# Patient Record
Sex: Male | Born: 1935
Health system: Southern US, Community
[De-identification: ages and names within clinical notes are randomized; demographics above are authoritative.]

## PROBLEM LIST (undated history)

## (undated) DIAGNOSIS — T7840XA Allergy, unspecified, initial encounter: Secondary | ICD-10-CM

## (undated) DIAGNOSIS — N183 Chronic kidney disease, stage 3 unspecified: Secondary | ICD-10-CM

## (undated) DIAGNOSIS — M1990 Unspecified osteoarthritis, unspecified site: Secondary | ICD-10-CM

## (undated) DIAGNOSIS — I499 Cardiac arrhythmia, unspecified: Secondary | ICD-10-CM

## (undated) DIAGNOSIS — K222 Esophageal obstruction: Secondary | ICD-10-CM

## (undated) DIAGNOSIS — I10 Essential (primary) hypertension: Secondary | ICD-10-CM

## (undated) DIAGNOSIS — D649 Anemia, unspecified: Secondary | ICD-10-CM

## (undated) DIAGNOSIS — H9190 Unspecified hearing loss, unspecified ear: Secondary | ICD-10-CM

## (undated) DIAGNOSIS — E785 Hyperlipidemia, unspecified: Secondary | ICD-10-CM

## (undated) DIAGNOSIS — IMO0002 Reserved for concepts with insufficient information to code with codable children: Secondary | ICD-10-CM

## (undated) DIAGNOSIS — Z8719 Personal history of other diseases of the digestive system: Secondary | ICD-10-CM

## (undated) DIAGNOSIS — C4491 Basal cell carcinoma of skin, unspecified: Secondary | ICD-10-CM

## (undated) DIAGNOSIS — K649 Unspecified hemorrhoids: Secondary | ICD-10-CM

## (undated) DIAGNOSIS — Z5189 Encounter for other specified aftercare: Secondary | ICD-10-CM

## (undated) DIAGNOSIS — I2510 Atherosclerotic heart disease of native coronary artery without angina pectoris: Secondary | ICD-10-CM

## (undated) DIAGNOSIS — J309 Allergic rhinitis, unspecified: Secondary | ICD-10-CM

## (undated) DIAGNOSIS — H5789 Other specified disorders of eye and adnexa: Secondary | ICD-10-CM

## (undated) DIAGNOSIS — R55 Syncope and collapse: Secondary | ICD-10-CM

## (undated) DIAGNOSIS — R011 Cardiac murmur, unspecified: Secondary | ICD-10-CM

## (undated) DIAGNOSIS — K8020 Calculus of gallbladder without cholecystitis without obstruction: Secondary | ICD-10-CM

## (undated) DIAGNOSIS — R112 Nausea with vomiting, unspecified: Secondary | ICD-10-CM

## (undated) DIAGNOSIS — Z936 Other artificial openings of urinary tract status: Secondary | ICD-10-CM

## (undated) DIAGNOSIS — I739 Peripheral vascular disease, unspecified: Secondary | ICD-10-CM

## (undated) DIAGNOSIS — I6529 Occlusion and stenosis of unspecified carotid artery: Secondary | ICD-10-CM

## (undated) DIAGNOSIS — T7500XA Unspecified effects of lightning, initial encounter: Secondary | ICD-10-CM

## (undated) DIAGNOSIS — K635 Polyp of colon: Secondary | ICD-10-CM

## (undated) DIAGNOSIS — C4490 Unspecified malignant neoplasm of skin, unspecified: Secondary | ICD-10-CM

## (undated) DIAGNOSIS — R946 Abnormal results of thyroid function studies: Secondary | ICD-10-CM

## (undated) DIAGNOSIS — C679 Malignant neoplasm of bladder, unspecified: Secondary | ICD-10-CM

## (undated) DIAGNOSIS — J302 Other seasonal allergic rhinitis: Secondary | ICD-10-CM

## (undated) DIAGNOSIS — H269 Unspecified cataract: Secondary | ICD-10-CM

## (undated) DIAGNOSIS — Z9889 Other specified postprocedural states: Secondary | ICD-10-CM

## (undated) DIAGNOSIS — I350 Nonrheumatic aortic (valve) stenosis: Secondary | ICD-10-CM

## (undated) DIAGNOSIS — K219 Gastro-esophageal reflux disease without esophagitis: Secondary | ICD-10-CM

## (undated) DIAGNOSIS — F419 Anxiety disorder, unspecified: Secondary | ICD-10-CM

## (undated) DIAGNOSIS — R42 Dizziness and giddiness: Secondary | ICD-10-CM

## (undated) DIAGNOSIS — M86171 Other acute osteomyelitis, right ankle and foot: Secondary | ICD-10-CM

## (undated) DIAGNOSIS — N400 Enlarged prostate without lower urinary tract symptoms: Secondary | ICD-10-CM

## (undated) HISTORY — DX: Esophageal obstruction: K22.2

## (undated) HISTORY — DX: Unspecified hearing loss, unspecified ear: H91.90

## (undated) HISTORY — DX: Allergic rhinitis, unspecified: J30.9

## (undated) HISTORY — PX: PROSTATE SURGERY: SHX751

## (undated) HISTORY — DX: Reserved for concepts with insufficient information to code with codable children: IMO0002

## (undated) HISTORY — DX: Unspecified osteoarthritis, unspecified site: M19.90

## (undated) HISTORY — DX: Encounter for other specified aftercare: Z51.89

## (undated) HISTORY — DX: Occlusion and stenosis of unspecified carotid artery: I65.29

## (undated) HISTORY — DX: Peripheral vascular disease, unspecified: I73.9

## (undated) HISTORY — DX: Essential (primary) hypertension: I10

## (undated) HISTORY — DX: Benign prostatic hyperplasia without lower urinary tract symptoms: N40.0

## (undated) HISTORY — DX: Allergy, unspecified, initial encounter: T78.40XA

## (undated) HISTORY — DX: Gastro-esophageal reflux disease without esophagitis: K21.9

## (undated) HISTORY — DX: Abnormal results of thyroid function studies: R94.6

## (undated) HISTORY — DX: Calculus of gallbladder without cholecystitis without obstruction: K80.20

## (undated) HISTORY — DX: Hyperlipidemia, unspecified: E78.5

## (undated) HISTORY — DX: Unspecified effects of lightning, initial encounter: T75.00XA

## (undated) HISTORY — DX: Anxiety disorder, unspecified: F41.9

## (undated) HISTORY — PX: SKIN CANCER EXCISION: SHX779

## (undated) HISTORY — PX: COLONOSCOPY W/ BIOPSIES: SHX1374

## (undated) HISTORY — DX: Basal cell carcinoma of skin, unspecified: C44.91

## (undated) HISTORY — PX: ESOPHAGOGASTRODUODENOSCOPY: SHX1529

## (undated) HISTORY — DX: Polyp of colon: K63.5

---

## 1979-04-06 HISTORY — PX: BACK SURGERY: SHX140

## 1981-04-05 HISTORY — PX: CORONARY ARTERY BYPASS GRAFT: SHX141

## 1997-09-25 ENCOUNTER — Other Ambulatory Visit: Admission: RE | Admit: 1997-09-25 | Discharge: 1997-09-25 | Payer: Self-pay | Admitting: Urology

## 1998-11-04 DIAGNOSIS — T7500XA Unspecified effects of lightning, initial encounter: Secondary | ICD-10-CM

## 1998-11-04 HISTORY — DX: Unspecified effects of lightning, initial encounter: T75.00XA

## 1999-11-04 HISTORY — PX: TRANSURETHRAL NEEDLE ABLATION OF THE PROSTATE: SHX2574

## 1999-11-12 ENCOUNTER — Ambulatory Visit (HOSPITAL_BASED_OUTPATIENT_CLINIC_OR_DEPARTMENT_OTHER): Admission: RE | Admit: 1999-11-12 | Discharge: 1999-11-13 | Payer: Self-pay | Admitting: Urology

## 2002-09-04 ENCOUNTER — Encounter: Payer: Self-pay | Admitting: Family Medicine

## 2002-09-04 LAB — CONVERTED CEMR LAB: PSA: 1 ng/mL

## 2003-10-04 ENCOUNTER — Encounter: Payer: Self-pay | Admitting: Family Medicine

## 2003-10-04 LAB — CONVERTED CEMR LAB: PSA: 0.9 ng/mL

## 2004-03-04 ENCOUNTER — Ambulatory Visit: Payer: Self-pay | Admitting: Family Medicine

## 2004-04-16 ENCOUNTER — Ambulatory Visit: Payer: Self-pay | Admitting: Family Medicine

## 2004-10-03 ENCOUNTER — Encounter: Payer: Self-pay | Admitting: Family Medicine

## 2004-10-03 LAB — CONVERTED CEMR LAB: PSA: 0.93 ng/mL

## 2004-10-13 ENCOUNTER — Ambulatory Visit: Payer: Self-pay | Admitting: Family Medicine

## 2004-10-16 ENCOUNTER — Ambulatory Visit: Payer: Self-pay | Admitting: Family Medicine

## 2004-10-27 ENCOUNTER — Ambulatory Visit: Payer: Self-pay | Admitting: Family Medicine

## 2005-02-04 ENCOUNTER — Ambulatory Visit: Payer: Self-pay | Admitting: Family Medicine

## 2005-04-20 ENCOUNTER — Ambulatory Visit: Payer: Self-pay | Admitting: Family Medicine

## 2005-06-01 ENCOUNTER — Ambulatory Visit: Payer: Self-pay | Admitting: Family Medicine

## 2005-06-17 ENCOUNTER — Ambulatory Visit: Payer: Self-pay | Admitting: Family Medicine

## 2005-07-27 ENCOUNTER — Ambulatory Visit: Payer: Self-pay | Admitting: Family Medicine

## 2005-11-22 ENCOUNTER — Ambulatory Visit: Payer: Self-pay | Admitting: Family Medicine

## 2005-11-24 ENCOUNTER — Ambulatory Visit: Payer: Self-pay | Admitting: Family Medicine

## 2005-12-09 ENCOUNTER — Ambulatory Visit: Payer: Self-pay | Admitting: Family Medicine

## 2006-01-04 ENCOUNTER — Ambulatory Visit: Payer: Self-pay | Admitting: Family Medicine

## 2006-05-16 ENCOUNTER — Ambulatory Visit: Payer: Self-pay | Admitting: Family Medicine

## 2006-05-16 LAB — CONVERTED CEMR LAB
ALT: 15 units/L (ref 0–40)
AST: 26 units/L (ref 0–37)
Alkaline Phosphatase: 48 units/L (ref 39–117)
Basophils Relative: 0.5 % (ref 0.0–1.0)
Bilirubin, Direct: 0.1 mg/dL (ref 0.0–0.3)
CO2: 29 meq/L (ref 19–32)
Calcium: 9.2 mg/dL (ref 8.4–10.5)
Chloride: 100 meq/L (ref 96–112)
Eosinophils Absolute: 0.3 10*3/uL (ref 0.0–0.6)
Eosinophils Relative: 4.6 % (ref 0.0–5.0)
GFR calc non Af Amer: 49 mL/min
Glucose, Bld: 103 mg/dL — ABNORMAL HIGH (ref 70–99)
Platelets: 257 10*3/uL (ref 150–400)
RBC: 5.11 M/uL (ref 4.22–5.81)
WBC: 6.9 10*3/uL (ref 4.5–10.5)

## 2006-05-23 ENCOUNTER — Ambulatory Visit: Payer: Self-pay | Admitting: Family Medicine

## 2006-05-30 ENCOUNTER — Ambulatory Visit: Payer: Self-pay | Admitting: Cardiology

## 2006-06-07 ENCOUNTER — Ambulatory Visit: Payer: Self-pay

## 2006-06-07 LAB — CONVERTED CEMR LAB
ALT: 18 units/L (ref 0–40)
Albumin: 4.1 g/dL (ref 3.5–5.2)
Alkaline Phosphatase: 50 units/L (ref 39–117)
Total CHOL/HDL Ratio: 3.9
Total Protein: 7 g/dL (ref 6.0–8.3)

## 2006-06-28 ENCOUNTER — Ambulatory Visit: Payer: Self-pay | Admitting: Cardiology

## 2006-06-29 ENCOUNTER — Ambulatory Visit: Payer: Self-pay | Admitting: Family Medicine

## 2006-07-05 ENCOUNTER — Ambulatory Visit: Payer: Self-pay | Admitting: Cardiology

## 2006-08-23 ENCOUNTER — Ambulatory Visit: Payer: Self-pay | Admitting: Family Medicine

## 2006-08-23 DIAGNOSIS — I25810 Atherosclerosis of coronary artery bypass graft(s) without angina pectoris: Secondary | ICD-10-CM

## 2006-08-23 DIAGNOSIS — I6529 Occlusion and stenosis of unspecified carotid artery: Secondary | ICD-10-CM

## 2006-11-22 ENCOUNTER — Encounter: Payer: Self-pay | Admitting: Family Medicine

## 2006-11-22 DIAGNOSIS — R7309 Other abnormal glucose: Secondary | ICD-10-CM

## 2006-11-22 DIAGNOSIS — J309 Allergic rhinitis, unspecified: Secondary | ICD-10-CM | POA: Insufficient documentation

## 2006-11-22 DIAGNOSIS — I10 Essential (primary) hypertension: Secondary | ICD-10-CM

## 2006-11-22 DIAGNOSIS — F411 Generalized anxiety disorder: Secondary | ICD-10-CM | POA: Insufficient documentation

## 2006-11-22 DIAGNOSIS — K8020 Calculus of gallbladder without cholecystitis without obstruction: Secondary | ICD-10-CM | POA: Insufficient documentation

## 2006-11-22 DIAGNOSIS — IMO0002 Reserved for concepts with insufficient information to code with codable children: Secondary | ICD-10-CM

## 2006-11-22 DIAGNOSIS — N400 Enlarged prostate without lower urinary tract symptoms: Secondary | ICD-10-CM

## 2006-11-22 DIAGNOSIS — E785 Hyperlipidemia, unspecified: Secondary | ICD-10-CM

## 2006-11-30 ENCOUNTER — Ambulatory Visit: Payer: Self-pay | Admitting: Family Medicine

## 2006-12-01 LAB — CONVERTED CEMR LAB
ALT: 17 units/L (ref 0–53)
Alkaline Phosphatase: 44 units/L (ref 39–117)
BUN: 15 mg/dL (ref 6–23)
Bilirubin, Direct: 0.1 mg/dL (ref 0.0–0.3)
Calcium: 9.8 mg/dL (ref 8.4–10.5)
Cholesterol: 175 mg/dL (ref 0–200)
Eosinophils Absolute: 0.3 10*3/uL (ref 0.0–0.6)
GFR calc Af Amer: 55 mL/min
GFR calc non Af Amer: 45 mL/min
HDL: 40.3 mg/dL (ref >39.0)
Hemoglobin: 15 g/dL (ref 13.0–17.0)
Lymphocytes Relative: 28 % (ref 12.0–46.0)
MCHC: 33.8 g/dL (ref 30.0–36.0)
MCV: 88.7 fL (ref 78.0–100.0)
Microalb Creat Ratio: 2.8 mg/g (ref 0.0–30.0)
Microalb, Ur: 0.3 mg/dL (ref 0.0–1.9)
Monocytes Absolute: 0.6 10*3/uL (ref 0.2–0.7)
Monocytes Relative: 8.9 % (ref 3.0–11.0)
Neutro Abs: 3.6 10*3/uL (ref 1.4–7.7)
PSA: 1.21 ng/mL (ref 0.10–4.00)
Platelets: 263 10*3/uL (ref 150–400)
Potassium: 4.7 meq/L (ref 3.5–5.1)
TSH: 3.96 microintl units/mL (ref 0.35–5.50)
Total Protein: 7.1 g/dL (ref 6.0–8.3)
VLDL: 71 mg/dL — ABNORMAL HIGH (ref 0–40)

## 2006-12-02 ENCOUNTER — Ambulatory Visit: Payer: Self-pay | Admitting: Family Medicine

## 2006-12-15 ENCOUNTER — Ambulatory Visit: Payer: Self-pay | Admitting: Family Medicine

## 2006-12-28 ENCOUNTER — Ambulatory Visit: Payer: Self-pay | Admitting: Family Medicine

## 2007-01-02 ENCOUNTER — Encounter (INDEPENDENT_AMBULATORY_CARE_PROVIDER_SITE_OTHER): Payer: Self-pay | Admitting: *Deleted

## 2007-01-03 ENCOUNTER — Ambulatory Visit: Payer: Self-pay

## 2007-01-03 ENCOUNTER — Encounter: Payer: Self-pay | Admitting: Family Medicine

## 2007-01-20 ENCOUNTER — Ambulatory Visit: Payer: Self-pay | Admitting: Family Medicine

## 2007-01-23 ENCOUNTER — Ambulatory Visit: Payer: Self-pay | Admitting: Cardiology

## 2007-01-30 ENCOUNTER — Telehealth: Payer: Self-pay | Admitting: Family Medicine

## 2007-02-24 ENCOUNTER — Telehealth (INDEPENDENT_AMBULATORY_CARE_PROVIDER_SITE_OTHER): Payer: Self-pay | Admitting: *Deleted

## 2007-03-01 ENCOUNTER — Encounter: Payer: Self-pay | Admitting: Family Medicine

## 2007-05-11 ENCOUNTER — Ambulatory Visit: Payer: Self-pay | Admitting: Family Medicine

## 2007-05-11 LAB — CONVERTED CEMR LAB
BUN: 23 mg/dL (ref 6–23)
CO2: 29 meq/L (ref 19–32)
Creatinine, Ser: 1.5 mg/dL (ref 0.4–1.5)
GFR calc Af Amer: 59 mL/min
Potassium: 4.3 meq/L (ref 3.5–5.1)
Sodium: 139 meq/L (ref 135–145)

## 2007-05-15 ENCOUNTER — Ambulatory Visit: Payer: Self-pay | Admitting: Family Medicine

## 2007-07-11 ENCOUNTER — Ambulatory Visit: Payer: Self-pay

## 2007-07-28 ENCOUNTER — Ambulatory Visit: Payer: Self-pay | Admitting: Cardiology

## 2007-08-09 ENCOUNTER — Ambulatory Visit: Payer: Self-pay

## 2007-08-09 ENCOUNTER — Encounter: Payer: Self-pay | Admitting: Family Medicine

## 2007-11-16 ENCOUNTER — Ambulatory Visit: Payer: Self-pay | Admitting: Family Medicine

## 2007-11-18 LAB — CONVERTED CEMR LAB
ALT: 19 units/L (ref 0–53)
AST: 30 units/L (ref 0–37)
Basophils Absolute: 0 10*3/uL (ref 0.0–0.1)
Bilirubin, Direct: 0.1 mg/dL (ref 0.0–0.3)
CO2: 30 meq/L (ref 19–32)
Chloride: 105 meq/L (ref 96–112)
Eosinophils Relative: 4.5 % (ref 0.0–5.0)
HCT: 43.3 % (ref 39.0–52.0)
Hemoglobin: 14.8 g/dL (ref 13.0–17.0)
MCV: 91 fL (ref 78.0–100.0)
Monocytes Relative: 7.5 % (ref 3.0–12.0)
Potassium: 4.5 meq/L (ref 3.5–5.1)
Sodium: 141 meq/L (ref 135–145)
Total Bilirubin: 0.9 mg/dL (ref 0.3–1.2)
Total CHOL/HDL Ratio: 3.8
Triglycerides: 256 mg/dL (ref 0–149)
WBC: 6.9 10*3/uL (ref 4.5–10.5)

## 2007-11-20 ENCOUNTER — Telehealth: Payer: Self-pay | Admitting: Family Medicine

## 2007-11-21 ENCOUNTER — Ambulatory Visit: Payer: Self-pay | Admitting: Family Medicine

## 2007-11-30 ENCOUNTER — Ambulatory Visit: Payer: Self-pay | Admitting: Family Medicine

## 2007-11-30 LAB — CONVERTED CEMR LAB: OCCULT 3: NEGATIVE

## 2007-12-04 ENCOUNTER — Encounter (INDEPENDENT_AMBULATORY_CARE_PROVIDER_SITE_OTHER): Payer: Self-pay | Admitting: *Deleted

## 2008-01-08 ENCOUNTER — Ambulatory Visit: Payer: Self-pay | Admitting: Family Medicine

## 2008-02-07 ENCOUNTER — Encounter: Payer: Self-pay | Admitting: Family Medicine

## 2008-02-07 ENCOUNTER — Ambulatory Visit: Payer: Self-pay

## 2008-05-28 ENCOUNTER — Ambulatory Visit: Payer: Self-pay | Admitting: Family Medicine

## 2008-08-03 DIAGNOSIS — M129 Arthropathy, unspecified: Secondary | ICD-10-CM | POA: Insufficient documentation

## 2008-08-03 DIAGNOSIS — R011 Cardiac murmur, unspecified: Secondary | ICD-10-CM

## 2008-08-07 ENCOUNTER — Telehealth: Payer: Self-pay | Admitting: Cardiology

## 2008-08-13 ENCOUNTER — Ambulatory Visit: Payer: Self-pay | Admitting: Cardiology

## 2008-08-29 ENCOUNTER — Telehealth (INDEPENDENT_AMBULATORY_CARE_PROVIDER_SITE_OTHER): Payer: Self-pay

## 2008-09-03 ENCOUNTER — Ambulatory Visit: Payer: Self-pay

## 2008-09-03 ENCOUNTER — Encounter: Payer: Self-pay | Admitting: Cardiology

## 2008-09-05 ENCOUNTER — Ambulatory Visit: Payer: Self-pay | Admitting: Cardiology

## 2008-10-09 ENCOUNTER — Ambulatory Visit: Payer: Self-pay | Admitting: Cardiology

## 2008-10-11 LAB — CONVERTED CEMR LAB
Albumin: 3.9 g/dL (ref 3.5–5.2)
Cholesterol: 166 mg/dL (ref 0–200)
HDL: 54.6 mg/dL (ref >39.00)
Total CHOL/HDL Ratio: 3
Total Protein: 6.8 g/dL (ref 6.0–8.3)
VLDL: 50 mg/dL — ABNORMAL HIGH (ref 0.0–40.0)

## 2008-11-20 ENCOUNTER — Ambulatory Visit: Payer: Self-pay | Admitting: Family Medicine

## 2008-11-20 LAB — CONVERTED CEMR LAB
Albumin: 4.1 g/dL (ref 3.5–5.2)
Basophils Absolute: 0 10*3/uL (ref 0.0–0.1)
Basophils Relative: 0.3 % (ref 0.0–3.0)
Cholesterol: 169 mg/dL (ref 0–200)
GFR calc non Af Amer: 41.91 mL/min (ref >60)
HDL: 52.1 mg/dL (ref >39.00)
Hemoglobin: 14.4 g/dL (ref 13.0–17.0)
Lymphocytes Relative: 32.1 % (ref 12.0–46.0)
Microalb Creat Ratio: 3.9 mg/g (ref 0.0–30.0)
Monocytes Relative: 8.5 % (ref 3.0–12.0)
Neutro Abs: 3.3 10*3/uL (ref 1.4–7.7)
PSA: 1.02 ng/mL (ref 0.10–4.00)
Potassium: 4.2 meq/L (ref 3.5–5.1)
RBC: 4.69 M/uL (ref 4.22–5.81)
RDW: 12.9 % (ref 11.5–14.6)
Sodium: 141 meq/L (ref 135–145)
Total Protein: 6.9 g/dL (ref 6.0–8.3)
Triglycerides: 237 mg/dL — ABNORMAL HIGH (ref 0.0–149.0)
VLDL: 47.4 mg/dL — ABNORMAL HIGH (ref 0.0–40.0)
WBC: 6.1 10*3/uL (ref 4.5–10.5)

## 2008-11-21 LAB — CONVERTED CEMR LAB: Vit D, 25-Hydroxy: 17 ng/mL — ABNORMAL LOW (ref 30–89)

## 2008-11-26 ENCOUNTER — Ambulatory Visit: Payer: Self-pay | Admitting: Family Medicine

## 2008-11-26 DIAGNOSIS — H9190 Unspecified hearing loss, unspecified ear: Secondary | ICD-10-CM | POA: Insufficient documentation

## 2008-11-26 DIAGNOSIS — E559 Vitamin D deficiency, unspecified: Secondary | ICD-10-CM | POA: Insufficient documentation

## 2008-12-23 ENCOUNTER — Ambulatory Visit: Payer: Self-pay | Admitting: Family Medicine

## 2008-12-24 ENCOUNTER — Encounter (INDEPENDENT_AMBULATORY_CARE_PROVIDER_SITE_OTHER): Payer: Self-pay | Admitting: *Deleted

## 2008-12-30 ENCOUNTER — Encounter (INDEPENDENT_AMBULATORY_CARE_PROVIDER_SITE_OTHER): Payer: Self-pay | Admitting: *Deleted

## 2009-01-07 ENCOUNTER — Ambulatory Visit: Payer: Self-pay | Admitting: Family Medicine

## 2009-02-20 ENCOUNTER — Encounter (INDEPENDENT_AMBULATORY_CARE_PROVIDER_SITE_OTHER): Payer: Self-pay | Admitting: *Deleted

## 2009-02-25 ENCOUNTER — Ambulatory Visit: Payer: Self-pay | Admitting: Family Medicine

## 2009-02-26 LAB — CONVERTED CEMR LAB: Vit D, 25-Hydroxy: 50 ng/mL (ref 30–89)

## 2009-03-04 ENCOUNTER — Ambulatory Visit: Payer: Self-pay | Admitting: Family Medicine

## 2009-03-21 ENCOUNTER — Ambulatory Visit: Payer: Self-pay | Admitting: Cardiology

## 2009-03-25 ENCOUNTER — Encounter: Payer: Self-pay | Admitting: Cardiology

## 2009-03-26 ENCOUNTER — Encounter: Payer: Self-pay | Admitting: Cardiovascular Disease

## 2009-03-26 ENCOUNTER — Ambulatory Visit: Payer: Self-pay

## 2009-08-06 ENCOUNTER — Ambulatory Visit: Payer: Self-pay | Admitting: Family Medicine

## 2009-08-07 LAB — CONVERTED CEMR LAB: Vit D, 25-Hydroxy: 44 ng/mL (ref 30–89)

## 2009-08-11 ENCOUNTER — Ambulatory Visit: Payer: Self-pay | Admitting: Family Medicine

## 2009-08-11 DIAGNOSIS — R42 Dizziness and giddiness: Secondary | ICD-10-CM | POA: Insufficient documentation

## 2009-08-20 ENCOUNTER — Telehealth: Payer: Self-pay | Admitting: Family Medicine

## 2009-09-29 ENCOUNTER — Encounter: Payer: Self-pay | Admitting: Cardiology

## 2009-09-30 ENCOUNTER — Encounter: Payer: Self-pay | Admitting: Cardiology

## 2009-09-30 ENCOUNTER — Ambulatory Visit: Payer: Self-pay

## 2009-10-08 ENCOUNTER — Telehealth: Payer: Self-pay | Admitting: Family Medicine

## 2009-11-10 ENCOUNTER — Encounter (INDEPENDENT_AMBULATORY_CARE_PROVIDER_SITE_OTHER): Payer: Self-pay | Admitting: *Deleted

## 2010-01-13 ENCOUNTER — Ambulatory Visit: Payer: Self-pay | Admitting: Family Medicine

## 2010-01-20 ENCOUNTER — Ambulatory Visit: Payer: Self-pay | Admitting: Family Medicine

## 2010-01-27 ENCOUNTER — Ambulatory Visit: Payer: Self-pay | Admitting: Family Medicine

## 2010-01-28 DIAGNOSIS — R195 Other fecal abnormalities: Secondary | ICD-10-CM

## 2010-01-29 ENCOUNTER — Encounter: Payer: Self-pay | Admitting: Internal Medicine

## 2010-02-18 ENCOUNTER — Ambulatory Visit: Payer: Self-pay | Admitting: Internal Medicine

## 2010-02-18 ENCOUNTER — Encounter (INDEPENDENT_AMBULATORY_CARE_PROVIDER_SITE_OTHER): Payer: Self-pay | Admitting: *Deleted

## 2010-02-18 DIAGNOSIS — R3129 Other microscopic hematuria: Secondary | ICD-10-CM

## 2010-02-18 DIAGNOSIS — R1319 Other dysphagia: Secondary | ICD-10-CM

## 2010-02-23 ENCOUNTER — Ambulatory Visit: Payer: Self-pay | Admitting: Internal Medicine

## 2010-02-26 ENCOUNTER — Encounter: Payer: Self-pay | Admitting: Internal Medicine

## 2010-03-03 ENCOUNTER — Ambulatory Visit: Payer: Self-pay | Admitting: Family Medicine

## 2010-03-05 LAB — CONVERTED CEMR LAB
AST: 25 units/L (ref 0–37)
Alkaline Phosphatase: 58 units/L (ref 39–117)
Bilirubin, Direct: 0.1 mg/dL (ref 0.0–0.3)
Direct LDL: 101.6 mg/dL
GFR calc non Af Amer: 41.21 mL/min (ref >60)
Glucose, Bld: 111 mg/dL — ABNORMAL HIGH (ref 70–99)
PSA: 1.12 ng/mL (ref 0.10–4.00)
Potassium: 4.6 meq/L (ref 3.5–5.1)
Sodium: 136 meq/L (ref 135–145)
TSH: 5.72 microintl units/mL — ABNORMAL HIGH (ref 0.35–5.50)
Total Bilirubin: 0.6 mg/dL (ref 0.3–1.2)
Total CHOL/HDL Ratio: 4
VLDL: 99 mg/dL — ABNORMAL HIGH (ref 0.0–40.0)

## 2010-04-23 ENCOUNTER — Telehealth: Payer: Self-pay | Admitting: Internal Medicine

## 2010-05-05 NOTE — Letter (Signed)
Summary: New Patient letter  [facility]  [address] [place], [place] [address]   Phone: [phone]  Fax: [phone]       01/29/2010 MRN: [id number]  [facility] [address] [place], [place]  [address]  Dear Mr. Tuft,  Welcome to the Gastroenterology Division at [facility].    You are scheduled to see Dr.  Carlean Purl on 02-18-10 at 2:00pm on the 3rd floor at [facility], [place] [employer].  We ask that you try to arrive at our office 15 minutes prior to your appointment time to allow for check-in.  We would like you to complete the enclosed self-administered evaluation form prior to your visit and bring it with you on the day of your appointment.  We will review it with you.  Also, please bring a complete list of all your medications or, if you prefer, bring the medication bottles and we will list them.  Please bring your insurance card so that we may make a copy of it.  If your insurance requires a referral to see a specialist, please bring your referral form from your primary care physician.  Co-payments are due at the time of your visit and may be paid by cash, check or credit card.     Your office visit will consist of a consult with your physician (includes a physical exam), any laboratory testing he/she may order, scheduling of any necessary diagnostic testing (e.g. x-ray, ultrasound, CT-scan), and scheduling of a procedure (e.g. Endoscopy, Colonoscopy) if required.  Please allow enough time on your schedule to allow for any/all of these possibilities.    If you cannot keep your appointment, please call [phone] to cancel or reschedule prior to your appointment date.  This allows [place] the opportunity to schedule an appointment for another patient in need of care.  If you do not cancel or reschedule by 5 p.m. the business day prior to your appointment date, you will be charged a $50.00 late cancellation/no-show fee.    Thank you for choosing [facility] for your medical needs.  We appreciate the opportunity to care for you.  Please visit [place] at our website  to learn more about our practice.                     Sincerely,                                                             The Gastroenterology Division

## 2010-05-05 NOTE — Letter (Signed)
Summary: [facility]: Immunoassay Fecal Occult Blood (iFOB) Order Form  [facility]  [address] [place], [place] [address]   Phone: [phone]  Fax: [phone]      [facility]: Immunoassay Fecal Occult Blood (iFOB) Order Form   January 20, 2010 MRN: [id number]   [name] 1931/12/15   Physicican Name:________duncan________________  Diagnosis Code:__________v76.49________________      [name] MD

## 2010-05-05 NOTE — Letter (Signed)
Summary: [facility] Instructions  [facility]  [place], [place] [address]   Phone: [phone]  Fax: [place]    30-Aug-1931    MRN: [id number]        Procedure Day /Date: Monday 02/23/10     Arrival Time: 1:30 pm     Procedure Time: 2:30 pm     Location of Procedure:                    _x _  [place] (4th Floor)   [place]   Starting 5 days prior to your procedure TODAY do not eat nuts, seeds, popcorn, corn, beans, peas,  salads, or any raw vegetables.  Do not take any fiber supplements (e.g. Metamucil, Citrucel, and Benefiber).  THE DAY BEFORE YOUR PROCEDURE         DATE: 02/22/10  DAY: Sunday  1.  Drink clear liquids the entire day-NO SOLID FOOD  2.  Do not drink anything colored red or purple.  Avoid juices with pulp.  No orange juice.  3.  Drink at least 64 oz. (8 glasses) of fluid/clear liquids during the day to prevent dehydration and help the prep work efficiently.  CLEAR LIQUIDS INCLUDE: Water Jello Ice Popsicles Tea (sugar ok, no milk/cream) Powdered fruit flavored drinks Coffee (sugar ok, no milk/cream) Gatorade Juice: apple, white grape, white cranberry  Lemonade Clear bullion, consomm, broth Carbonated beverages (any kind) Strained chicken noodle soup Hard Candy                             4.  In the morning, mix first dose of MoviPrep solution:    Empty 1 Pouch A and 1 Pouch B into the disposable container    Add lukewarm drinking water to the top line of the container. Mix to dissolve    Refrigerate (mixed solution should be used within 24 hrs)  5.  Begin drinking the prep at 5:00 p.m. The MoviPrep container is divided by 4 marks.   Every 15 minutes drink the solution down to the next mark (approximately 8 oz) until the full liter is complete.   6.  Follow completed prep with 16 oz of clear liquid of your choice (Nothing red or purple).  Continue to  drink clear liquids until bedtime.  7.  Before going to bed, mix second dose of MoviPrep solution:    Empty 1 Pouch A and 1 Pouch B into the disposable container    Add lukewarm drinking water to the top line of the container. Mix to dissolve    Refrigerate  THE DAY OF YOUR PROCEDURE      DATE: 02/23/10 DAY: Monday  Beginning at 9:30 a.m. (5 hours before procedure):         1. Every 15 minutes, drink the solution down to the next mark (approx 8 oz) until the full liter is complete.  2. Follow completed prep with 16 oz. of clear liquid of your choice.    3. You may drink clear liquids until 12:30 pm (2 HOURS BEFORE PROCEDURE).   MEDICATION INSTRUCTIONS  Unless otherwise instructed, you should take regular prescription medications with a small sip of water   as early as possible the morning of your procedure.        OTHER INSTRUCTIONS  You will need a responsible adult at least 75  years of age to accompany you and drive you home.   This person must remain in the waiting room during your procedure.  Wear loose fitting clothing that is easily removed.  Leave jewelry and other valuables at home.  However, you may wish to bring a book to read or  an iPod/MP3 player to listen to music as you wait for your procedure to start.  Remove all body piercing jewelry and leave at home.  Total time from sign-in until discharge is approximately 2-3 hours.  You should go home directly after your procedure and rest.  You can resume normal activities the  day after your procedure.  The day of your procedure you should not:   Drive   Make legal decisions   Operate machinery   Drink alcohol   Return to work  You will receive specific instructions about eating, activities and medications before you leave.    The above instructions have been reviewed and explained to me by   _______________________    I fully understand and can verbalize these instructions  _____________________________ Date _________

## 2010-05-05 NOTE — Assessment & Plan Note (Signed)
Summary: FLU SHOT/CLE   Nurse Visit   Allergies: 1)  ! Lotensin  Immunizations Administered:  Influenza Vaccine # 1:    Vaccine Type: Fluvax MCR    Site: left deltoid    Mfr: GlaxoSmithKline    Dose: 0.5 ml    Route: IM    Given by: Sherrian Divers CMA ([place])    Exp. Date: 10/03/2010    Lot #: [id number]    VIS given: 10/28/09 version given January 13, 2010.  Flu Vaccine Consent Questions:    Do you have a history of severe allergic reactions to this vaccine? no    Any prior history of allergic reactions to egg and/or gelatin? no    Do you have a sensitivity to the preservative Thimersol? no    Do you have a past history of Guillan-Barre Syndrome? no    Do you currently have an acute febrile illness? no    Have you ever had a severe reaction to latex? no    Vaccine information given and explained to patient? yes  Orders Added: 1)  Flu Vaccine 15yrs + MEDICARE PATIENTS [Q2039] 2)  Administration Flu vaccine - MCR [id number]
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
## 2010-05-05 NOTE — Assessment & Plan Note (Signed)
Summary: 60 M F/U ,URI DLO   Vital Signs:  Patient profile:   77 year old male Weight:      178.50 pounds O2 Sat:      96 % on Room air Temp:     101.1 degrees F oral Pulse rate:   84 / minute Pulse rhythm:   regular Resp:     20 per minute BP sitting:   158 / 80  (left arm) Cuff size:   regular  Vitals Entered By: Emelia Salisbury LPN (May  9, 624THL 624THL AM)  O2 Flow:  Room air CC: 6 Month follow-up, sore throat, headache, head and chest congesiton, productive cough brownish/green and fever   History of Present Illness: Pt here with wife for 6 month followup. He started OTC Vit D then and is back for recheck. He is also here for acute illness with chest congestion , ST and cough. Started Thu and wotrsened over the weekend. He haqs had fever to 101, headache occipitqlly, no ear pain, some dizziness Fri AM, rhinits that is brown and green, ST extremely began Fri and still there, cough productive brown and green, mild SOB at worst,  no N/V. No apptetite. He is sleeping because wakes up a lot. No abd pain or upset, BMs nml. He is achey. He is taking advil. He has had no known exposure.  Problems Prior to Update: 1)  Special Screening Malig Neoplasms Other Sites  (ICD-V76.49) 2)  Unspecified Vitamin D Deficiency  (ICD-268.9) 3)  Cardiovascular Function Study, Abnormal  (ICD-794.30) 4)  Angina Pectoris  (ICD-413.9) 5)  Hypertension, Benign Essential  (ICD-401.1) 6)  Carotid Artery Stenosis  (ICD-433.10) 7)  Peripheral Vascular Disease  (ICD-443.9) 8)  Murmur  (ICD-785.2) 9)  Cad  (ICD-414.00) 10)  Herniated Disc, L4  (ICD-722.2) 11)  Hyperglycemia  (ICD-790.29) 12)  Anxiety  (ICD-300.00) 13)  Allergic Rhinitis, Freq Sinusitis  (ICD-477.9) 14)  Hearing Loss Via Audiology  (ICD-389.9) 15)  Benign Prostatic Hypertrophy  (ICD-600.00) 16)  Cholelithiasis  (ICD-574.20) 17)  Microscopic Hematuria, (TANNENBAUM)  (ICD-599.7) 18)  Hypercholesterolemia/trig (541/667)  (ICD-272.0) 19)   Atherosclerosis, Coronary, Artery Bypass Grft  (ICD-414.04) 20)  Arthritis  (ICD-716.90) 21)  Special Screening Malignant Neoplasm of Prostate  (ICD-V76.44) 22)  Renal Insufficiency  (ICD-588.9)  Medications Prior to Update: 1)  Fluticasone Propionate 50 Mcg/act Susp (Fluticasone Propionate) .... Spray 1 Spray Into Both Nostrils Once A Day 2)  Meclizine Hcl 25 Mg Tabs (Meclizine Hcl) .... Take 1 Tablet By Mouth Every Six Hours 3)  Allegra 180 Mg Tabs (Fexofenadine Hcl) .[name].. 1 To 1/2 Tab Once Daily 4)  Aspirin Ec 81 Mg Tbec (Aspirin) .... Take 1 Tablet By Mouth Once A Day 5)  Crestor 40 Mg Tabs (Rosuvastatin Calcium) .... Take One Tablet By Mouth At Bedtime 6)  Sertraline Hcl 50 Mg Tabs (Sertraline Hcl) .... Take 1 Tablet By Mouth Once A Day 7)  Fish Oil 1000 Mg  Caps (Omega-3 Fatty Acids) .... Take 2 By Mouth Once A Day 8)  Lisinopril 20 Mg  Tabs (Lisinopril) .[name].. 1 Daily By Mouth 9)  Amlodipine Besylate 5 Mg Tabs (Amlodipine Besylate) .... Take One Tablet By Mouth Daily 10)  Vitamin D (Ergocalciferol) 50000 Unit Caps (Ergocalciferol) .... One Tab By Mouth Once A Week.,  Allergies: 1)  ! Lotensin  Physical Exam  General:  Well-developed,well-nourished,in no acute distress; alert,appropriate and cooperative throughout examination, congested. Head:  Normocephalic and atraumatic without obvious abnormalities. No apparent alopecia or balding. Mild  thinning of the hair. Sinuses NT. Eyes:  Conjunctiva clear bilaterally.  Ears:  External ear exam shows no significant lesions or deformities.  Otoscopic examination reveals clear canals, tympanic membranes are intact bilaterally without bulging, retraction, inflammation or discharge. Hearing is grossly normal bilaterally. Nose:  Mildly inflamed. R>L. Thick, clear discharge. Mouth:  Oral mucosa and oropharynx without lesions or exudates.  Teeth in good repair. Mild thick clear PND. Neck:  neck is supple with no bruits noted. No thyromegaly. Lungs:   Normal respiratory effort, chest expands symmetrically. Lungs are clear to auscultation, no crackles or wheezes. Heart:  regular rate and rhythm   Impression & Recommendations:  Problem # 1:  UNSPECIFIED VITAMIN D DEFICIENCY (ICD-268.9) Assessment Improved Therapeutic on OTC repl....cont.  Problem # 2:  URI (ICD-465.9) Assessment: New  See instructuions. His updated medication list for this problem includes:    Allegra 180 Mg Tabs (Fexofenadine hcl) .[name]... 1 to 1/2 tab once daily    Aspirin Ec 81 Mg Tbec (Aspirin) .[name]... Take 1 tablet by mouth once a day    Advil 200 Mg Tabs (Ibuprofen) .[name]... As needed    Tessalon 200 Mg Caps (Benzonatate) ..... One tab by mouth three times a day as needed for cough    Tussionex Pennkinetic Er 8-10 Mg/16ml Lqcr (Chlorpheniramine-hydrocodone) ..... One tsp by mouth at night as needed for cough.  Instructed on symptomatic treatment. Call if symptoms persist or worsen.   Problem # 3:  INTERMITTENT VERTIGO (ICD-780.4) Assessment: Unchanged Stable but recurs occas. Will call in more Meclizine. His updated medication list for this problem includes:    Meclizine Hcl 25 Mg Tabs (Meclizine hcl) .[name]... Take 1 tablet by mouth every six hours    Allegra 180 Mg Tabs (Fexofenadine hcl) .[name]... 1 to 1/2 tab once daily  Complete Medication List: 1)  Fluticasone Propionate 50 Mcg/act Susp (Fluticasone propionate) .... Spray 1 spray into both nostrils once a day 2)  Meclizine Hcl 25 Mg Tabs (Meclizine hcl) .... Take 1 tablet by mouth every six hours 3)  Allegra 180 Mg Tabs (Fexofenadine hcl) .[name].. 1 to 1/2 tab once daily 4)  Aspirin Ec 81 Mg Tbec (Aspirin) .... Take 1 tablet by mouth once a day 5)  Crestor 40 Mg Tabs (Rosuvastatin calcium) .... Take one tablet by mouth at bedtime 6)  Sertraline Hcl 50 Mg Tabs (Sertraline hcl) .... Take 1 tablet by mouth once a day 7)  Fish Oil 1000 Mg Caps (Omega-3 fatty acids) .... Take 2 by mouth once a day 8)  Lisinopril 20 Mg Tabs  (Lisinopril) .[name].. 1 daily by mouth 9)  Amlodipine Besylate 5 Mg Tabs (Amlodipine besylate) .... Take one tablet by mouth daily 10)  Vitamin D (ergocalciferol) 50000 Unit Caps (Ergocalciferol) .... One tab by mouth once a week., 11)  Advil 200 Mg Tabs (Ibuprofen) .... As needed 12)  Tessalon 200 Mg Caps (Benzonatate) .... One tab by mouth three times a day as needed for cough 13)  Tussionex Pennkinetic Er 8-10 Mg/47ml Lqcr (Chlorpheniramine-hydrocodone) .... One tsp by mouth at night as needed for cough.  Patient Instructions: 1)  Take Guaifenesin by [facility] or [facility] and getting MUCOUS RELIEF EXPECTORANT (400mg ), take 11/2 tabs by mouth AM and NOON. 2)  Drink lots of fluids anytime taking Guaifenesin.  3)  Take Tyl ES 2 tabs by mouth three times a day regularly.  4)  Tessalon one tab by mouth three times a day. 5)  Keep lozenge  in mouth while awake. 6)  Take Tussionex at night 7)  If no impr by Thu, call back. 8)  Call in Jun for Comp Exam appt in Oct/Nov Prescriptions: MECLIZINE HCL 25 MG TABS (MECLIZINE HCL) Take 1 tablet by mouth every six hours  #30 x 1   Entered and Authorized by:   Raenette Rover MD   Signed by:   Raenette Rover MD on 08/11/2009   Method used:   [facility]        [facility]  [address] [place] (retail[facility]       [place], [place]  [address]       Ph: [phone] or [phone]       Fax: [phone]   RxID:   [id number] MECLIZINE HCL 25 MG TABS (MECLIZINE HCL) Take 1 tablet by mouth every six hours  #30 x 1   Entered and Authorized by:   Raenette Rover MD   Signed by:   Raenette Rover MD on 08/11/2009   Method used:   Print then Give to Patient   RxID:   [id number] [place] ER 8-10 MG/5ML LQCR (CHLORPHENIRAMINE-HYDROCODONE) one tsp by mouth at night as needed for cough.  #8 oz x 0   Entered and Authorized by:   Raenette Rover MD   Signed by:   Raenette Rover MD on 08/11/2009   Method used:   Print then Give to Patient   RxID:   [id number] TESSALON 200 MG CAPS (BENZONATATE) one tab by mouth three times a day as needed for cough  #30 x 0   Entered and Authorized by:   Raenette Rover MD   Signed by:   Raenette Rover MD on 08/11/2009   Method used:   [facility] to        [facility]  [address] [place] (retail)       [place], [place]  [address]       Ph: [phone] or [phone]       Fax: [phone]   RxID:   [id number]   Current Allergies (reviewed today): ! LOTENSIN

## 2010-05-05 NOTE — Progress Notes (Signed)
Summary: Rx Sertraline  Phone Note Refill Request Call back at [phone] Message [facility]:  [facility] on October 08, 2009 11:14 AM  Refills Requested: Medication #1:  SERTRALINE HCL 50 MG TABS Take 1 tablet by mouth once a day  Method Requested: Electronic Initial call taken by: Emelia Salisbury LPN,  July  6, 624THL [id number] AM    Prescriptions: SERTRALINE HCL 50 MG TABS (SERTRALINE HCL) Take 1 tablet by mouth once a day  #31 x 5   Entered and Authorized by:   [name] MD   Signed by:   [name] MD on 10/08/2009   Method used:   Electronically [facility]        [facility] ([facility]ail)       [address]       [place], [place]  [address]       Ph: [phone] or [phone]       Fax: [phone]   RxID:   [id number]

## 2010-05-05 NOTE — Progress Notes (Signed)
Summary: upper lip is swollen  Phone Note Call from Patient Call back at Home Phone [phone]   Caller: Spouse Call For: Kenneth Rover MD Summary of Call: Pt was seen last week for cold sxs and was given tesselon, tussionex, meclizine. He has not had these meds since sunday.   He woke up with his upper lip swollen this morning, started on left side and has moved across his lip.  No other sxs.  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  Wife says his lip is really puffy.  Please advise. Initial call taken by: Marty Heck CMA,  Aug 20, 2009 10:47 AM  Follow-up for Phone Call        Do not think reaction to medication, probably a bite of some kind, being the upper lip. Apply ice 20 mins every hour and augment allegra with benadryl. Follow-up by: Kenneth Rover MD,  Aug 20, 2009 1:33 PM  Additional Follow-up for Phone Call Additional follow up Details #1::        Advised pt. Additional Follow-up by: Marty Heck CMA,  Aug 20, 2009 1:59 PM

## 2010-05-05 NOTE — Miscellaneous (Signed)
Summary: Orders Update  Clinical Lists Changes  Orders: Added new Test order of Carotid Duplex (Carotid Duplex) - Signed 

## 2010-05-05 NOTE — Letter (Signed)
Summary: Patient Notice- Polyp Results  [facility]  [address] [place], [place] [address]   Phone: [phone]  Fax: [phone]        February 26, 2010 MRN: [id number]    [place], [place]  [address]    Dear Mr. Grape,  I am pleased to inform you that the colon polyps removed during your recent colonoscopy were found to be benign (no cancer detected) upon pathologic examination.  I do not think you need another routine colonoscopy exam.  I hope you are swallowing better and I will see you in follow-up as recommended. Please continue treatment plan as outlined the day of your exam.  Please call us if you are having persistent problems or have questions about your condition that have not been fully answered at this time.  Sincerely,  Gatha Mayer MD, [facility]  This letter has been electronically signed by your physician.  Appended Document: Patient Notice- Polyp Results Letter mailed

## 2010-05-05 NOTE — Letter (Signed)
Summary: Anabel Halon letter  [place]  [address] [place], [place] [address]   Phone: [phone]  Fax: [phone]       11/10/2009 MRN: [id number]  [facility] [address] [place], [place]  [address]  Dear Mr. Hinderliter,  [place], and [place] announce the retirement of Modesto Charon, M.D., from full-time practice at the [facility] office effective October 02, 2009 and his plans of returning part-time.  It is important to Dr. Council Mechanic and to our practice that you understand that [facility] has seven physicians in our office for your health care needs.  We will continue to offer the same exceptional care that you have today.    Dr. Council Mechanic has spoken to many of you about his plans for retirement and returning part-time in the fall.   We will continue to work with you through the transition to schedule appointments for you in the office and meet the high standards that [facility] is committed to.   Again, it is with great pleasure that we share the news that Dr. Council Mechanic will return to [facility] in October of 2011 with a reduced schedule.    If you have any questions, or would like to request an appointment with one of our physicians, please call us at [phone] and press the option for Scheduling an appointment.  We take pleasure in providing you with excellent patient care and look forward to seeing you at your next office visit.  [facility] are:  Viviana Simpler, M.D. Teresa Pelton, M.D. Loura Pardon, M.D. Eliezer Lofts, M.D. Owens Loffler, M.D. Arnette Norris, M.D. We proudly welcomed Renford Dills, M.D. and Ria Bush, M.D. to the practice in July/August 2011.  Sincerely,   [facility]
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
## 2010-05-05 NOTE — Procedures (Signed)
Summary: Upper Endoscopy w/DIL  Patient: [name] Note: All result statuses are Final unless otherwise noted.  Tests: (1) Upper Endoscopy w/DIL (UED)  UED Upper Endoscopy w/DIL                             [facility]     [place], [place]  [address]           ENDOSCOPY PROCEDURE REPORT           PATIENT:  [name], [name]  MR#:  [id number]     BIRTHDATE:  1931-09-06, 79 yrs. old  GENDER:  male           ENDOSCOPIST:  Gatha Mayer, MD, [facility]           PROCEDURE DATE:  02/23/2010     PROCEDURE:  EGD with balloon dilatation     ASA CLASS:  Class II     INDICATIONS:  1) dysphagia           MEDICATIONS:   Fentanyl 50 mcg IV, Versed 4 mg     TOPICAL ANESTHETIC:  Exactacain Spray           DESCRIPTION OF PROCEDURE:   After the risks benefits and     alternatives of the procedure were thoroughly explained, informed     consent was obtained.  The LB [id number] endoscope was     introduced through the mouth and advanced to the second portion of     the duodenum, without limitations.  The instrument was slowly     withdrawn as the mucosa was carefully examined.     <<PROCEDUREIMAGES>>           A stricture was found in the distal esophagus. Benign-appearing     and mildly inflamed.  A hiatal hernia was found. It was 10 cm in     size. 30-40 cm  An erosion was found in the body of the stomach.     At diaphragmatic impingement.  The examination was otherwise     normal.    Dilation was then performed at the distal esophagus           1) Dilator:  Balloon  Size(s):  15, 16.5, 18 mm     Resistance:  moderate  Heme:  yes     Appearance:  satisfactory           COMPLICATIONS:  None           ENDOSCOPIC IMPRESSION:     1) Stricture in the distal esophagus - dilated to 18 mm     2) 10 cm hiatal hernia     3) Erosion in the body of the stomach - from hiatal     hernia/diaphragmatic impingement     4) Otherwise normal examination.  RECOMMENDATIONS:     Clear liquids until 6 PM then soft foods.     Normal foods tomorrow.     Could need a repeat dilation - he needs to stay on lansoprazole     every day.     He should see me again in 6-8 weeks - call now for an     appointment           REPEAT EXAM:  as needed           Gatha Mayer, MD, [facility]  CC:  [name], M.D.     The Patient           n.     eSIGNED:   Gatha Mayer at 02/23/2010 04:34 PM           [name], [id number]  Note: An exclamation mark (!) indicates a result that was not dispersed into the flowsheet. Document Creation Date: 02/23/2010 4:34 PM _______________________________________________________________________  (1) Order result status: Final Collection or observation date-time: 02/23/2010 16:02 Requested date-time:  Receipt date-time:  Reported date-time:  Referring Physician:   Ordering Physician: Silvano Rusk [id number]) Specimen Source:  Source: Tawanna Cooler Order Number: [id number] Lab site:   Appended Document: Upper Endoscopy w/DIL    Clinical Lists Changes       Appended Document: Upper Endoscopy w/DIL   EGD  Procedure date:  02/23/2010  Findings:      :     1) Stricture in the distal esophagus - dilated to 18 mm     2) 10 cm hiatal hernia     3) Erosion in the body of the stomach - from hiatal     hernia/diaphragmatic impingement     4) Otherwise normal examination.     RECOMMENDATIONS:     Clear liquids until 6 PM then soft foods.     Normal foods tomorrow.     Could need a repeat dilation - he needs to stay on lansoprazole     every day.     He should see me again in 6-8 weeks - call now for an     appointment   Appended Document: Colonoscopy   Colonoscopy  Procedure date:  02/23/2010  Findings:          1) Two polyps removed, largest 6 mm ADENOMA AND HYPERPLASTIC     2) Moderate diverticulosis in the sigmoid colon     3) Internal and external hemorrhoids     4) Otherwise  normal examination  Comments:      no recakll needed due to findings and age

## 2010-05-07 NOTE — Procedures (Signed)
Summary: Colonoscopy  Patient: [name] Note: All result statuses are Final unless otherwise noted.  Tests: (1) Colonoscopy (COL)   COL Colonoscopy           [facility].     [place], [place]  [address]           COLONOSCOPY PROCEDURE REPORT           PATIENT:  [name], [name]  MR#:  [id number]     BIRTHDATE:  [birth year], 79 yrs. old  GENDER:  male     ENDOSCOPIST:  Gatha Mayer, MD, [facility]     REF. BY:  [name], M.D.     PROCEDURE DATE:  02/23/2010     PROCEDURE:  Colonoscopy with snare polypectomy     ASA CLASS:  Class II     INDICATIONS:  heme positive stool     MEDICATIONS:   There was residual sedation effect present from     prior procedure., Fentanyl 25 mcg IV, Versed 4 mg           DESCRIPTION OF PROCEDURE:   After the risks benefits and     alternatives of the procedure were thoroughly explained, informed     consent was obtained.  Digital rectal exam was performed and     revealed prolasped hemorrhoids, no rectal masses and normal     prostate.   The LB 180AL [id number] endoscope was introduced through     the anus and advanced to the cecum, which was identified by both     the appendix and ileocecal valve, without limitations.  The     quality of the prep was excellent, using MoviPrep.  The instrument     was then slowly withdrawn as the colon was fully examined.     Insertion: 5:32 minutes Withdrawal: 9:18 minutes     <<PROCEDUREIMAGES>>           FINDINGS:  Two polyps were found. 2-3 mm cecal polyp and a 6 mm     rectal polyp. The cecal polyp was removed using cold biopsy     forceps. The rectal polyp was snared without cautery. Retrieval     was successful. Moderate diverticulosis was found in the sigmoid     colon.  This was otherwise a normal examination of the colon.     Retroflexed views in the rectum revealed internal and external     hemorrhoids.    The scope was then withdrawn from the patient and     the procedure  completed.           COMPLICATIONS:  None     ENDOSCOPIC IMPRESSION:     1) Two polyps removed, largest 6 mm     2) Moderate diverticulosis in the sigmoid colon     3) Internal and external hemorrhoids     4) Otherwise normal examination           REPEAT EXAM:  await path reports, may not need routine repeat     colonoscopy           Gatha Mayer, MD, [facility]           CC:  [name], MD     The Patient           n.     Lorrin Mais:   Gatha Mayer at 02/23/2010 04:41 PM  [place]arl[place], [place], [id number]  Note: An exclamation mark (!) indicates a result that was not dispersed into the flowsheet. Document Creation Date: 02/23/2010 4:41 PM _______________________________________________________________________  (1) Order result status: Final Collection or observation date-time: 02/23/2010 16:23 Requested date-time:  Receipt date-time:  Reported date-time:  Referring Physician:   Ordering Physician: Silvano Rusk [id number]) Specimen Source:  Source: Tawanna Cooler Order Number: [id number] Lab site:   Appended Document: Colonoscopy   Colonoscopy  Procedure date:  02/23/2010  Findings:          1) Two polyps removed, largest 6 mm ADENOMA AND HYPERPLASTIC     2) Moderate diverticulosis in the sigmoid colon     3) Internal and external hemorrhoids     4) Otherwise normal examination  Comments:      no recakll needed due to findings and age

## 2010-05-07 NOTE — Progress Notes (Signed)
Summary: speak to nurse  Medications Added OMEPRAZOLE 40 MG CPDR (OMEPRAZOLE) one tablet by mouth once daily       Phone Note Call from Patient Call back at Home Phone [phone]   Caller: Patient Call For: Dr Carlean Purl Summary of Call: Patient states that [employer] is no longer going to cover Lansoprazole but will cover Nexium and Omeprazole. Initial call taken by: Ronalee Red,  April 23, 2010 10:43 AM  Follow-up for Phone Call        I called patient and patient stated that he called his insurance company and they will not cover a RX for Lansoprazole but will cover RX for Nexium and Omeprazole. Pt requested that I send RX for Omeprazole because it will be cheaper. Sent RX  Follow-up by: Hope Pigeon CMA,  April 23, 2010 11:52 AM    New/Updated Medications: OMEPRAZOLE 40 MG CPDR (OMEPRAZOLE) one tablet by mouth once daily Prescriptions: OMEPRAZOLE 40 MG CPDR (OMEPRAZOLE) one tablet by mouth once daily  #30 x 12   Entered by:   Hope Pigeon CMA   Authorized by:   Gatha Mayer MD, [facility]   Signed by:   Hope Pigeon CMA on 04/23/2010   Method used:   [facility]       [address]       [place], [place]  [address]       Ph: [phone] or [phone]       Fax: [phone]   RxID:   [id number]

## 2010-06-03 ENCOUNTER — Encounter: Payer: Self-pay | Admitting: Family Medicine

## 2010-07-17 ENCOUNTER — Other Ambulatory Visit: Payer: Self-pay | Admitting: *Deleted

## 2010-07-17 DIAGNOSIS — E7800 Pure hypercholesterolemia, unspecified: Secondary | ICD-10-CM

## 2010-07-17 DIAGNOSIS — R946 Abnormal results of thyroid function studies: Secondary | ICD-10-CM

## 2010-07-21 ENCOUNTER — Other Ambulatory Visit (INDEPENDENT_AMBULATORY_CARE_PROVIDER_SITE_OTHER): Payer: Medicare Other | Admitting: Family Medicine

## 2010-07-21 DIAGNOSIS — E7800 Pure hypercholesterolemia, unspecified: Secondary | ICD-10-CM

## 2010-07-21 DIAGNOSIS — E785 Hyperlipidemia, unspecified: Secondary | ICD-10-CM

## 2010-07-21 DIAGNOSIS — R946 Abnormal results of thyroid function studies: Secondary | ICD-10-CM

## 2010-07-21 LAB — LDL CHOLESTEROL, DIRECT: Direct LDL: 107.3 mg/dL

## 2010-07-21 LAB — LIPID PANEL
HDL: 54.9 mg/dL (ref >39.00)
Total CHOL/HDL Ratio: 4
VLDL: 84.6 mg/dL — ABNORMAL HIGH (ref 0.0–40.0)

## 2010-07-21 LAB — TSH: TSH: 3.63 u[IU]/mL (ref 0.35–5.50)

## 2010-07-23 ENCOUNTER — Encounter: Payer: Self-pay | Admitting: Family Medicine

## 2010-07-23 ENCOUNTER — Ambulatory Visit (INDEPENDENT_AMBULATORY_CARE_PROVIDER_SITE_OTHER): Payer: Medicare Other | Admitting: Family Medicine

## 2010-07-23 DIAGNOSIS — R011 Cardiac murmur, unspecified: Secondary | ICD-10-CM

## 2010-07-23 DIAGNOSIS — I10 Essential (primary) hypertension: Secondary | ICD-10-CM

## 2010-07-23 DIAGNOSIS — E7800 Pure hypercholesterolemia, unspecified: Secondary | ICD-10-CM

## 2010-07-23 NOTE — Patient Instructions (Signed)
Take the fish oil daily and come back for a physical in 10/12.  Take care.  Keep exercising (try walking more) and keep working on your diet.  Call about setting up labs before the visit.

## 2010-07-23 NOTE — Progress Notes (Signed)
Elevated Cholesterol: Using medications without problems:yes Muscle aches: no Other complaints: tolerating 20mg  of crestor, max dose tolerated w/o myalgias.  This is likely max therapy for the patient labs reviewed with patient.  He's going to inc the fish oil to help with TG (was not taking it daily).  H/o murmur.  29 years past CABG.  No CP,SOB, BLE edema.  Diet and exercise d/w pt.  Rare red meat and healthy diet o/w.    HTN- Bp checked by patient and usually lower. Usually in 130s/70s.  See above.   PMH and SH reviewed  Meds, vitals, and allergies reviewed.   ROS: See HPI.  Otherwise negative.    GEN: nad, alert and oriented HEENT: mucous membranes moist NECK: supple w/o LA CV: rrr. Soft SEM noted.   PULM: ctab, no inc wob ABD: soft, +bs EXT: no edema SKIN: no acute rash

## 2010-07-23 NOTE — Assessment & Plan Note (Signed)
Pt to follow out of clinic and notify us if elevated.

## 2010-07-23 NOTE — Assessment & Plan Note (Signed)
Noted.  No alarming sx by history.  Can follow clinically.

## 2010-07-23 NOTE — Assessment & Plan Note (Signed)
Inc fish oil and d/w pt about exercise/diet.  No change in meds o/w.

## 2010-08-18 NOTE — Assessment & Plan Note (Signed)
[facility] OFFICE NOTE   NAME:Bergevin, CALDWELL TRIANO                          MRN:          [id number]  DATE:01/23/2007                            DOB:          10-20-1931    Mr. Steever is a very pleasant gentleman who has a history of coronary  disease status post coronary artery bypass graft in 1983, peripheral  vascular disease, cerebrovascular disease, hypertension and  hyperlipidemia.  Since I last saw him he has been doing well.  He denies  any dyspnea, chest pain, palpitations or syncope.  There is no pedal  edema.  He occasionally has mild pain in his legs, right greater than  left, with ambulation. Note - he did have carotid Doppler studies  performed in September.  He had a 40-59% right and a 60-79% left.  There  was an abnormal thyroid appearance and this is being evaluated by Dr.  Council Mechanic.   PRESENT MEDICATIONS:  1. Aspirin 81 mg daily.  2. Vytorin 10/80 daily.  3. Cozaar 50 mg daily.   PHYSICAL EXAMINATION:  VITAL SIGNS:  Blood pressure 122/74, pulse 63.  Weight is 185 pounds.  HEENT:  Normal with normal eyelids.  NECK:  Supple. There is a left carotid bruit.  CHEST:  Clear.  CARDIAC EXAM:  Shows a regular rate and rhythm, there is a 2/6 systolic  ejection murmur at the left sternal border.  ABDOMEN:  Exam shows no tenderness.  EXTREMITIES:  Show no edema.   His electrocardiogram today shows a sinus rhythm at a rate of 63.  There  is a first degree AV block. There are occasional PACs.  The axis is  normal.  There are nonspecific ST changes.   DISCHARGE DIAGNOSES:  1. Coronary artery disease status post coronary artery bypass graft -      Mr. Ent has had no chest pain or shortness of breath and his      Myoview performed in March of 2008 was low risk.  We will continue      with medical therapy including his aspirin, Statin and ARB.  2. Hypertension - his blood pressure is well controlled on his  present      medications.  3. Hyperlipidemia - he will continue on his Statin and we will have      his most recent lipids and liver functions forwarded to [place] for our      records.  4. History of benign prostatic hypertrophy.  5. History of arthritis.  6. Cerebrovascular disease - will need followup carotid Doppler's in      March or April.  7. Abnormal thyroid - this is being evaluated by Dr. Council Mechanic.  8. Peripheral vascular disease - He has mild claudication but this is      not symptom limiting.  We will continue with medical therapy.   I will see him back in 6 months.     Denice Bors Stanford Breed, MD, [facility]  Electronically Signed    BSC/MedQ  DD: 01/23/2007  DT: 01/23/2007  Job #: [id number]  cc:   Modesto Charon, MD

## 2010-08-18 NOTE — Assessment & Plan Note (Signed)
[facility] OFFICE NOTE   NAME:Ziebarth, AUTRY ZAREK                          MRN:          [id number]  DATE:07/28/2007                            DOB:          [birth year]    Mr. Drahos is a pleasant gentleman who has a history of coronary artery  disease status post coronary artery bypass grafting in 1983, peripheral  vascular disease, cerebrovascular disease, hypertension, hyperlipidemia.  His most recent Myoview was performed on June 07, 2006.  There was mild  ischemia in the base to mid inferior wall and his ejection fraction was  71%.  We have been treating this medically.  Since I last saw him, he  denies any dyspnea, chest pain, palpitations or syncope.  There is no  pedal edema.  He occasionally feels a mild burning in his lower  extremities bilaterally with more extreme activities but not with  routine activities.   MEDICATIONS:  1. Vytorin 10/80 p.o. daily.  2. Aspirin 81 mg p.o. daily.  3. Cozaar 50 mg p.o. daily.  4. Sertraline 0.50 mg p.o. daily.   PHYSICAL EXAMINATION:  VITAL SIGNS:  Blood pressure 155/81 and his pulse  is 70.  HEENT:  Normal.  NECK:  Supple.  There are bilateral soft carotid bruits.  CHEST:  Clear.  CARDIOVASCULAR:  Regular rhythm.  There is a 2/6 systolic murmur at the  left sternal border.  S2 is not diminished.  ABDOMEN:  No tenderness and there is no pulsatile masses noted.  EXTREMITIES:  No edema.   His electrocardiogram shows sinus rhythm at a rate of 70.  There is a  first-degree AV block.  There are nonspecific ST changes.   DIAGNOSES:  1. Coronary artery disease, status post coronary artery bypass      grafting - Mr. Aquilar is doing well with no chest pain or shortness      of breath.  We will continue with his aspirin, Statin.  We will      most likely repeat his Myoview when I see him back in 1 year.  He      will continue with risk factor modification including diet and     exercise.  2. Hypertension - his blood pressure is mildly elevated.  Note, he is      asked to discontinue his ARB in favor of an ACE inhibitor due to      the expense.  We will discontinue his Cozaar after he completes his      present prescription and begin lisinopril 20 mg p.o. daily after      that.  I have instructed and to contact me if he develops a cough.  3. Hyperlipidemia - he will continue on his Statin and Dr. Council Mechanic is      following his lipids and liver.  He is also following his renal      function.  Our goal LDL should be less than 70 given his history of      coronary disease.  4. History of benign prostatic hypertrophy.  5.  Murmur - we will schedule him to have an echocardiogram to more      fully evaluate, although it sounds to be aortic sclerosis verses      mild stenosis.  6. History of arthritis.  7. Cerebrovascular disease - he will need follow-up carotid Dopplers      in October of this year.  8. History of abnormal thyroid - this is being managed by Dr.      Council Mechanic.  9. Peripheral vascular disease - his symptoms do not appear to be      significantly limiting.  We can continue with medical therapy at      this point and consider invasive studies in the future if they      worsen.   I will see back in 12 months.     Denice Bors Stanford Breed, MD, [facility]  Electronically Signed    BSC/MedQ  DD: 07/28/2007  DT: 07/28/2007  Job #: [id number]   cc:   Modesto Charon, MD

## 2010-08-21 NOTE — Assessment & Plan Note (Signed)
[place]                            CARDIOLOGY OFFICE NOTE   NAME:Shaker, FURIOUS WRITER                          MRN:          [id number]  DATE:05/30/2006                            DOB:          05/05/1931    Mr. Goring is a very pleasant 77 year old gentleman with a past medical  history of coronary disease status post coronary artery bypassing graft  in 1983, hypertension, hyperlipidemia, benign prostatic hypertrophy who  returns for followup.  Since he was last seen, he states that he has  mild chest pain with more extreme exertion but not with routine  activities or with his daily walks.  There is no dyspnea, orthopnea, PND  or pedal edema.  There is no syncope.  Note he has had his mild  exertional angina for years.  It is unchanged.  His most recent nuclear  study was in July 2004.  At that time he had an ejection fraction of 66%  and there was mild ischemia in the inferior wall.   PAST MEDICAL HISTORY:  Is significant for hypertension and  hyperlipidemia but there is no diabetes mellitus.  He has a history of  coronary disease as outlined in the HPI.  He also has arthritis.  He has  benign prostatic hypertrophy.   PRESENT MEDICATIONS:  1. Cozaar 25 mg p.o. daily.  2. Vytorin 10/80 mg p.o. daily.  3. Aspirin one p.o. three times weekly.  4. Fexofenadine 180 mg tablets one-half p.o. p.r.n.  5. Sertraline.   PHYSICAL EXAMINATION TODAY:  VITAL SIGNS:  Show a blood pressure of  130/72 and his pulse is 63.  He weighs 186 pounds.  NECK:  Supple and there are no bruits noted.  His carotid upstroke is  normal.  CHEST:  Clear.  CARDIOVASCULAR:  Reveals a regular rate and rhythm.  There is a 2/6  systolic ejection murmur at the left sternal border.  S2 is preserved.  There is S3 or S4.  ABDOMINAL:  Shows no hepatosplenomegaly.  There is a mid abdominal bruit  and mild tenderness to deep palpation.  EXTREMITIES:  Show no edema.   His electrocardiogram  shows a normal sinus rhythm at a rate of 63.  There is a first degree AV block.  There are nonspecific ST changes.   DIAGNOSES:  1. Coronary artery disease status post coronary artery bypassing      graft.  2. Hypertension.  3. Hyperlipidemia.  4. Benign prostatic hypertrophy.  5. Arthritis.   PLAN:  Mr. Torstenson is doing well from a symptomatic standpoint.  However,  his grafts are 75 years old and his most recent nuclear study in 2004  showed mild inferior ischemia.  He also has exertional angina (but only  with extreme exertion).  I think we should risk-stratify with a stress  Myoview.  If it is low risk, we will continue with medical therapy.  I  also note he has a bruit and mild tenderness to deep palpation on  abdominal exam.  We will schedule him to have an ultrasound to exclude  aneurysm.  I have asked him to increase his aspirin to 81 mg p.o. daily.  His blood pressure is well controlled.  When he returns for his Myoview  we will schedule him to have lipids and adjust his regimen as indicated.  Our goal LDL will be less than 70, given history of coronary disease.  Note his recent liver functions were normal.  He will continue with diet  and exercise.  Note he does not smoke.  I will see him back in 1 year.     Denice Bors Stanford Breed, MD, [facility]  Electronically Signed    BSC/MedQ  DD: 05/30/2006  DT: 05/30/2006  Job #: [id number]   cc:   Modesto Charon, MD

## 2010-08-21 NOTE — Op Note (Signed)
[facility]  Patient:    [name], [name]                            MRN: [id number] Attending:  Shane Crutch. Gaynelle Arabian, M.D. CC:         Ramond Craver, M.D. - [facility]   Operative Report  PREOPERATIVE DIAGNOSIS:  Benign prostatic hypertrophy.  POSTOPERATIVE DIAGNOSIS:  Benign prostatic hypertrophy.  OPERATION: 1. Cystourethroscopy. 2. Transurethral needle ablation of prostate.  ANESTHESIA:  [place] (MAC).  PREPARATION:  After appropriate preanesthesia, the patient was brought to the operating room, placed on the operating table in a dorsal supine position.  He was then replaced in the dorsal lithotomy position, but the patient was too anxious to proceed with local anesthesia.  Xylocaine gel was placed in the urethra, but the patient then underwent MAC anesthetic, for relaxation.  Review of the patients history showed that this patient complains of frequency, urgency and nocturia, without help with medications.  The patient has a 90-pack-year history of smoking, a 47 cc prostate volume, about the length of the 3 cm urethra length and a transverse diameter of 60 mm.  He has a flow rate of 8 cc per second and a PSA of 1.2.  He has ______ symptom score sheet of 26/7 while on Cardura, with a quality of life assessment index of 4/6.  PROCEDURE:  The transurethral needle ablation scope was placed, and three lesions are created on each side of the prostate.  The patient had no difficulty once the anesthesia was introduced, and he tolerated the procedure well.  There was minimal bleeding.  The patient then was awakened after a #18 Foley catheter was placed to a straight drain, taken to the recovery room in good condition. DD:  11/12/99 TD:  11/12/99 Job: [id number]

## 2010-08-21 NOTE — Assessment & Plan Note (Signed)
[place]                            CARDIOLOGY OFFICE NOTE   NAME:[name], [name]                          MRN:          [id number]  DATE:06/28/2006                            DOB:          06/16/1931    HISTORY OF PRESENT ILLNESS:  [name] returns for follow up today.  He  has a history of coronary disease status post coronary bypassing graft  in 1983, hypertension, and hyperlipidemia.  When I saw him previously,  we scheduled him to have a Myoview for risk stratification.  This was  performed on June 07, 2006.  He was found to have an ejection fraction  of 71%.  There was mild inferior ischemia.  This was predominantly at  the base and mid ventricle.  He also had abdominal ultrasound that  showed no aneurysm.  There was a moderate stenosis in the left iliac.  Since that time, he has done well with no change in his symptoms.  He  does not have significant dyspnea on exertion, orthopnea, PND or  syncope.  He will have occasional chest tightness when he exerts himself  to a more extreme degree.  He has had this for some time and it is  stable.  He does not have chest pain with routine activities or at rest.   MEDICATIONS:  1. Cozaar 25 mg p.o. daily.  2. Vytorin 10/80 daily.  3. Fexofenadine.  4. Aspirin 81 mg p.o. daily.  5. Fish oil.  6. Citrulline.   PHYSICAL EXAMINATION:  VITAL SIGNS:  Blood pressure that is mildly  elevated to 152/78, pulse 68.  CHEST:  Clear.  CARDIOVASCULAR:  Regular rhythm.  EXTREMITIES:  No edema.   LABORATORY DATA:  Recent laboratories showed normal liver functions,  total cholesterol of 181, triglycerides 323, LDL 73.8.   DIAGNOSIS:  1. Coronary artery disease status post coronary artery bypass graft.  2. Hypertension.  3. Hyperlipidemia.  4. History of benign prostatic hypertrophy.  5. History of arthritis.  6. Cerebrovascular disease.   PLAN:  [name] is doing well from a symptomatic standpoint.  His  Myoview showed mild inferior ischemia.  However, his symptoms are  unchanged, and it appears to be low risk scan with normal LV function.  We will therefore continue with medical therapy.  His blood pressure is  finally elevated today, and I will increase his Cozaar to 50 mg p.o.  daily.  I have not added a beta blocker due to his first degree AV  block.  He will continue with diet and exercise.  Note, his  triglycerides are mildly elevated.  He will continue on his Vytorin and  fish oil.  I have also instructed him to follow a low cholesterol diet.  This will need to be followed in the future, and additional medications  can be added as needed.  He will have follow up carotid Dopplers as he  did have 40-59% bilateral carotid stenoses in September 2005.  He will  see me back in four months, and I will adjust his blood pressure  medications as  indicated.  He is to track his blood pressure at home and  keep records for [place].     Denice Bors Stanford Breed, MD, [facility]  Electronically Signed    BSC/MedQ  DD: 06/28/2006  DT: 06/28/2006  Job #: [id number]   cc:   Modesto Charon, MD

## 2010-10-05 ENCOUNTER — Other Ambulatory Visit: Payer: Self-pay | Admitting: Cardiology

## 2010-11-01 ENCOUNTER — Other Ambulatory Visit: Payer: Self-pay | Admitting: Cardiology

## 2010-11-04 ENCOUNTER — Other Ambulatory Visit: Payer: Self-pay | Admitting: *Deleted

## 2010-11-04 MED ORDER — LISINOPRIL 20 MG PO TABS: 20.0000 mg | ORAL_TABLET | Freq: Every day | ORAL | Status: DC

## 2011-01-05 ENCOUNTER — Ambulatory Visit (INDEPENDENT_AMBULATORY_CARE_PROVIDER_SITE_OTHER): Payer: Medicare Other

## 2011-01-05 DIAGNOSIS — Z23 Encounter for immunization: Secondary | ICD-10-CM

## 2011-01-20 ENCOUNTER — Other Ambulatory Visit: Payer: Self-pay | Admitting: Family Medicine

## 2011-01-20 DIAGNOSIS — E7800 Pure hypercholesterolemia, unspecified: Secondary | ICD-10-CM

## 2011-01-20 DIAGNOSIS — N400 Enlarged prostate without lower urinary tract symptoms: Secondary | ICD-10-CM

## 2011-01-25 ENCOUNTER — Other Ambulatory Visit: Payer: Self-pay | Admitting: Cardiology

## 2011-01-25 ENCOUNTER — Other Ambulatory Visit (INDEPENDENT_AMBULATORY_CARE_PROVIDER_SITE_OTHER): Payer: Medicare Other

## 2011-01-25 DIAGNOSIS — E7800 Pure hypercholesterolemia, unspecified: Secondary | ICD-10-CM

## 2011-01-25 DIAGNOSIS — R946 Abnormal results of thyroid function studies: Secondary | ICD-10-CM

## 2011-01-25 DIAGNOSIS — N400 Enlarged prostate without lower urinary tract symptoms: Secondary | ICD-10-CM

## 2011-01-25 LAB — LIPID PANEL
Cholesterol: 232 mg/dL — ABNORMAL HIGH (ref 0–200)
Total CHOL/HDL Ratio: 3
VLDL: 73.2 mg/dL — ABNORMAL HIGH (ref 0.0–40.0)

## 2011-01-25 LAB — LDL CHOLESTEROL, DIRECT: Direct LDL: 104.8 mg/dL

## 2011-01-25 LAB — COMPREHENSIVE METABOLIC PANEL
Alkaline Phosphatase: 57 U/L (ref 39–117)
Glucose, Bld: 104 mg/dL — ABNORMAL HIGH (ref 70–99)
Sodium: 137 mEq/L (ref 135–145)
Total Bilirubin: 0.6 mg/dL (ref 0.3–1.2)
Total Protein: 7.8 g/dL (ref 6.0–8.3)

## 2011-01-25 LAB — TSH: TSH: 3.59 u[IU]/mL (ref 0.35–5.50)

## 2011-01-26 ENCOUNTER — Other Ambulatory Visit: Payer: Self-pay | Admitting: Family Medicine

## 2011-01-26 LAB — PSA, MEDICARE: PSA: 2.72 ng/mL (ref <=4.00)

## 2011-01-26 NOTE — Telephone Encounter (Signed)
Duplicate, sent to MD earlier.

## 2011-01-26 NOTE — Telephone Encounter (Signed)
Received faxed refill request from pharmacy. Is it okay to refill medication? 

## 2011-01-29 ENCOUNTER — Ambulatory Visit (INDEPENDENT_AMBULATORY_CARE_PROVIDER_SITE_OTHER): Payer: Medicare Other | Admitting: Family Medicine

## 2011-01-29 ENCOUNTER — Encounter: Payer: Self-pay | Admitting: Family Medicine

## 2011-01-29 DIAGNOSIS — N400 Enlarged prostate without lower urinary tract symptoms: Secondary | ICD-10-CM

## 2011-01-29 DIAGNOSIS — I10 Essential (primary) hypertension: Secondary | ICD-10-CM

## 2011-01-29 DIAGNOSIS — N259 Disorder resulting from impaired renal tubular function, unspecified: Secondary | ICD-10-CM

## 2011-01-29 DIAGNOSIS — E7800 Pure hypercholesterolemia, unspecified: Secondary | ICD-10-CM

## 2011-01-29 DIAGNOSIS — I2510 Atherosclerotic heart disease of native coronary artery without angina pectoris: Secondary | ICD-10-CM

## 2011-01-29 MED ORDER — LISINOPRIL 20 MG PO TABS: 20.0000 mg | ORAL_TABLET | Freq: Every day | ORAL | Status: DC

## 2011-01-29 NOTE — Progress Notes (Signed)
BPH: PSA still <4 and sx controlled after TUNA.  Stream okay, no dysuria.  See plan.    Hypertension:    Using medication without problems or lightheadedness: yes Chest pain with exertion: he has some minimal chest tightness with exertion that he can walk through.  This is at baseline.  It has been like this for years.   Edema:no Short of breath:no  CAD: S/p cabg, compliant with meds.  Not sob; chest tightness as above.    Elevated Cholesterol: Using medications without problems: yes Muscle aches: not on current dose Diet compliance: yes, "I know what I need to eat." Exercise: yes, walking Other complaints:  He's talked with wife about advance directive.  Full code.   Meds, vitals, and allergies reviewed.   PMH and SH reviewed  ROS: See HPI.  Otherwise negative.    GEN: nad, alert and oriented HEENT: mucous membranes moist NECK: supple w/o LA CV: rrr. Murmur noted PULM: ctab, no inc wob ABD: soft, +bs EXT: no edema SKIN: no acute rash Prostate gland firm and smooth, mild enlargement, but no nodularity, tenderness, mass, asymmetry or induration. Ext hemorrhoids noted

## 2011-01-29 NOTE — Patient Instructions (Addendum)
Please call the cardiology clinic about when they want to see you again.   Take care and let me know if you have other concerns.

## 2011-01-30 ENCOUNTER — Encounter: Payer: Self-pay | Admitting: Family Medicine

## 2011-01-30 NOTE — Assessment & Plan Note (Signed)
Slightly elevation in PSA relative to prev, but sill <4.  Can recheck next year.  No sig abnormality on exam today.  D/w pt and he agrees with plan.

## 2011-01-30 NOTE — Assessment & Plan Note (Signed)
D/w pt, no sig change in Cr.

## 2011-01-30 NOTE — Assessment & Plan Note (Signed)
Continue current meds.  Labs d/w pt along with diet and exercise.

## 2011-01-30 NOTE — Assessment & Plan Note (Signed)
He can tolerate this dose of crestor.  No inc in dose, in spite of elevated lipids.  Continue diet/exercise.

## 2011-01-30 NOTE — Assessment & Plan Note (Signed)
Continue current meds, he'll call cards about follow up.

## 2011-03-16 ENCOUNTER — Encounter: Payer: Self-pay | Admitting: Family Medicine

## 2011-05-11 ENCOUNTER — Other Ambulatory Visit: Payer: Self-pay | Admitting: Internal Medicine

## 2011-05-24 ENCOUNTER — Other Ambulatory Visit: Payer: Self-pay | Admitting: Cardiology

## 2011-06-02 ENCOUNTER — Other Ambulatory Visit: Payer: Self-pay | Admitting: Family Medicine

## 2011-06-27 ENCOUNTER — Other Ambulatory Visit: Payer: Self-pay | Admitting: Family Medicine

## 2011-06-28 NOTE — Telephone Encounter (Signed)
Sent!

## 2011-06-28 NOTE — Telephone Encounter (Signed)
Electronic refill request

## 2011-09-21 ENCOUNTER — Other Ambulatory Visit: Payer: Self-pay | Admitting: *Deleted

## 2011-09-21 MED ORDER — LISINOPRIL 20 MG PO TABS: 20.0000 mg | ORAL_TABLET | Freq: Every day | ORAL | Status: DC

## 2011-09-21 MED ORDER — AMLODIPINE BESYLATE 5 MG PO TABS: 5.0000 mg | ORAL_TABLET | Freq: Every day | ORAL | Status: DC

## 2011-12-07 ENCOUNTER — Encounter: Payer: Self-pay | Admitting: Family Medicine

## 2011-12-07 ENCOUNTER — Ambulatory Visit (INDEPENDENT_AMBULATORY_CARE_PROVIDER_SITE_OTHER): Payer: Medicare Other | Admitting: Family Medicine

## 2011-12-07 VITALS — BP 130/78 | HR 62 | Temp 99.0°F | Ht 72.0 in | Wt 179.2 lb

## 2011-12-07 DIAGNOSIS — B9789 Other viral agents as the cause of diseases classified elsewhere: Secondary | ICD-10-CM

## 2011-12-07 DIAGNOSIS — B349 Viral infection, unspecified: Secondary | ICD-10-CM

## 2011-12-07 NOTE — Patient Instructions (Addendum)
Drink plenty of fluids and take plain mucinex in the morning.  Gargle with warm salt water for your throat.  This should gradually improve.  Take care.  If your cough gets worse or if you have other concerns, call me.

## 2011-12-07 NOTE — Progress Notes (Signed)
Cough started about 1 week ago.  No known antecedent sick contacts.  Started with ST and then had more congestion.  Sleeping well.  Cough is productive in the AM, more than at night.  No wheeze.  No fevers, tmax ~99.  No myalgias but some HA recently.  Sputum is yellow. Voice change noted.  No true dysphagia but ST likely worse from the cough.  Resting and sipping fluids.  Taking some ibuprofen for the throat pain.    Meds, vitals, and allergies reviewed.   ROS: See HPI.  Otherwise, noncontributory.  GEN: nad, alert and oriented HEENT: mucous membranes moist, tm w/o erythema, nasal exam w/o erythema, clear discharge noted,  OP with cobblestoning NECK: supple w/o LA CV: rrr.   PULM: ctab, no inc wob EXT: no edema SKIN: no acute rash

## 2011-12-08 DIAGNOSIS — B349 Viral infection, unspecified: Secondary | ICD-10-CM | POA: Insufficient documentation

## 2011-12-08 NOTE — Assessment & Plan Note (Signed)
Lungs completely CTAB and no inc in WOB.  Likely benign viral infection, should resolve.  Supportive tx in meantime.  Call back parameters discussed.  F/u prn.

## 2012-01-11 ENCOUNTER — Other Ambulatory Visit: Payer: Self-pay | Admitting: Internal Medicine

## 2012-02-16 ENCOUNTER — Other Ambulatory Visit: Payer: Self-pay | Admitting: Family Medicine

## 2012-03-08 ENCOUNTER — Other Ambulatory Visit: Payer: Self-pay | Admitting: Internal Medicine

## 2012-04-13 ENCOUNTER — Other Ambulatory Visit: Payer: Self-pay | Admitting: Cardiology

## 2012-04-13 MED ORDER — AMLODIPINE BESYLATE 5 MG PO TABS: 5.0000 mg | ORAL_TABLET | Freq: Every day | ORAL | Status: DC

## 2012-05-06 ENCOUNTER — Other Ambulatory Visit: Payer: Self-pay | Admitting: Internal Medicine

## 2012-05-10 ENCOUNTER — Other Ambulatory Visit: Payer: Self-pay | Admitting: Internal Medicine

## 2012-05-10 ENCOUNTER — Telehealth: Payer: Self-pay | Admitting: Internal Medicine

## 2012-05-10 NOTE — Telephone Encounter (Signed)
Spoke to patient and made appointment for 05/23/12 at 9:45am.  Also sent in omeprazole rx as requested.

## 2012-05-17 ENCOUNTER — Encounter: Payer: Self-pay | Admitting: *Deleted

## 2012-05-23 ENCOUNTER — Encounter: Payer: Self-pay | Admitting: Internal Medicine

## 2012-05-23 ENCOUNTER — Ambulatory Visit (INDEPENDENT_AMBULATORY_CARE_PROVIDER_SITE_OTHER): Payer: Medicare Other | Admitting: Internal Medicine

## 2012-05-23 VITALS — BP 146/80 | HR 68 | Ht 72.0 in | Wt 184.8 lb

## 2012-05-23 DIAGNOSIS — K219 Gastro-esophageal reflux disease without esophagitis: Secondary | ICD-10-CM | POA: Insufficient documentation

## 2012-05-23 DIAGNOSIS — K222 Esophageal obstruction: Secondary | ICD-10-CM

## 2012-05-23 DIAGNOSIS — K4090 Unilateral inguinal hernia, without obstruction or gangrene, not specified as recurrent: Secondary | ICD-10-CM | POA: Insufficient documentation

## 2012-05-23 MED ORDER — OMEPRAZOLE 40 MG PO CPDR: 40.0000 mg | DELAYED_RELEASE_CAPSULE | Freq: Every day | ORAL | Status: DC
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
## 2012-05-23 NOTE — Patient Instructions (Addendum)
Hernia A hernia occurs when an internal organ pushes out through a weak spot in the abdominal wall. Hernias most commonly occur in the groin and around the navel. Hernias often can be pushed back into place (reduced). Most hernias tend to get worse over time. Some abdominal hernias can get stuck in the opening (irreducible or incarcerated hernia) and cannot be reduced. An irreducible abdominal hernia which is tightly squeezed into the opening is at risk for impaired blood supply (strangulated hernia). A strangulated hernia is a medical emergency. Because of the risk for an irreducible or strangulated hernia, surgery may be recommended to repair a hernia. CAUSES   Heavy lifting.  Prolonged coughing.  Straining to have a bowel movement.  A cut (incision) made during an abdominal surgery. HOME CARE INSTRUCTIONS   Bed rest is not required. You may continue your normal activities.  Avoid lifting more than 10 pounds (4.5 kg) or straining.  Cough gently. If you are a smoker it is best to stop. Even the best hernia repair can break down with the continual strain of coughing. Even if you do not have your hernia repaired, a cough will continue to aggravate the problem.  Do not wear anything tight over your hernia. Do not try to keep it in with an outside bandage or truss. These can damage abdominal contents if they are trapped within the hernia sac.  Eat a normal diet.  Avoid constipation. Straining over long periods of time will increase hernia size and encourage breakdown of repairs. If you cannot do this with diet alone, stool softeners may be used. SEEK IMMEDIATE MEDICAL CARE IF:   You have a fever.  You develop increasing abdominal pain.  You feel nauseous or vomit.  Your hernia is stuck outside the abdomen, looks discolored, feels hard, or is tender.  You have any changes in your bowel habits or in the hernia that are unusual for you.  You have increased pain or swelling around the  hernia.  You cannot push the hernia back in place by applying gentle pressure while lying down. MAKE SURE YOU:   Understand these instructions.  Will watch your condition.  Will get help right away if you are not doing well or get worse. Document Released: 03/22/2005 Document Revised: 06/14/2011 Document Reviewed: 11/09/2007 [facility] Patient Information 2013 [place].  You have been scheduled for an appointment with Dr Ninfa Linden at [facility]. Your appointment is on 05/29/12 at 9:20 am. Please arrive at 8:50 am for registration. Make certain to bring a list of current medications, including any over the counter medications or vitamins. Also bring your co-pay if you have one as well as your insurance cards. [facility] is located at [address]. Should you need to reschedule your appointment, please contact them at [phone].  We have refilled your omeprazole today.  Thank you for choosing me and [facility].  Gatha Mayer, M.D., [facility]

## 2012-05-29 ENCOUNTER — Encounter (INDEPENDENT_AMBULATORY_CARE_PROVIDER_SITE_OTHER): Payer: Self-pay | Admitting: Surgery

## 2012-05-29 ENCOUNTER — Ambulatory Visit (INDEPENDENT_AMBULATORY_CARE_PROVIDER_SITE_OTHER): Payer: Medicare Other | Admitting: Surgery

## 2012-05-29 VITALS — BP 142/78 | HR 75 | Temp 98.0°F | Resp 14 | Ht 72.0 in | Wt 180.4 lb

## 2012-05-29 DIAGNOSIS — K4020 Bilateral inguinal hernia, without obstruction or gangrene, not specified as recurrent: Secondary | ICD-10-CM | POA: Insufficient documentation
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
## 2012-06-12 ENCOUNTER — Other Ambulatory Visit: Payer: Self-pay | Admitting: Cardiology

## 2012-07-10 ENCOUNTER — Encounter (INDEPENDENT_AMBULATORY_CARE_PROVIDER_SITE_OTHER): Payer: Self-pay | Admitting: Surgery

## 2012-07-10 ENCOUNTER — Ambulatory Visit (INDEPENDENT_AMBULATORY_CARE_PROVIDER_SITE_OTHER): Payer: Medicare Other | Admitting: Surgery

## 2012-07-10 VITALS — BP 138/86 | HR 66 | Temp 97.8°F | Resp 12 | Ht 72.0 in | Wt 182.8 lb

## 2012-07-10 DIAGNOSIS — K4020 Bilateral inguinal hernia, without obstruction or gangrene, not specified as recurrent: Secondary | ICD-10-CM

## 2012-07-10 NOTE — Progress Notes (Signed)
Subjective:     Patient ID: Kenneth Solian., male   DOB: 1932-02-07, 79 y.o.   MRN: [id number]  HPI He is here for reevaluation of his bilateral inguinal hernias. He has been very active in no discomfort or pain  Review of Systems     Objective:   Physical Exam    On exam, there are very tiny bilateral inguinal hernias which are nontender and easily reducible Assessment:     Bilateral inguinal hernias     Plan:     As he is virtually asymptomatic, we will hold on elective repair. Should he started noticing a bulge or have increasing discomfort he will come back and we will readdress repair with mesh

## 2012-08-05 ENCOUNTER — Other Ambulatory Visit: Payer: Self-pay | Admitting: Cardiology

## 2012-09-30 ENCOUNTER — Other Ambulatory Visit: Payer: Self-pay | Admitting: Cardiology

## 2012-10-23 ENCOUNTER — Ambulatory Visit (INDEPENDENT_AMBULATORY_CARE_PROVIDER_SITE_OTHER): Payer: Medicare Other | Admitting: Family Medicine

## 2012-10-23 ENCOUNTER — Encounter: Payer: Self-pay | Admitting: Family Medicine

## 2012-10-23 VITALS — BP 138/70 | HR 75 | Temp 98.0°F | Wt 179.2 lb

## 2012-10-23 DIAGNOSIS — R109 Unspecified abdominal pain: Secondary | ICD-10-CM | POA: Insufficient documentation

## 2012-10-23 LAB — POCT URINALYSIS DIPSTICK
Bilirubin, UA: NEGATIVE
Glucose, UA: NEGATIVE
Spec Grav, UA: 1.01
Urobilinogen, UA: NEGATIVE

## 2012-10-23 MED ORDER — CIPROFLOXACIN HCL 500 MG PO TABS: 500.0000 mg | ORAL_TABLET | Freq: Two times a day (BID) | ORAL | Status: DC

## 2012-10-23 NOTE — Progress Notes (Signed)
Abd pain.  Started about 1 week ago.  H/o similar about 3 weeks ago, resolved after a few days.  Lower anterior abd pain, more flatulence.  Pain, uncomfortable with initiation of urination.  Intermittent pain, can wake him at night.  Occ loose stools, no constipation.  No blood in stool.  Known diverticulosis.  No fevers.  No FCV.  Occ nausea.  No back pain.    Meds, vitals, and allergies reviewed.   ROS: See HPI.  Otherwise, noncontributory.  nad ncat Mmm rrr ctab abd soft and not ttp except for suprapubic area ttp No rebound.   Normal BS Ext w/o edema

## 2012-10-23 NOTE — Assessment & Plan Note (Signed)
With hematuria and concern for UTI.  Start abx today, check ucx and he'll notify us with update.  He agrees.  Nontoxic.  Doesn't appear to have GI primary source.

## 2012-10-23 NOTE — Patient Instructions (Addendum)
Drink plenty of water and start the antibiotics today.  We'll contact you with your lab report.  Take care.   I would like an update in a few days.

## 2012-10-24 LAB — URINE CULTURE: Organism ID, Bacteria: NO GROWTH

## 2012-10-27 ENCOUNTER — Telehealth: Payer: Self-pay

## 2012-10-27 DIAGNOSIS — R300 Dysuria: Secondary | ICD-10-CM

## 2012-10-27 NOTE — Telephone Encounter (Signed)
Pt said is still uncomfortable, better than when seen but pt request urology referral to Dr Gaynelle Arabian to investigate further. Pt has not seen any more blood in urine.Please advise.

## 2012-10-27 NOTE — Telephone Encounter (Signed)
Referral is in. Thanks.

## 2012-11-25 ENCOUNTER — Other Ambulatory Visit: Payer: Self-pay | Admitting: Cardiology

## 2012-12-24 ENCOUNTER — Other Ambulatory Visit: Payer: Self-pay | Admitting: Family Medicine

## 2012-12-24 DIAGNOSIS — E7800 Pure hypercholesterolemia, unspecified: Secondary | ICD-10-CM

## 2012-12-26 ENCOUNTER — Other Ambulatory Visit: Payer: Self-pay | Admitting: Family Medicine

## 2012-12-28 ENCOUNTER — Other Ambulatory Visit (INDEPENDENT_AMBULATORY_CARE_PROVIDER_SITE_OTHER): Payer: Medicare Other

## 2012-12-28 DIAGNOSIS — I10 Essential (primary) hypertension: Secondary | ICD-10-CM

## 2012-12-28 DIAGNOSIS — E7800 Pure hypercholesterolemia, unspecified: Secondary | ICD-10-CM

## 2012-12-28 LAB — COMPREHENSIVE METABOLIC PANEL WITH GFR
ALT: 8 U/L (ref 0–53)
AST: 21 U/L (ref 0–37)
Albumin: 4.2 g/dL (ref 3.5–5.2)
Alkaline Phosphatase: 44 U/L (ref 39–117)
BUN: 24 mg/dL — ABNORMAL HIGH (ref 6–23)
CO2: 28 meq/L (ref 19–32)
Calcium: 9.2 mg/dL (ref 8.4–10.5)
Chloride: 103 meq/L (ref 96–112)
Creatinine, Ser: 1.8 mg/dL — ABNORMAL HIGH (ref 0.4–1.5)
GFR: 38.82 mL/min — ABNORMAL LOW (ref >60.00)
Glucose, Bld: 113 mg/dL — ABNORMAL HIGH (ref 70–99)
Potassium: 4.4 meq/L (ref 3.5–5.1)
Sodium: 137 meq/L (ref 135–145)
Total Bilirubin: 0.8 mg/dL (ref 0.3–1.2)
Total Protein: 7.1 g/dL (ref 6.0–8.3)

## 2013-01-05 ENCOUNTER — Ambulatory Visit (INDEPENDENT_AMBULATORY_CARE_PROVIDER_SITE_OTHER): Payer: Medicare Other | Admitting: Family Medicine

## 2013-01-05 ENCOUNTER — Encounter: Payer: Self-pay | Admitting: Family Medicine

## 2013-01-05 VITALS — BP 108/60 | HR 66 | Temp 98.3°F | Ht 72.5 in | Wt 181.2 lb

## 2013-01-05 DIAGNOSIS — E7800 Pure hypercholesterolemia, unspecified: Secondary | ICD-10-CM

## 2013-01-05 DIAGNOSIS — N400 Enlarged prostate without lower urinary tract symptoms: Secondary | ICD-10-CM

## 2013-01-05 DIAGNOSIS — I10 Essential (primary) hypertension: Secondary | ICD-10-CM

## 2013-01-05 DIAGNOSIS — I6529 Occlusion and stenosis of unspecified carotid artery: Secondary | ICD-10-CM

## 2013-01-05 DIAGNOSIS — N259 Disorder resulting from impaired renal tubular function, unspecified: Secondary | ICD-10-CM

## 2013-01-05 DIAGNOSIS — Z0000 Encounter for general adult medical examination without abnormal findings: Secondary | ICD-10-CM

## 2013-01-05 DIAGNOSIS — I2510 Atherosclerotic heart disease of native coronary artery without angina pectoris: Secondary | ICD-10-CM

## 2013-01-05 NOTE — Patient Instructions (Addendum)
Rosaria Ferries will call about your referral. Stop the amlodipine and see if you feel better.  If you BP isn't controlled, let me know.  Take care.  Glad to see you.

## 2013-01-06 NOTE — Assessment & Plan Note (Signed)
Will set up with cards again.  No CP.  The episode above doesn't seem to be cardiac related.

## 2013-01-06 NOTE — Assessment & Plan Note (Signed)
Continue meds as is.  Continue diet/exercise.

## 2013-01-06 NOTE — Assessment & Plan Note (Signed)
Per uro.

## 2013-01-06 NOTE — Assessment & Plan Note (Signed)
See scanned forms.  Routine anticipatory guidance given to patient.  See health maintenance. Flu 2014 Shingles 2008 PNA 2011 Tetanus 2011 Colonoscopy 2012 Prostate cancer screening per uro Advance directive d/w pt.  Wife designated if incapacitated.  Cognitive function addressed- see scanned forms- and if abnormal then additional documentation follows.

## 2013-01-06 NOTE — Progress Notes (Signed)
I have personally reviewed the Medicare Annual Wellness questionnaire and have noted 1. The patient's medical and social history 2. Their use of alcohol, tobacco or illicit drugs 3. Their current medications and supplements 4. The patient's functional ability including ADL's, fall risks, home safety risks and hearing or visual             impairment. 5. Diet and physical activities 6. Evidence for depression or mood disorders  The patients weight, height, BMI have been recorded in the chart and visual acuity is per eye clinic.  I have made referrals, counseling and provided education to the patient based review of the above and I have provided the pt with a written personalized care plan for preventive services.  See scanned forms.  Routine anticipatory guidance given to patient.  See health maintenance. Flu 2014 Shingles 2008 PNA 2011 Tetanus 2011 Colonoscopy 2012 Prostate cancer screening per uro Advance directive d/w pt.  Wife designated if incapacitated.  Cognitive function addressed- see scanned forms- and if abnormal then additional documentation follows.   H/o CAD and due for fu with cards.  No CP, SOB, BLE edema.  Compliant with meds.  Due for f/u carotid study.   LUTS per uro. Started on flomax per uro- 1 episode where he got mildly dehydrated, hot, sweaty and lightheaded. No sx/episodes like this o/w.  He had d/w uro about this.    Hypertension:    Using medication without problems or lightheadedness: does get lightheaded as above Chest pain with exertion:no Edema:no Short of breath:no  Elevated Cholesterol: Using medications without problems:yes Muscle aches: tolerable Diet compliance:yes Exercise:yes  PMH and SH reviewed  Meds, vitals, and allergies reviewed.   ROS: See HPI.  Otherwise negative.    GEN: nad, alert and oriented HEENT: mucous membranes moist NECK: supple w/o LA CV: rrr. PULM: ctab, no inc wob ABD: soft, +bs EXT: no edema SKIN: no acute  rash

## 2013-01-06 NOTE — Assessment & Plan Note (Signed)
Stable Cr.  D/w pt.

## 2013-01-06 NOTE — Assessment & Plan Note (Signed)
Stop amlodipine and report back with sx and BP readings.  He agrees.

## 2013-01-06 NOTE — Assessment & Plan Note (Signed)
Repeat u/s pending.

## 2013-01-12 ENCOUNTER — Ambulatory Visit (INDEPENDENT_AMBULATORY_CARE_PROVIDER_SITE_OTHER): Payer: Medicare Other | Admitting: Cardiovascular Disease

## 2013-01-12 ENCOUNTER — Encounter: Payer: Self-pay | Admitting: Cardiovascular Disease

## 2013-01-12 VITALS — BP 120/60 | HR 63 | Ht 72.0 in | Wt 180.2 lb

## 2013-01-12 DIAGNOSIS — I2510 Atherosclerotic heart disease of native coronary artery without angina pectoris: Secondary | ICD-10-CM

## 2013-01-12 DIAGNOSIS — I739 Peripheral vascular disease, unspecified: Secondary | ICD-10-CM

## 2013-01-12 DIAGNOSIS — E7800 Pure hypercholesterolemia, unspecified: Secondary | ICD-10-CM

## 2013-01-12 DIAGNOSIS — I6529 Occlusion and stenosis of unspecified carotid artery: Secondary | ICD-10-CM

## 2013-01-12 DIAGNOSIS — I10 Essential (primary) hypertension: Secondary | ICD-10-CM

## 2013-01-12 DIAGNOSIS — R011 Cardiac murmur, unspecified: Secondary | ICD-10-CM

## 2013-01-12 NOTE — Assessment & Plan Note (Addendum)
We did discuss that prior leg ultrasounds suggested greater than 50% left femoral arterial disease. He had leg fatigue. Recommended lower extremity Doppler at some point. He prefers to wait and check the carotids first.

## 2013-01-12 NOTE — Assessment & Plan Note (Signed)
Blood pressure is well controlled on today's visit. No changes made to the medications. 

## 2013-01-12 NOTE — Assessment & Plan Note (Signed)
He we'll restart low-dose Crestor with slow titration upwards as tolerated. We have shown him his numbers over the past several years and discussed with him that he is high risk of progression of his PAD and CAD.  Additional options would include adding Zetia. Samples were provided today

## 2013-01-12 NOTE — Patient Instructions (Signed)
You are doing well.  Please restart crestor daily After a few weeks  If tolerated start zetia one a day  Please call the office for any chest pain or shortness of breath  Please call us if you have new issues that need to be addressed before your next appt.  Your physician wants you to follow-up in: 6 months.  You will receive a reminder letter in the mail two months in advance. If you don't receive a letter, please call our office to schedule the follow-up appointment.

## 2013-01-12 NOTE — Addendum Note (Signed)
Addended by: Minna Merritts on: 01/12/2013 12:33 PM   Modules accepted: Level of Service

## 2013-01-12 NOTE — Assessment & Plan Note (Signed)
Murmur appreciated on exam. This can be followed closely and if there is clinical signs of shortness of breath, edema, increasing intensity of the murmur, echocardiogram could be ordered

## 2013-01-12 NOTE — Progress Notes (Signed)
Patient ID: [name]., male    DOB: December 11, 1931, 79 y.o.   MRN: [id number]  HPI Comments: [name] is a pleasant gentleman who has a history of coronary artery disease status post coronary artery bypass grafting in 1983, peripheral vascular disease, cerebrovascular disease, hypertension, hyperlipidemia.  Last Myoview in June of 2010 revealed  mild inferior ischemia.  treated  medically.  Also note the patient had ABIs performed several years ago,  There was a greater than 50% mid left SFA stenosis and  the left ABI was in the moderate range.   Echocardiogram performed on Aug 09, 2007 showed normal LV function and trivial mitral and tricuspid regurgitation.  Abdominal ultrasound in March of 2008 showed no aneurysm. There is moderate flow reduction in the left common iliac.  Last carotid Dopplers in several years ago showed a 40-59% right and 60-79% left stenosis.  In followup today, he reports that he is doing well. He is active, denies any chest pain or shortness of breath with exertion. Legs are tired when he walks but does not report having notable muscle pain or claudication-type symptoms. He stopped taking his cholesterol medication several years ago and since then cholesterol has trended up to 300. He does not do any regular exercise but does stay active.  EKG shows normal sinus rhythm with rate 63 beats per minute, APCs noted with compensatory pause.   Outpatient Encounter Prescriptions as of 01/12/2013  Medication Sig Dispense Refill  . aspirin EC 81 MG EC tablet Take 81 mg by mouth daily.        . Cholecalciferol (VITAMIN D3) 1000 UNITS tablet Take 1,000 Units by mouth daily.        . fexofenadine (ALLEGRA) 180 MG tablet daily as needed. Take one to one half tablet daily      . fluticasone (FLONASE) 50 MCG/ACT nasal spray 2 sprays by Nasal route daily as needed.       [name] ibuprofen (ADVIL) 200 MG tablet as needed.        [name] lisinopril (PRINIVIL,ZESTRIL) 20 MG tablet Take 1 tablet (20  mg total) by mouth daily.  30 tablet  12  . meclizine (ANTIVERT) 25 MG tablet Take 25 mg by mouth every 6 (six) hours as needed.       . Omega-3 Fatty Acids (FISH OIL) 1000 MG CAPS Twice weekly.      [name] omeprazole (PRILOSEC) 40 MG capsule Take 1 capsule (40 mg total) by mouth daily.  90 capsule  3  . rosuvastatin (CRESTOR) 40 MG tablet  Not taking recently       . tamsulosin (FLOMAX) 0.4 MG CAPS capsule Take 0.4 mg by mouth daily after supper.         Review of Systems  Constitutional: Negative.   HENT: Negative.   Eyes: Negative.   Respiratory: Negative.   Cardiovascular: Negative.   Gastrointestinal: Negative.   Endocrine: Negative.   Musculoskeletal: Negative.   Skin: Negative.   Allergic/Immunologic: Negative.   Neurological: Negative.   Hematological: Negative.   Psychiatric/Behavioral: Negative.   All other systems reviewed and are negative.     BP 120/60  Pulse 63  Ht 6' (1.829 m)  Wt 180 lb 4 oz (81.761 kg)  BMI 24.44 kg/m2  Physical Exam  Nursing note and vitals reviewed. Constitutional: He is oriented to person, place, and time. He appears well-developed and well-nourished.  HENT:  Head: Normocephalic.  Nose: Nose normal.  Mouth/Throat: Oropharynx is clear and moist.  Eyes: Conjunctivae are normal. Pupils are equal, round, and reactive to light.  Neck: Normal range of motion. Neck supple. No JVD present. Carotid bruit is present.  Cardiovascular: Normal rate, regular rhythm, S1 normal, S2 normal and intact distal pulses.  Exam reveals no gallop and no friction rub.   Murmur heard.  Crescendo systolic murmur is present with a grade of 2/6  Pulmonary/Chest: Effort normal and breath sounds normal. No respiratory distress. He has no wheezes. He has no rales. He exhibits no tenderness.  Abdominal: Soft. Bowel sounds are normal. He exhibits no distension. There is no tenderness.  Musculoskeletal: Normal range of motion. He exhibits no edema and no tenderness.   Lymphadenopathy:    He has no cervical adenopathy.  Neurological: He is alert and oriented to person, place, and time. Coordination normal.  Skin: Skin is warm and dry. No rash noted. No erythema.  Psychiatric: He has a normal mood and affect. His behavior is normal. Judgment and thought content normal.      Assessment and Plan

## 2013-01-12 NOTE — Assessment & Plan Note (Signed)
Currently with no symptoms of angina. High risk of worsening coronary disease given poor controlled diabetes. He is willing to restart Crestor. Suggest he call us for any shortness of breath or chest discomfort with exertion.

## 2013-01-12 NOTE — Assessment & Plan Note (Signed)
Would agree with scheduling for carotid ultrasound. He is high risk of aggressive disease given his cholesterol 300.

## 2013-01-31 ENCOUNTER — Telehealth: Payer: Self-pay

## 2013-01-31 NOTE — Telephone Encounter (Signed)
Yes, either here or wherever available, ie pharmacy etc.

## 2013-01-31 NOTE — Telephone Encounter (Signed)
Regular flu shot

## 2013-01-31 NOTE — Telephone Encounter (Signed)
Mrs Frana left v/m wanting to know if Dr Damita Dunnings is recommending the flu shot for seniors.Please advise.

## 2013-01-31 NOTE — Telephone Encounter (Signed)
Patient's wife notified as instructed by telephone.

## 2013-01-31 NOTE — Telephone Encounter (Signed)
Please clarify which flu vaccine.

## 2013-02-09 ENCOUNTER — Encounter (INDEPENDENT_AMBULATORY_CARE_PROVIDER_SITE_OTHER): Payer: Medicare Other

## 2013-02-09 DIAGNOSIS — I6529 Occlusion and stenosis of unspecified carotid artery: Secondary | ICD-10-CM

## 2013-03-19 ENCOUNTER — Other Ambulatory Visit: Payer: Self-pay | Admitting: Cardiovascular Disease

## 2013-03-19 ENCOUNTER — Other Ambulatory Visit: Payer: Self-pay | Admitting: *Deleted

## 2013-03-19 MED ORDER — LISINOPRIL 20 MG PO TABS: 20.0000 mg | ORAL_TABLET | Freq: Every day | ORAL | Status: DC

## 2013-03-19 NOTE — Telephone Encounter (Signed)
Requested Prescriptions   Signed Prescriptions Disp Refills  . lisinopril (PRINIVIL,ZESTRIL) 20 MG tablet 30 tablet 3    Sig: Take 1 tablet (20 mg total) by mouth daily.    Authorizing Provider: Minna Merritts    Ordering User: Britt Bottom

## 2013-05-02 ENCOUNTER — Telehealth: Payer: Self-pay

## 2013-05-02 MED ORDER — EZETIMIBE 10 MG PO TABS: 10.0000 mg | ORAL_TABLET | Freq: Every day | ORAL | Status: DC

## 2013-05-02 NOTE — Telephone Encounter (Signed)
Pt states DR Rockey Situ gave him samples of Zetia 10 mg. Pt asks if he still wants him to take this. He has not had any side effects and would like to take this. [facility].

## 2013-05-02 NOTE — Telephone Encounter (Signed)
Spoke w/ pt.  He reports that he is almost out of samples of Zetia and would like a new rx sent in.  Sent 90 day supply to pt's pharmacy.

## 2013-07-11 ENCOUNTER — Ambulatory Visit (INDEPENDENT_AMBULATORY_CARE_PROVIDER_SITE_OTHER): Payer: Commercial Managed Care - HMO | Admitting: Cardiovascular Disease

## 2013-07-11 ENCOUNTER — Encounter: Payer: Self-pay | Admitting: Cardiovascular Disease

## 2013-07-11 VITALS — BP 128/78 | HR 64 | Ht 73.0 in | Wt 180.5 lb

## 2013-07-11 DIAGNOSIS — I358 Other nonrheumatic aortic valve disorders: Secondary | ICD-10-CM

## 2013-07-11 DIAGNOSIS — I6529 Occlusion and stenosis of unspecified carotid artery: Secondary | ICD-10-CM

## 2013-07-11 DIAGNOSIS — E7800 Pure hypercholesterolemia, unspecified: Secondary | ICD-10-CM

## 2013-07-11 DIAGNOSIS — I739 Peripheral vascular disease, unspecified: Secondary | ICD-10-CM

## 2013-07-11 DIAGNOSIS — I359 Nonrheumatic aortic valve disorder, unspecified: Secondary | ICD-10-CM

## 2013-07-11 DIAGNOSIS — I2510 Atherosclerotic heart disease of native coronary artery without angina pectoris: Secondary | ICD-10-CM

## 2013-07-11 NOTE — Assessment & Plan Note (Signed)
He denies having claudication-type symptoms when walking. ABIs were offered

## 2013-07-11 NOTE — Patient Instructions (Signed)
You are doing well. No medication changes were made.  We will  Check lipids today  Please call us if you have new issues that need to be addressed before your next appt.  Your physician wants you to follow-up in: 6 months.  You will receive a reminder letter in the mail two months in advance. If you don't receive a letter, please call our office to schedule the follow-up appointment.

## 2013-07-11 NOTE — Progress Notes (Signed)
Patient ID: Kenneth Solian., male    DOB: Aug 11, 1931, 80 y.o.   MRN: [id number]  HPI Comments: [name] is a pleasant gentleman who has a history of coronary artery disease status post coronary artery bypass grafting in 1983, peripheral vascular disease, cerebrovascular disease, hypertension, hyperlipidemia.  Last Myoview in June of 2010 revealed  mild inferior ischemia.  treated  medically.  Also note the patient had ABIs performed several years ago,  There was a greater than 50% mid left SFA stenosis and  the left ABI was in the moderate range.   Echocardiogram performed on Aug 09, 2007 showed normal LV function and trivial mitral and tricuspid regurgitation.  Abdominal ultrasound in March of 2008 showed no aneurysm. There is moderate flow reduction in the left common iliac.  Last carotid Dopplers in several years ago showed a 40-59% right and 60-79% left stenosis.  In followup today, [name] reports that [name] is doing well. [name] is active, denies any chest pain or shortness of breath with exertion.  Legs are tired when [name] walks but [name] reports no significant change from prior years. No significant claudication type symptoms. [name] stopped his cholesterol medication in the past and cholesterol climbed up to 300 . Reports that [name] is been taking Crestor 10 mg daily with  zetia 10 mg daily . [name] does not do any regular exercise but does stay active.  EKG shows normal sinus rhythm with rate 64 beats per minute, rare APC   Outpatient Encounter Prescriptions as of 07/11/2013  Medication Sig  . aspirin EC 81 MG EC tablet Take 81 mg by mouth daily.    . Cholecalciferol (VITAMIN D3) 1000 UNITS tablet Take 1,000 Units by mouth daily.    [name] ezetimibe (ZETIA) 10 MG tablet Take 1 tablet (10 mg total) by mouth daily.  . fexofenadine (ALLEGRA) 180 MG tablet daily as needed. Take one to one half tablet daily  . fluticasone (FLONASE) 50 MCG/ACT nasal spray 2 sprays by Nasal route daily as needed.   [name] ibuprofen (ADVIL) 200  MG tablet as needed.    [name] lisinopril (PRINIVIL,ZESTRIL) 20 MG tablet TAKE 1 TABLET (20 MG TOTAL) BY MOUTH DAILY.  [name] lisinopril (PRINIVIL,ZESTRIL) 20 MG tablet Take 1 tablet (20 mg total) by mouth daily.  . meclizine (ANTIVERT) 25 MG tablet Take 25 mg by mouth every 6 (six) hours as needed.   . Omega-3 Fatty Acids (FISH OIL) 1000 MG CAPS Twice weekly.  [name] omeprazole (PRILOSEC) 40 MG capsule Take 1 capsule (40 mg total) by mouth daily.  . rosuvastatin (CRESTOR) 10 MG tablet Take 10 mg by mouth daily.    Review of Systems  Constitutional: Negative.   HENT: Negative.   Eyes: Negative.   Respiratory: Negative.   Cardiovascular: Negative.   Gastrointestinal: Negative.   Endocrine: Negative.   Musculoskeletal: Negative.   Skin: Negative.   Allergic/Immunologic: Negative.   Neurological: Negative.   Hematological: Negative.   Psychiatric/Behavioral: Negative.   All other systems reviewed and are negative.    BP 128/78  Pulse 64  Ht 6\' 1"  (1.854 m)  Wt 180 lb 8 oz (81.874 kg)  BMI 23.82 kg/m2  Physical Exam  Nursing note and vitals reviewed. Constitutional: [name] is oriented to person, place, and time. [name] appears well-developed and well-nourished.  HENT:  Head: Normocephalic.  Nose: Nose normal.  Mouth/Throat: Oropharynx is clear and moist.  Eyes: Conjunctivae are normal. Pupils are equal, round, and reactive to light.  Neck: Normal range  of motion. Neck supple. No JVD present. Carotid bruit is present.  Cardiovascular: Normal rate, regular rhythm, S1 normal, S2 normal and intact distal pulses.  Exam reveals no gallop and no friction rub.   Murmur heard.  Crescendo systolic murmur is present with a grade of 2/6  Pulmonary/Chest: Effort normal and breath sounds normal. No respiratory distress. [name] has no wheezes. [name] has no rales. [name] exhibits no tenderness.  Abdominal: Soft. Bowel sounds are normal. [name] exhibits no distension. There is no tenderness.  Musculoskeletal: Normal range of  motion. [name] exhibits no edema and no tenderness.  Lymphadenopathy:    [name] has no cervical adenopathy.  Neurological: [name] is alert and oriented to person, place, and time. Coordination normal.  Skin: Skin is warm and dry. No rash noted. No erythema.  Psychiatric: [name] has a normal mood and affect. His behavior is normal. Judgment and thought content normal.      Assessment and Plan

## 2013-07-11 NOTE — Assessment & Plan Note (Signed)
Lipid panel pending. Recommended he stay on his Crestor and zetia daily

## 2013-07-11 NOTE — Assessment & Plan Note (Signed)
Currently with no symptoms of angina. No further workup at this time. Continue current medication regimen. 

## 2013-07-11 NOTE — Assessment & Plan Note (Signed)
Significant but stable carotid arterial disease. Recommended recheck his cholesterol closely, goal LDL less than 70

## 2013-07-12 LAB — HEPATIC FUNCTION PANEL
ALK PHOS: 56 IU/L (ref 39–117)
ALT: 11 IU/L (ref 0–44)
AST: 18 IU/L (ref 0–40)
Albumin: 4.5 g/dL (ref 3.5–4.7)
BILIRUBIN DIRECT: 0.12 mg/dL (ref 0.00–0.40)
Total Bilirubin: 0.4 mg/dL (ref 0.0–1.2)
Total Protein: 7 g/dL (ref 6.0–8.5)

## 2013-07-12 LAB — LIPID PANEL
CHOLESTEROL TOTAL: 206 mg/dL — AB (ref 100–199)
Chol/HDL Ratio: 3 ratio units (ref 0.0–5.0)
HDL: 68 mg/dL (ref >39)
LDL Calculated: 88 mg/dL (ref 0–99)
TRIGLYCERIDES: 248 mg/dL — AB (ref 0–149)
VLDL Cholesterol Cal: 50 mg/dL — ABNORMAL HIGH (ref 5–40)

## 2013-07-18 ENCOUNTER — Other Ambulatory Visit: Payer: Self-pay

## 2013-07-18 MED ORDER — ROSUVASTATIN CALCIUM 20 MG PO TABS: 20.0000 mg | ORAL_TABLET | Freq: Every day | ORAL | Status: DC

## 2013-07-27 ENCOUNTER — Other Ambulatory Visit: Payer: Self-pay | Admitting: Urology

## 2013-08-06 ENCOUNTER — Ambulatory Visit (INDEPENDENT_AMBULATORY_CARE_PROVIDER_SITE_OTHER): Payer: Commercial Managed Care - HMO | Admitting: Family Medicine

## 2013-08-06 ENCOUNTER — Encounter: Payer: Self-pay | Admitting: Family Medicine

## 2013-08-06 VITALS — BP 158/80 | HR 66 | Temp 97.7°F | Wt 180.5 lb

## 2013-08-06 DIAGNOSIS — I10 Essential (primary) hypertension: Secondary | ICD-10-CM

## 2013-08-06 DIAGNOSIS — L989 Disorder of the skin and subcutaneous tissue, unspecified: Secondary | ICD-10-CM

## 2013-08-06 NOTE — Progress Notes (Signed)
Pre visit review using our clinic review tool, if applicable. No additional management support is needed unless otherwise documented below in the visit note.  Has uro f/u for possible TURP pending.  Was advised to f/u re: BP and BP medicine.  BP was wnl at cards eval in 07/2013.  No CP, SOB, BLE edema.    Irritated skin lesion behind L ear.  Not healing. Small, 14mm.  Occ bleeds.   Meds, vitals, and allergies reviewed.   ROS: See HPI.  Otherwise, noncontributory.  GEN: nad, alert and oriented HEENT: mucous membranes moist NECK: supple w/o LA CV: rrr. PULM: ctab, no inc wob ABD: soft, +bs EXT: no edema SKIN: no acute rash 74mm irritated lesion behind L ear, frozen with liq n2 x3.  Tolerated well.

## 2013-08-06 NOTE — Patient Instructions (Signed)
Check your BP in the AM a few times.  Let me know about the numbers.  I'll check with uro in the meantime.  Take care.  The skin lesion should heal up.  If not, then let me know.

## 2013-08-07 ENCOUNTER — Telehealth: Payer: Self-pay | Admitting: Family Medicine

## 2013-08-07 DIAGNOSIS — L989 Disorder of the skin and subcutaneous tissue, unspecified: Secondary | ICD-10-CM | POA: Insufficient documentation

## 2013-08-07 NOTE — Assessment & Plan Note (Signed)
I'll check with uro re: possible change in BP med.  He'll check BP at home in the meantime and update me.  BP prev controlled at cards visit earlier in 2015.  Pt agrees with plan.

## 2013-08-07 NOTE — Assessment & Plan Note (Signed)
Likely irritated AK, frozen x3 with liq N2, f/u if not healed.  He agrees.  No complication.

## 2013-08-07 NOTE — Telephone Encounter (Signed)
Please try to get a message through to Dr. Gaynelle Arabian at [facility].  I need some advice on why he would need a change from ACE to ARB, assuming his BP can be controlled with ACE.  Thanks.

## 2013-08-07 NOTE — Telephone Encounter (Signed)
Left message with receptionist.  She took patient's name and our phone number to have nurse return call.

## 2013-08-08 ENCOUNTER — Other Ambulatory Visit: Payer: Self-pay | Admitting: Internal Medicine

## 2013-08-08 NOTE — Telephone Encounter (Signed)
Noted, thanks!

## 2013-08-09 ENCOUNTER — Telehealth: Payer: Self-pay

## 2013-08-09 NOTE — Telephone Encounter (Signed)
Call pt.  I talked to Dr. Gaynelle Arabian. No need to change meds.  I think he likely has a white coat component.  Would just continue as is.  Thanks.

## 2013-08-09 NOTE — Telephone Encounter (Signed)
Pt was seen on 08/06/13; on 08/07/13 at 8:10 AM BP was 151/83 P 65;  08/08/13 at 6:15 AM BP was 141/76 P 62 and today at 9 AM BP was 128/63 P 62. Pt request cb if should continue med as presently taking. [facility].

## 2013-08-10 ENCOUNTER — Other Ambulatory Visit: Payer: Self-pay | Admitting: Urology

## 2013-08-10 NOTE — Telephone Encounter (Signed)
Patient advised.

## 2013-08-21 ENCOUNTER — Encounter (HOSPITAL_COMMUNITY): Payer: Self-pay | Admitting: Pharmacy Technician

## 2013-08-24 ENCOUNTER — Other Ambulatory Visit (HOSPITAL_COMMUNITY): Payer: Self-pay | Admitting: Urology

## 2013-08-24 NOTE — Progress Notes (Signed)
[name] dr Rockey Situ 4/15 epic

## 2013-08-24 NOTE — Patient Instructions (Addendum)
Your procedure is scheduled on:  09/03/13  MONDAY   Report to [facility] at  0930     AM.  Call this number if you have problems the morning of surgery: [phone]        Do not eat food  Or drink :After Midnight.SUNDAY NIGHT   Take these medicines the morning of surgery with A SIP OF WATER: omeprazole   .  Contacts, dentures or partial plates, or metal hairpins  can not be worn to surgery. Your family will be responsible for glasses, dentures, hearing aides while you are in surgery  Leave suitcase in the car. After surgery it may be brought to your room.  For patients admitted to the hospital, checkout time is 11:00 AM day of  discharge.             DO NOT WEAR JEWELRY, LOTIONS, POWDERS, OR PERFUMES.  WOMEN-- DO NOT SHAVE LEGS OR UNDERARMS FOR 48 HOURS BEFORE SHOWERS. MEN MAY SHAVE FACE.  Apolonio Schneiders Haroldine Redler RN [phone]                                                                                                          [facility] - Preparing for Surgery Before surgery, you can play an important role.  Because skin is not sterile, your skin needs to be as free of germs as possible.  You can reduce the number of germs on your skin by washing with CHG (chlorahexidine gluconate) soap before surgery.  CHG is an antiseptic cleaner which kills germs and bonds with the skin to continue killing germs even after washing. Please DO NOT use if you have an allergy to CHG or antibacterial soaps.  If your skin becomes reddened/irritated stop using the CHG and inform your nurse when you arrive at Short Stay. Do not shave (including legs and underarms) for at least 48 hours prior to the first CHG shower.  You may shave your face/neck. Please follow these instructions carefully:  1.  Shower with CHG Soap the night before surgery and the  morning of Surgery.  2.  If you choose to wash your hair, wash your hair first as usual with your  normal  shampoo.  3.  After you shampoo, rinse  your hair and body thoroughly to remove the  shampoo.                            4.  Use CHG as you would any other liquid soap.  You can apply chg directly  to the skin and wash                       Gently with a scrungie or clean washcloth.  5.  Apply the CHG Soap to your body ONLY FROM THE NECK DOWN.   Do not use on face/ open  Wound or open sores. Avoid contact with eyes, ears mouth and genitals (private parts).                       Wash face,  Genitals (private parts) with your normal soap.             6.  Wash thoroughly, paying special attention to the area where your surgery  will be performed.  7.  Thoroughly rinse your body with warm water from the neck down.  8.  DO NOT shower/wash with your normal soap after using and rinsing off  the CHG Soap.                9.  Pat yourself dry with a clean towel.            10.  Wear clean pajamas.            11.  Place clean sheets on your bed the night of your first shower and do not  sleep with pets. Day of Surgery : Do not apply any lotions/deodorants the morning of surgery.  Please wear clean clothes to the hospital/surgery center.  FAILURE TO FOLLOW THESE INSTRUCTIONS MAY RESULT IN THE CANCELLATION OF YOUR SURGERY PATIENT SIGNATURE_________________________________  NURSE SIGNATURE__________________________________  ________________________________________________________________________

## 2013-08-28 ENCOUNTER — Ambulatory Visit (HOSPITAL_COMMUNITY)
Admission: RE | Admit: 2013-08-28 | Discharge: 2013-08-28 | Disposition: A | Discharge status: Home / Self Care | Payer: Medicare HMO | Source: Ambulatory Visit | Attending: Anesthesiology | Admitting: Anesthesiology

## 2013-08-28 ENCOUNTER — Encounter (HOSPITAL_COMMUNITY)
Admission: RE | Admit: 2013-08-28 | Discharge: 2013-08-28 | Disposition: A | Discharge status: Home / Self Care | Payer: Medicare HMO | Source: Ambulatory Visit | Attending: Urology | Admitting: Urology

## 2013-08-28 ENCOUNTER — Encounter (HOSPITAL_COMMUNITY): Payer: Self-pay

## 2013-08-28 ENCOUNTER — Encounter (INDEPENDENT_AMBULATORY_CARE_PROVIDER_SITE_OTHER): Payer: Self-pay

## 2013-08-28 DIAGNOSIS — K449 Diaphragmatic hernia without obstruction or gangrene: Secondary | ICD-10-CM | POA: Insufficient documentation

## 2013-08-28 DIAGNOSIS — Z01818 Encounter for other preprocedural examination: Secondary | ICD-10-CM | POA: Insufficient documentation

## 2013-08-28 HISTORY — DX: Other specified postprocedural states: Z98.890

## 2013-08-28 HISTORY — DX: Dizziness and giddiness: R42

## 2013-08-28 HISTORY — DX: Unspecified hemorrhoids: K64.9

## 2013-08-28 HISTORY — DX: Personal history of other diseases of the digestive system: Z87.19

## 2013-08-28 HISTORY — DX: Unspecified cataract: H26.9

## 2013-08-28 HISTORY — DX: Cardiac murmur, unspecified: R01.1

## 2013-08-28 HISTORY — DX: Other specified postprocedural states: R11.2

## 2013-08-28 HISTORY — DX: Other specified disorders of eye and adnexa: H57.89

## 2013-08-28 HISTORY — DX: Other seasonal allergic rhinitis: J30.2

## 2013-08-28 LAB — CBC
HCT: 46.5 % (ref 39.0–52.0)
Hemoglobin: 15.3 g/dL (ref 13.0–17.0)
MCH: 29.9 pg (ref 26.0–34.0)
MCHC: 32.9 g/dL (ref 30.0–36.0)
MCV: 90.8 fL (ref 78.0–100.0)
PLATELETS: 274 10*3/uL (ref 150–400)
RBC: 5.12 MIL/uL (ref 4.22–5.81)
RDW: 13.7 % (ref 11.5–15.5)
WBC: 9.4 10*3/uL (ref 4.0–10.5)

## 2013-08-28 LAB — BASIC METABOLIC PANEL
BUN: 28 mg/dL — ABNORMAL HIGH (ref 6–23)
CALCIUM: 10.3 mg/dL (ref 8.4–10.5)
CHLORIDE: 97 meq/L (ref 96–112)
CO2: 25 mEq/L (ref 19–32)
Creatinine, Ser: 1.58 mg/dL — ABNORMAL HIGH (ref 0.50–1.35)
GFR calc non Af Amer: 40 mL/min — ABNORMAL LOW (ref >90)
GFR, EST AFRICAN AMERICAN: 46 mL/min — AB (ref >90)
Glucose, Bld: 88 mg/dL (ref 70–99)
Potassium: 5.4 mEq/L — ABNORMAL HIGH (ref 3.7–5.3)
Sodium: 137 mEq/L (ref 137–147)

## 2013-08-29 LAB — PSA: PSA: 1.89 ng/mL (ref <=4.00)

## 2013-09-03 ENCOUNTER — Encounter (HOSPITAL_COMMUNITY): Payer: Medicare HMO | Admitting: Certified Registered Nurse Anesthetist

## 2013-09-03 ENCOUNTER — Inpatient Hospital Stay (HOSPITAL_COMMUNITY)
Admission: RE | Admit: 2013-09-03 | Discharge: 2013-09-05 | DRG: 667 | Disposition: A | Discharge status: Home / Self Care | Payer: Medicare HMO | Source: Ambulatory Visit | Attending: Urology | Admitting: Urology

## 2013-09-03 ENCOUNTER — Ambulatory Visit (HOSPITAL_COMMUNITY): Payer: Medicare HMO | Admitting: Certified Registered Nurse Anesthetist

## 2013-09-03 ENCOUNTER — Encounter (HOSPITAL_COMMUNITY): Payer: Self-pay

## 2013-09-03 ENCOUNTER — Encounter (HOSPITAL_COMMUNITY)
Admission: RE | Disposition: A | Discharge status: Home / Self Care | Payer: Self-pay | Source: Ambulatory Visit | Attending: Urology

## 2013-09-03 DIAGNOSIS — I10 Essential (primary) hypertension: Secondary | ICD-10-CM | POA: Diagnosis present

## 2013-09-03 DIAGNOSIS — N139 Obstructive and reflux uropathy, unspecified: Secondary | ICD-10-CM | POA: Diagnosis present

## 2013-09-03 DIAGNOSIS — R310 Gross hematuria: Secondary | ICD-10-CM | POA: Diagnosis present

## 2013-09-03 DIAGNOSIS — I2510 Atherosclerotic heart disease of native coronary artery without angina pectoris: Secondary | ICD-10-CM | POA: Diagnosis present

## 2013-09-03 DIAGNOSIS — Z951 Presence of aortocoronary bypass graft: Secondary | ICD-10-CM

## 2013-09-03 DIAGNOSIS — N138 Other obstructive and reflux uropathy: Secondary | ICD-10-CM | POA: Diagnosis present

## 2013-09-03 DIAGNOSIS — C679 Malignant neoplasm of bladder, unspecified: Secondary | ICD-10-CM

## 2013-09-03 DIAGNOSIS — R339 Retention of urine, unspecified: Secondary | ICD-10-CM | POA: Diagnosis present

## 2013-09-03 DIAGNOSIS — Z79899 Other long term (current) drug therapy: Secondary | ICD-10-CM

## 2013-09-03 DIAGNOSIS — N400 Enlarged prostate without lower urinary tract symptoms: Secondary | ICD-10-CM | POA: Diagnosis present

## 2013-09-03 DIAGNOSIS — K219 Gastro-esophageal reflux disease without esophagitis: Secondary | ICD-10-CM | POA: Diagnosis present

## 2013-09-03 DIAGNOSIS — M109 Gout, unspecified: Secondary | ICD-10-CM | POA: Diagnosis present

## 2013-09-03 DIAGNOSIS — Z7982 Long term (current) use of aspirin: Secondary | ICD-10-CM

## 2013-09-03 DIAGNOSIS — N401 Enlarged prostate with lower urinary tract symptoms: Secondary | ICD-10-CM | POA: Diagnosis present

## 2013-09-03 DIAGNOSIS — N323 Diverticulum of bladder: Secondary | ICD-10-CM | POA: Diagnosis present

## 2013-09-03 DIAGNOSIS — Z87891 Personal history of nicotine dependence: Secondary | ICD-10-CM

## 2013-09-03 DIAGNOSIS — E7800 Pure hypercholesterolemia, unspecified: Secondary | ICD-10-CM | POA: Diagnosis present

## 2013-09-03 DIAGNOSIS — C672 Malignant neoplasm of lateral wall of bladder: Principal | ICD-10-CM | POA: Diagnosis present

## 2013-09-03 HISTORY — PX: CYSTOGRAM: SHX6285

## 2013-09-03 HISTORY — PX: TRANSURETHRAL RESECTION OF BLADDER TUMOR: SHX2575

## 2013-09-03 HISTORY — PX: TRANSURETHRAL RESECTION OF PROSTATE: SHX73

## 2013-09-03 SURGERY — CYSTOGRAM
Anesthesia: General

## 2013-09-03 MED ORDER — IBUPROFEN 200 MG PO TABS
200.0000 mg | ORAL_TABLET | Freq: Four times a day (QID) | ORAL | Status: DC | PRN
  Filled 2013-09-03: qty 1

## 2013-09-03 MED ORDER — BELLADONNA ALKALOIDS-OPIUM 16.2-60 MG RE SUPP
RECTAL | Status: AC
  Filled 2013-09-03: qty 1

## 2013-09-03 MED ORDER — OMEGA-3-ACID ETHYL ESTERS 1 G PO CAPS
2.0000 g | ORAL_CAPSULE | Freq: Every day | ORAL | Status: DC
  Administered 2013-09-03 – 2013-09-04 (×2): 2 g via ORAL
  Filled 2013-09-03 (×3): qty 2

## 2013-09-03 MED ORDER — LORATADINE 10 MG PO TABS
10.0000 mg | ORAL_TABLET | Freq: Every day | ORAL | Status: DC
  Administered 2013-09-03 – 2013-09-04 (×2): 10 mg via ORAL
  Filled 2013-09-03 (×3): qty 1

## 2013-09-03 MED ORDER — CHLORHEXIDINE GLUCONATE 0.12 % MT SOLN
15.0000 mL | Freq: Two times a day (BID) | OROMUCOSAL | Status: DC
  Administered 2013-09-03 – 2013-09-04 (×3): 15 mL via OROMUCOSAL
  Filled 2013-09-03 (×7): qty 15

## 2013-09-03 MED ORDER — PANTOPRAZOLE SODIUM 40 MG PO TBEC
40.0000 mg | DELAYED_RELEASE_TABLET | Freq: Every day | ORAL | Status: DC
  Administered 2013-09-04 (×2): 40 mg via ORAL
  Filled 2013-09-03 (×3): qty 1

## 2013-09-03 MED ORDER — ESMOLOL HCL 10 MG/ML IV SOLN
INTRAVENOUS | Status: AC
  Filled 2013-09-03: qty 10

## 2013-09-03 MED ORDER — SODIUM CHLORIDE 0.9 % IR SOLN
3000.0000 mL | Status: DC
  Administered 2013-09-03 (×2): 3000 mL

## 2013-09-03 MED ORDER — FLUTICASONE PROPIONATE 50 MCG/ACT NA SUSP
2.0000 | Freq: Every day | NASAL | Status: DC | PRN
  Filled 2013-09-03: qty 16

## 2013-09-03 MED ORDER — ONDANSETRON HCL 4 MG/2ML IJ SOLN
INTRAMUSCULAR | Status: AC
  Filled 2013-09-03: qty 2

## 2013-09-03 MED ORDER — MECLIZINE HCL 25 MG PO TABS
25.0000 mg | ORAL_TABLET | Freq: Four times a day (QID) | ORAL | Status: DC | PRN
  Filled 2013-09-03: qty 1

## 2013-09-03 MED ORDER — LIDOCAINE HCL (CARDIAC) 20 MG/ML IV SOLN
INTRAVENOUS | Status: DC | PRN
  Administered 2013-09-03: 100 mg via INTRAVENOUS

## 2013-09-03 MED ORDER — EPHEDRINE SULFATE 50 MG/ML IJ SOLN
INTRAMUSCULAR | Status: AC
  Filled 2013-09-03: qty 1

## 2013-09-03 MED ORDER — FENTANYL CITRATE 0.05 MG/ML IJ SOLN
INTRAMUSCULAR | Status: AC
  Filled 2013-09-03: qty 2

## 2013-09-03 MED ORDER — ACETAMINOPHEN 325 MG PO TABS: 650.0000 mg | ORAL_TABLET | ORAL | Status: DC | PRN

## 2013-09-03 MED ORDER — BACITRACIN-NEOMYCIN-POLYMYXIN 400-5-5000 EX OINT: 1.0000 "application " | TOPICAL_OINTMENT | Freq: Three times a day (TID) | CUTANEOUS | Status: DC | PRN

## 2013-09-03 MED ORDER — SENNOSIDES-DOCUSATE SODIUM 8.6-50 MG PO TABS
2.0000 | ORAL_TABLET | Freq: Every day | ORAL | Status: DC
  Administered 2013-09-03: 2 via ORAL
  Filled 2013-09-03 (×5): qty 2

## 2013-09-03 MED ORDER — BISACODYL 10 MG RE SUPP: 10.0000 mg | Freq: Every day | RECTAL | Status: DC | PRN

## 2013-09-03 MED ORDER — DIPHENHYDRAMINE HCL 12.5 MG/5ML PO ELIX: 12.5000 mg | ORAL_SOLUTION | Freq: Four times a day (QID) | ORAL | Status: DC | PRN

## 2013-09-03 MED ORDER — BELLADONNA ALKALOIDS-OPIUM 16.2-60 MG RE SUPP
1.0000 | Freq: Four times a day (QID) | RECTAL | Status: DC | PRN
  Administered 2013-09-03: 1 via RECTAL
  Filled 2013-09-03: qty 1

## 2013-09-03 MED ORDER — GLYCOPYRROLATE 0.2 MG/ML IJ SOLN
INTRAMUSCULAR | Status: AC
  Filled 2013-09-03: qty 1

## 2013-09-03 MED ORDER — ESMOLOL HCL 10 MG/ML IV SOLN
INTRAVENOUS | Status: DC | PRN
  Administered 2013-09-03: 10 mg via INTRAVENOUS
  Administered 2013-09-03: 20 mg via INTRAVENOUS

## 2013-09-03 MED ORDER — DEXAMETHASONE SODIUM PHOSPHATE 10 MG/ML IJ SOLN
INTRAMUSCULAR | Status: AC
  Filled 2013-09-03: qty 1

## 2013-09-03 MED ORDER — PROMETHAZINE HCL 25 MG/ML IJ SOLN: 6.2500 mg | INTRAMUSCULAR | Status: DC | PRN

## 2013-09-03 MED ORDER — DIPHENHYDRAMINE HCL 50 MG/ML IJ SOLN: 12.5000 mg | Freq: Four times a day (QID) | INTRAMUSCULAR | Status: DC | PRN

## 2013-09-03 MED ORDER — SODIUM CHLORIDE 0.45 % IV SOLN
INTRAVENOUS | Status: DC
  Administered 2013-09-03: 15:00:00 via INTRAVENOUS

## 2013-09-03 MED ORDER — PROPOFOL 10 MG/ML IV BOLUS
INTRAVENOUS | Status: DC | PRN
  Administered 2013-09-03: 200 mg via INTRAVENOUS

## 2013-09-03 MED ORDER — PROPOFOL 10 MG/ML IV BOLUS
INTRAVENOUS | Status: AC
  Filled 2013-09-03: qty 20

## 2013-09-03 MED ORDER — HYPROMELLOSE (GONIOSCOPIC) 2.5 % OP SOLN
1.0000 [drp] | Freq: Three times a day (TID) | OPHTHALMIC | Status: DC | PRN
  Filled 2013-09-03: qty 15

## 2013-09-03 MED ORDER — MEPERIDINE HCL 50 MG/ML IJ SOLN: 6.2500 mg | INTRAMUSCULAR | Status: DC | PRN

## 2013-09-03 MED ORDER — BIOTENE DRY MOUTH MT LIQD
15.0000 mL | Freq: Two times a day (BID) | OROMUCOSAL | Status: DC
  Administered 2013-09-04: 15 mL via OROMUCOSAL

## 2013-09-03 MED ORDER — CEFAZOLIN SODIUM-DEXTROSE 2-3 GM-% IV SOLR
INTRAVENOUS | Status: AC
  Filled 2013-09-03: qty 50

## 2013-09-03 MED ORDER — CIPROFLOXACIN HCL 500 MG PO TABS
500.0000 mg | ORAL_TABLET | Freq: Two times a day (BID) | ORAL | Status: DC
  Administered 2013-09-03 – 2013-09-04 (×3): 500 mg via ORAL
  Filled 2013-09-03 (×6): qty 1

## 2013-09-03 MED ORDER — VITAMIN D3 25 MCG (1000 UNIT) PO TABS
1000.0000 [IU] | ORAL_TABLET | Freq: Every day | ORAL | Status: DC
  Administered 2013-09-03 – 2013-09-04 (×2): 1000 [IU] via ORAL
  Filled 2013-09-03 (×3): qty 1

## 2013-09-03 MED ORDER — GLYCOPYRROLATE 0.2 MG/ML IJ SOLN
INTRAMUSCULAR | Status: DC | PRN
  Administered 2013-09-03: 0.2 mg via INTRAVENOUS

## 2013-09-03 MED ORDER — FENTANYL CITRATE 0.05 MG/ML IJ SOLN
25.0000 ug | INTRAMUSCULAR | Status: DC | PRN
  Administered 2013-09-03: 25 ug via INTRAVENOUS
  Administered 2013-09-03: 50 ug via INTRAVENOUS
  Administered 2013-09-03 (×3): 25 ug via INTRAVENOUS

## 2013-09-03 MED ORDER — POLYVINYL ALCOHOL 1.4 % OP SOLN
1.0000 [drp] | Freq: Three times a day (TID) | OPHTHALMIC | Status: DC | PRN
  Filled 2013-09-03: qty 15

## 2013-09-03 MED ORDER — DEXAMETHASONE SODIUM PHOSPHATE 10 MG/ML IJ SOLN
INTRAMUSCULAR | Status: DC | PRN
  Administered 2013-09-03: 10 mg via INTRAVENOUS

## 2013-09-03 MED ORDER — HYDROMORPHONE HCL PF 1 MG/ML IJ SOLN
0.5000 mg | INTRAMUSCULAR | Status: DC | PRN
  Administered 2013-09-03 – 2013-09-04 (×4): 1 mg via INTRAVENOUS
  Filled 2013-09-03 (×4): qty 1

## 2013-09-03 MED ORDER — ATORVASTATIN CALCIUM 10 MG PO TABS
10.0000 mg | ORAL_TABLET | Freq: Every day | ORAL | Status: DC
Start: 2013-09-03 — End: 2013-09-05
  Administered 2013-09-03: 10 mg via ORAL
  Filled 2013-09-03 (×3): qty 1

## 2013-09-03 MED ORDER — ONDANSETRON HCL 4 MG/2ML IJ SOLN
4.0000 mg | INTRAMUSCULAR | Status: DC | PRN
  Administered 2013-09-03 – 2013-09-04 (×4): 4 mg via INTRAVENOUS
  Filled 2013-09-03 (×4): qty 2

## 2013-09-03 MED ORDER — LISINOPRIL 20 MG PO TABS
20.0000 mg | ORAL_TABLET | Freq: Every morning | ORAL | Status: DC
  Administered 2013-09-04: 20 mg via ORAL
  Filled 2013-09-03 (×2): qty 1

## 2013-09-03 MED ORDER — PHENYLEPHRINE HCL 10 MG/ML IJ SOLN
INTRAMUSCULAR | Status: DC | PRN
  Administered 2013-09-03: 80 ug via INTRAVENOUS

## 2013-09-03 MED ORDER — BELLADONNA ALKALOIDS-OPIUM 16.2-60 MG RE SUPP
RECTAL | Status: DC | PRN
  Administered 2013-09-03: 1 via RECTAL

## 2013-09-03 MED ORDER — PHENYLEPHRINE 40 MCG/ML (10ML) SYRINGE FOR IV PUSH (FOR BLOOD PRESSURE SUPPORT)
PREFILLED_SYRINGE | INTRAVENOUS | Status: AC
  Filled 2013-09-03: qty 10

## 2013-09-03 MED ORDER — LACTATED RINGERS IV SOLN
INTRAVENOUS | Status: DC | PRN
  Administered 2013-09-03: 10:00:00 via INTRAVENOUS

## 2013-09-03 MED ORDER — ASPIRIN EC 81 MG PO TBEC
81.0000 mg | DELAYED_RELEASE_TABLET | Freq: Every morning | ORAL | Status: DC
  Administered 2013-09-04: 81 mg via ORAL
  Filled 2013-09-03 (×2): qty 1

## 2013-09-03 MED ORDER — EPHEDRINE SULFATE 50 MG/ML IJ SOLN
INTRAMUSCULAR | Status: DC | PRN
  Administered 2013-09-03 (×5): 10 mg via INTRAVENOUS

## 2013-09-03 MED ORDER — OXYBUTYNIN CHLORIDE 5 MG PO TABS
5.0000 mg | ORAL_TABLET | Freq: Three times a day (TID) | ORAL | Status: DC | PRN
  Filled 2013-09-03: qty 1

## 2013-09-03 MED ORDER — FENTANYL CITRATE 0.05 MG/ML IJ SOLN
INTRAMUSCULAR | Status: DC | PRN
  Administered 2013-09-03 (×6): 25 ug via INTRAVENOUS

## 2013-09-03 MED ORDER — SODIUM CHLORIDE 0.9 % IJ SOLN
INTRAMUSCULAR | Status: AC
  Filled 2013-09-03: qty 10

## 2013-09-03 MED ORDER — CEFAZOLIN SODIUM-DEXTROSE 2-3 GM-% IV SOLR
2.0000 g | INTRAVENOUS | Status: AC
  Administered 2013-09-03: 2 g via INTRAVENOUS

## 2013-09-03 MED ORDER — ONDANSETRON HCL 4 MG/2ML IJ SOLN
INTRAMUSCULAR | Status: DC | PRN
  Administered 2013-09-03: 4 mg via INTRAVENOUS

## 2013-09-03 MED ORDER — SODIUM CHLORIDE 0.9 % IR SOLN
Status: DC | PRN
  Administered 2013-09-03: 30000 mL

## 2013-09-03 MED ORDER — LIDOCAINE HCL (CARDIAC) 20 MG/ML IV SOLN
INTRAVENOUS | Status: AC
  Filled 2013-09-03: qty 5

## 2013-09-03 MED ORDER — OXYCODONE-ACETAMINOPHEN 5-325 MG PO TABS
1.0000 | ORAL_TABLET | ORAL | Status: DC | PRN
  Administered 2013-09-03 (×2): 2 via ORAL
  Filled 2013-09-03 (×2): qty 2

## 2013-09-03 SURGICAL SUPPLY — 50 items
BAG DRAIN URO-CYSTO SKYTR STRL (DRAIN) ×4 IMPLANT
BAG DRN UROCATH (DRAIN) ×2
BAG URINE DRAINAGE (UROLOGICAL SUPPLIES) IMPLANT
BAG URO CATCHER STRL LF (DRAPE) ×2 IMPLANT
BOOTIES KNEE HIGH SLOAN (MISCELLANEOUS) ×2 IMPLANT
CANISTER SUCT LVC 12 LTR MEDI- (MISCELLANEOUS) IMPLANT
CATH FOLEY 2WAY SLVR  5CC 16FR (CATHETERS) ×2
CATH FOLEY 2WAY SLVR 5CC 16FR (CATHETERS) IMPLANT
CATH HEMA 3WAY 30CC 22FR COUDE (CATHETERS) IMPLANT
CATH HEMA 3WAY 30CC 24FR COUDE (CATHETERS) ×2 IMPLANT
CATH INTERMIT  6FR 70CM (CATHETERS) ×2 IMPLANT
CATH ROBINSON RED A/P 16FR (CATHETERS) ×2 IMPLANT
CLOTH BEACON ORANGE TIMEOUT ST (SAFETY) ×4 IMPLANT
DRAPE CAMERA CLOSED 9X96 (DRAPES) ×4 IMPLANT
ELECT BUTTON HF 24-28F 2 30DE (ELECTRODE) ×2 IMPLANT
ELECT HF RESECT BIPO 24F 45 ND (CUTTING LOOP) IMPLANT
ELECT LOOP MED HF 24F 12D (CUTTING LOOP) ×2 IMPLANT
ELECT LOOP MED HF 24F 12D CBL (CLIP) ×4 IMPLANT
ELECT REM PT RETURN 9FT ADLT (ELECTROSURGICAL)
ELECT RESECT VAPORIZE 12D CBL (ELECTRODE) ×2 IMPLANT
ELECTRODE REM PT RTRN 9FT ADLT (ELECTROSURGICAL) ×2 IMPLANT
GLIDEWIRE 0.025 SS STRAIGHT (WIRE) ×2 IMPLANT
GLOVE BIO SURGEON STRL SZ7.5 (GLOVE) ×2 IMPLANT
GLOVE BIOGEL M STRL SZ7.5 (GLOVE) ×10 IMPLANT
GOWN PREVENTION PLUS LG XLONG (DISPOSABLE) ×4 IMPLANT
GOWN STRL REIN XL XLG (GOWN DISPOSABLE) ×4 IMPLANT
GOWN STRL REUS W/TWL LRG LVL3 (GOWN DISPOSABLE) ×2 IMPLANT
GOWN STRL REUS W/TWL XL LVL3 (GOWN DISPOSABLE) ×6 IMPLANT
GUIDEWIRE STR DUAL SENSOR (WIRE) ×2 IMPLANT
HOLDER FOLEY CATH W/STRAP (MISCELLANEOUS) IMPLANT
IV NS IRRIG 3000ML ARTHROMATIC (IV SOLUTION) ×4 IMPLANT
KIT ASPIRATION TUBING (SET/KITS/TRAYS/PACK) ×4 IMPLANT
MANIFOLD NEPTUNE II (INSTRUMENTS) ×4 IMPLANT
NDL SAFETY ECLIPSE 18X1.5 (NEEDLE) IMPLANT
NDL SPNL 22GX7 QUINCKE BK (NEEDLE) IMPLANT
NEEDLE HYPO 18GX1.5 SHARP (NEEDLE)
NEEDLE HYPO 22GX1.5 SAFETY (NEEDLE) IMPLANT
NEEDLE SPNL 22GX7 QUINCKE BK (NEEDLE) IMPLANT
NS IRRIG 1000ML POUR BTL (IV SOLUTION) ×2 IMPLANT
NS IRRIG 500ML POUR BTL (IV SOLUTION) IMPLANT
PACK CYSTO (CUSTOM PROCEDURE TRAY) ×4 IMPLANT
PACK CYSTOSCOPY (CUSTOM PROCEDURE TRAY) ×4 IMPLANT
SCRUB PCMX 4 OZ (MISCELLANEOUS) ×2 IMPLANT
SYR 20CC LL (SYRINGE) IMPLANT
SYR 30ML LL (SYRINGE) ×2 IMPLANT
SYR BULB IRRIGATION 50ML (SYRINGE) ×2 IMPLANT
SYRINGE IRR TOOMEY STRL 70CC (SYRINGE) ×4 IMPLANT
TUBING CONNECTING 10 (TUBING) ×3 IMPLANT
TUBING CONNECTING 10' (TUBING) ×1
WATER STERILE IRR 3000ML UROMA (IV SOLUTION) ×4 IMPLANT

## 2013-09-03 NOTE — Op Note (Signed)
Pre-operative diagnosis :   Left bladder wall bladder cancer, bladder diverticular formation, BPH  Postoperative diagnosis:  Same  Operation:  Cystogram with interpretation, cystourethroscopy, left retrograde pyelogram, attempted left double-J stent, transurethral resection of large left bladder wall bladder cancer (15 cm, left bladder wall 2 left trigone to bladder neck); TURP  Surgeon:  S. Gaynelle Arabian, MD  First assistant:  None  Anesthesia:  General LMA  Preparation:  After appropriate preanesthesia, the patient was brought the operative room, placed in the upper table in the dorsal supine position where general LMA anesthesia was introduced. He was then replaced in the dorsal lithotomy position with pubis was prepped with Betadine solution and draped in usual fashion. The history was reviewed. The armband was double checked.  Review history:  Problems  1. Benign localized hyperplasia of prostate with urinary obstruction ([id number].Kenneth,213.08)   Assessed By: Carolan Clines (Urology); Last Assessed: 26 Jul 2013  2. Gross hematuria (599.71)   Assessed By: Carolan Clines (Urology); Last Assessed: 26 Jul 2013  History of Present Illness  80 yo married [name] returns today for a 7 mo f/u, flowrate & PVR. Originally referred back by Dr. Damita Dunnings for further evaluation of frequency, nocturia, intermittent, weak flow, microscopic hematuria & dysuria. Hx of BPH, s/p TUNA in 2001. IPSS= 24. Treated with cipro with resolution of pelvic discomfort.  He has recently had episode of gross painless hematuria, associated with upper respiratory infection.  He is post cystoscopy showing bladder diverticulum, and tight bladder neck.    Statement of  Likelihood of Success: Excellent. TIME-OUT observed.:  Procedure:  Cystogram was accomplished showing elevated bladder neck, and a left bladder wall bladder diverticulum. The bladder is drained of fluid, and cystoscopy showed large left bladder wall cancer,  encompassing the left bladder wall, measuring 15 cm, down on to the left trigone, L. to the bladder neck. The prostate was enlarged with large lateral lobe, and elevated bladder neck. There was trabeculation, and cellule formation within the bladder. There was no evidence of bladder stone. Particular attention was placed to the finding the left ureteral orifice, which is located on the left hemitrigone, at the lower and to the bladder tumor. Left retrograde PolyGram is performed, which showed no hydronephrosis, but a tortuous left ureter, with definite J. hooking of the lower ureter. I attempted to cannulate the left ureter number with both a 0.038 guidewire, as well as a floppy tip guidewire, but was unable to pass the wire above the lower ureter, although I was able to manipulate the wire around the J. hooking portion of the ureter.   Attention was then directed toward the large left bladder wall bladder tumor, or resection was accomplished, with particular attention to electrical stimulation of the pelvic nerves. Following resection of the tumor, I was no longer able to identify the left ureteral orifice.  This tissue was sent separately to the laboratory. Following this, resection of the prostate was accomplished, it was accomplished from the 7:00 to the 5:00 positions, from the 11:00 to the 7:00 position, and from the 1:00 to the 5:00 positions. Careful cauterization of the tissue was accomplished. A size 24 three-way hematuria catheter was placed in the bladder, to traction, and continuous irrigation.  The patient was awakened, taken to recovery room in good condition.

## 2013-09-03 NOTE — Anesthesia Preprocedure Evaluation (Signed)
Anesthesia Evaluation  Patient identified by MRN, date of birth, ID band Patient awake    Reviewed: Allergy & Precautions, H&P , NPO status , Patient's Chart, lab work & pertinent test results  History of Anesthesia Complications (+) PONV  Airway Mallampati: II TM Distance: >3 FB Neck ROM: Full    Dental no notable dental hx.    Pulmonary former smoker,  breath sounds clear to auscultation  Pulmonary exam normal       Cardiovascular hypertension, Pt. on medications + CAD and + CABG Rhythm:Regular Rate:Normal     Neuro/Psych negative neurological ROS  negative psych ROS   GI/Hepatic Neg liver ROS, hiatal hernia, GERD-  Medicated and Controlled,  Endo/Other  negative endocrine ROS  Renal/GU negative Renal ROS  negative genitourinary   Musculoskeletal negative musculoskeletal ROS (+)   Abdominal   Peds negative pediatric ROS (+)  Hematology negative hematology ROS (+)   Anesthesia Other Findings   Reproductive/Obstetrics negative OB ROS                           Anesthesia Physical Anesthesia Plan  ASA: III  Anesthesia Plan: General   Post-op Pain Management:    Induction: Intravenous  Airway Management Planned: LMA  Additional Equipment:   Intra-op Plan:   Post-operative Plan: Extubation in OR  Informed Consent: I have reviewed the patients History and Physical, chart, labs and discussed the procedure including the risks, benefits and alternatives for the proposed anesthesia with the patient or authorized representative who has indicated his/her understanding and acceptance.   Dental advisory given  Plan Discussed with: CRNA  Anesthesia Plan Comments:         Anesthesia Quick Evaluation

## 2013-09-03 NOTE — Transfer of Care (Signed)
Immediate Anesthesia Transfer of Care Note  Patient: [name].  Procedure(s) Performed: Procedure(s) (LRB): CYSTOGRAM, left retrograde, attempted double J stent (Left) TRANSURETHRAL RESECTION OF THE PROSTATE WITH GYRUS INSTRUMENTS (N/A) TRANSURETHRAL RESECTION OF BLADDER TUMOR (TURBT) (N/A)  Patient Location: PACU  Anesthesia Type: General  Level of Consciousness: sedated, patient cooperative and responds to stimulation  Airway & Oxygen Therapy: Patient Spontanous Breathing and Patient connected to face mask oxgen  Post-op Assessment: Report given to PACU RN and Post -op Vital signs reviewed and stable  Post vital signs: Reviewed and stable  Complications: No apparent anesthesia complications

## 2013-09-03 NOTE — Interval H&P Note (Signed)
History and Physical Interval Note:  09/03/2013 8:27 AM  [name].  has presented today for surgery, with the diagnosis of PBH  The various methods of treatment have been discussed with the patient and family. After consideration of risks, benefits and other options for treatment, the patient has consented to  Procedure(s): CYSTOGRAM (N/A) TRANSURETHRAL RESECTION OF THE PROSTATE WITH GYRUS INSTRUMENTS (N/A) TRANSURETHRAL RESECTION OF BLADDER TUMOR (TURBT) (N/A) as a surgical intervention .  The patient's history has been reviewed, patient examined, no change in status, stable for surgery.  I have reviewed the patient's chart and labs.  Questions were answered to the patient's satisfaction.     Ailene Rud

## 2013-09-03 NOTE — Anesthesia Postprocedure Evaluation (Signed)
  Anesthesia Post-op Note  Patient: [name].  Procedure(s) Performed: Procedure(s) (LRB): CYSTOGRAM, left retrograde, attempted double J stent (Left) TRANSURETHRAL RESECTION OF THE PROSTATE WITH GYRUS INSTRUMENTS (N/A) TRANSURETHRAL RESECTION OF BLADDER TUMOR (TURBT) (N/A)  Patient Location: PACU  Anesthesia Type: General  Level of Consciousness: awake and alert   Airway and Oxygen Therapy: Patient Spontanous Breathing  Post-op Pain: mild  Post-op Assessment: Post-op Vital signs reviewed, Patient's Cardiovascular Status Stable, Respiratory Function Stable, Patent Airway and No signs of Nausea or vomiting  Last Vitals:  Filed Vitals:   09/03/13 1337  BP: 166/91  Pulse: 82  Temp:   Resp: 13    Post-op Vital Signs: stable   Complications: No apparent anesthesia complications

## 2013-09-03 NOTE — H&P (Signed)
Reason For Visit 7 mo f/u, flowrate & PVR   Active Problems Problems  1. Benign localized hyperplasia of prostate with urinary obstruction (235.57,322.02)   Assessed By: Carolan Clines (Urology); Last Assessed: 26 Jul 2013 2. Gross hematuria (599.71)   Assessed By: Carolan Clines (Urology); Last Assessed: 26 Jul 2013  History of Present Illness     80 yo married male returns today for a 7 mo f/u, flowrate & PVR. Originally referred back by Dr. Damita Dunnings for further evaluation of frequency, nocturia, intermittent, weak flow, microscopic hematuria & dysuria. Hx of BPH, s/p TUNA in 2001. IPSS= 24. Treated with cipro with resolution of pelvic discomfort.    He has recently had episode of gross painless hematuria, associated with upper respiratory infection.   He is post cystoscopy showing bladder diverticulum, and tight bladder neck.     10/23/12 urine culture was negative.   Past Medical History Problems  1. History of Arthritis (V13.4) 2. History of Gout (274.9) 3. History of cardiac disorder (V12.50) 4. History of esophageal reflux (V12.79) 5. History of hypercholesterolemia (V12.29) 6. History of hypertension (V12.59)  Surgical History Problems  1. History of Back Surgery 2. History of CABG (CABG) 3. History of Complete Colonoscopy 4. History of Surg Prostate Transureth Dest Tissue Microwave Thermotherapy 5. History of Upper Gastrointestinal Endoscopy, Simple Primary Exam  Current Meds 1. AmLODIPine Besylate 5 MG Oral Tablet;  Therapy: 30Jun2014 to Recorded 2. Aspirin 81 MG Oral Tablet;  Therapy: (Recorded:19Aug2014) to Recorded 3. Crestor TABS;  Therapy: (Recorded:23Apr2015) to Recorded 4. Lisinopril 20 MG Oral Tablet;  Therapy: [phone] to Recorded 5. Meclizine HCl - 25 MG Oral Tablet;  Therapy: (Recorded:19Aug2014) to Recorded 6. Omeprazole 40 MG Oral Capsule Delayed Release;  Therapy: [id number] to Recorded 7. Tamsulosin HCl - 0.4 MG Oral Capsule; TAKE  1 CAPSULE Bedtime;  Therapy: [phone] to (Evaluate:18Sep2015)  Requested for: 23Sep2014; Last  Rx:23Sep2014 Ordered 8. Vitamin D3 1000 UNIT Oral Capsule;  Therapy: (Recorded:19Aug2014) to Recorded 9. Zetia TABS;  Therapy: (Recorded:23Apr2015) to Recorded  Allergies Medication  1. No Known Drug Allergies  Family History Problems  1. Family history of Family Health Status Number Of Children   2 sons 2. Family history of Father Deceased At Age ____ 3. Family history of Mother Deceased At Age ____ 55. Family history of Nephrolithiasis : Father  Social History Problems  1. Alcohol Use   1-2 per day 2. Caffeine Use   3 in AM 3. Former smoker (V15.82)   2-3 ppd  x 50yrs, quit 35 yrs ago 4. Marital History - Currently Married 5. Occupation: Retired  Electronics engineer, constitutional, skin, eye, otolaryngeal, hematologic/lymphatic, cardiovascular, pulmonary, endocrine, musculoskeletal, gastrointestinal, neurological and psychiatric system(s) were reviewed and pertinent findings if present are noted.  Genitourinary: urinary frequency, dysuria, nocturia, difficulty starting the urinary stream, weak urinary stream, urinary stream starts and stops and hematuria.  Gastrointestinal: nausea, vomiting and diarrhea.  Constitutional: feeling tired (fatigue).  Hematologic/Lymphatic: a tendency to easily bruise.  Cardiovascular: chest pain.  Musculoskeletal: back pain and joint pain.  Neurological: dizziness.  Psychiatric: anxiety.    Vitals Vital Signs [Data Includes: Last 1 Day]  Recorded: 23Apr2015 02:08PM  Blood Pressure: 167 / 82 Temperature: 97.1 F Heart Rate: 71  Physical Exam Constitutional: Well nourished and well developed . No acute distress.  ENT:. The ears and nose are normal in appearance.  Neck: The appearance of the neck is normal and no neck mass is present.  Pulmonary: No respiratory distress and normal  respiratory rhythm and effort.   Cardiovascular: Heart rate and rhythm are normal . No peripheral edema.  Abdomen: The abdomen is soft and nontender. No masses are palpated. No CVA tenderness. No hernias are palpable. No hepatosplenomegaly noted.  Rectal: Rectal exam demonstrates normal sphincter tone, no tenderness and no masses. Estimated prostate size is 3+. Normal rectal tone, no rectal masses, prostate is smooth, symmetric and non-tender. The prostate has no nodularity and is not tender. The left seminal vesicle is nonpalpable. The right seminal vesicle is nonpalpable. The perineum is normal on inspection.  Genitourinary: Examination of the penis demonstrates no discharge, no masses, no lesions and a normal meatus. The scrotum is without lesions. The right epididymis is palpably normal and non-tender. The left epididymis is palpably normal and non-tender. The right testis is non-tender and without masses. The left testis is non-tender and without masses.  Lymphatics: The femoral and inguinal nodes are not enlarged or tender.  Skin: Normal skin turgor, no visible rash and no visible skin lesions.  Neuro/Psych:. Mood and affect are appropriate.    Results/Data  Flow Rate: Voided 75 ml. A peak flow rate of 39ml/s and mean flow rate of 35ml/s.  PVR: Ultrasound PVR 259 ml.    Assessment Assessed  1. Diverticulum, bladder acquired (596.3) 2. Benign localized hyperplasia of prostate with urinary obstruction (600.21,599.69) 3. Gross hematuria (599.71)  80 yo male with hx of TUNA in 2001 for BPH, now with IPSS=17, wit significant urinary frequency, and incomplete emptying, and peak flow rate of only 2cc/sec ( normal > 25cc/sec); and pvr=259cc ( N<50cc). Most probably, pt has outlet obstruction with urinary residual in bladder tic.    We have discussed options, including TURP with "light anesthesia". Alternatively, he could have robotic bladder diverticulectomy, but would require deeper anesthesia, which we would like to avoid.    We will plan to perform at [facility], with 23 hr observation. Concern is for post op voiding ability. He will need cystogram to delineate his bladder diverticulum.   Plan Gross hematuria  1. AU CT-HEMATURIA PROTOCOL; Status:Hold For - Appointment,PreCert,Date of  Service,Print; Requested for:23Apr2015;   Cystogram to delineate the bladder diverticulum, [facility] TURP.   CT hematuria protocol.   Discussion/Summary cc: [name], MD     Signatures Electronically signed by : Carolan Clines, M.D.; Jul 26 2013  2:31PM EST

## 2013-09-04 ENCOUNTER — Encounter (HOSPITAL_COMMUNITY): Payer: Self-pay | Admitting: Urology

## 2013-09-04 MED ORDER — PROMETHAZINE HCL 25 MG/ML IJ SOLN
12.5000 mg | Freq: Four times a day (QID) | INTRAMUSCULAR | Status: DC | PRN
  Administered 2013-09-04: 12.5 mg via INTRAVENOUS
  Filled 2013-09-04: qty 1

## 2013-09-04 MED ORDER — CALCIUM CARBONATE ANTACID 500 MG PO CHEW: 2.0000 | CHEWABLE_TABLET | Freq: Two times a day (BID) | ORAL | Status: DC

## 2013-09-04 MED ORDER — CALCIUM CARBONATE ANTACID 500 MG PO CHEW
2.0000 | CHEWABLE_TABLET | Freq: Two times a day (BID) | ORAL | Status: DC
  Administered 2013-09-04: 400 mg via ORAL
  Filled 2013-09-04 (×3): qty 2
  Filled 2013-09-04: qty 1

## 2013-09-04 MED ORDER — PHENAZOPYRIDINE HCL 200 MG PO TABS: 200.0000 mg | ORAL_TABLET | Freq: Three times a day (TID) | ORAL | Status: DC | PRN

## 2013-09-04 MED ORDER — TRIMETHOPRIM 100 MG PO TABS: 100.0000 mg | ORAL_TABLET | ORAL | Status: DC

## 2013-09-04 MED ORDER — ALUM & MAG HYDROXIDE-SIMETH 200-200-20 MG/5ML PO SUSP
30.0000 mL | Freq: Four times a day (QID) | ORAL | Status: DC | PRN
  Administered 2013-09-04: 30 mL via ORAL
  Filled 2013-09-04: qty 30

## 2013-09-04 MED ORDER — TRAMADOL-ACETAMINOPHEN 37.5-325 MG PO TABS: 1.0000 | ORAL_TABLET | Freq: Four times a day (QID) | ORAL | Status: DC | PRN

## 2013-09-04 NOTE — Progress Notes (Signed)
Urology Progress Note  1 Day Post-Op : 1. TUR-BT: Left lateral bladder wall to trigone, to L ureteral orifice;                                                                  2. TURP: BPH  Subjective: On BBI with few clots. Tolerating well. Urine slight pink-tinged this AM.     No acute urologic events overnight. Ambulation:   negative Flatus:    positive Bowel movement  negative  Pain: some relief  Objective:  Blood pressure 135/79, pulse 63, temperature 97.6 F (36.4 C), temperature source Oral, resp. rate 18, height 6\' 1"  (1.854 m), weight 81.647 kg (180 lb), SpO2 100.00%.  Physical Exam:  General:  No acute distress, awake Extremities: extremities normal, atraumatic, no cyanosis or edema Genitourinary:  Normal s-p exam. Foley: remains with traction and CBI.    I/O last 3 completed shifts: In: 56387 [I.V.:2140; FIEPP:29518] Out: 62450 [Urine:62450]  No results found for this basename: HGB, WBC, PLT,  in the last 72 hours  No results found for this basename: NA, K, CL, CO2, BUN, CREATININE, CALCIUM, MAGNESIUM, GFRNONAA, GFRAA,  in the last 72 hours   No results found for this basename: PT, INR, APTT,  in the last 72 hours   No components found with this basename: ABG,   Assessment/Plan: Assesment: Bladder cancer and BPH. Pt doing well. Discussed operative findings with pt and again with family.  Pt is to increase activities as tolerated. D/c CBI and D/c traction Shower IV to hep. Lock Anticipate d/c in AM.

## 2013-09-05 ENCOUNTER — Encounter: Payer: Self-pay | Admitting: *Deleted

## 2013-09-05 MED ORDER — TRAMADOL-ACETAMINOPHEN 37.5-325 MG PO TABS: 1.0000 | ORAL_TABLET | Freq: Four times a day (QID) | ORAL | Status: DC | PRN

## 2013-09-05 MED ORDER — ONDANSETRON HCL 8 MG PO TABS: 8.0000 mg | ORAL_TABLET | Freq: Three times a day (TID) | ORAL | Status: DC | PRN

## 2013-09-05 MED ORDER — DOXYCYCLINE HYCLATE 50 MG PO CAPS: 100.0000 mg | ORAL_CAPSULE | Freq: Two times a day (BID) | ORAL | Status: DC

## 2013-09-05 NOTE — Discharge Instructions (Signed)
Post transurethral resection of the prostate (TURP) instructions  Your recent prostate surgery requires very special post hospital care. Despite the fact that no skin incisions were used the area around the prostate incision is quite raw and is covered with a scab to promote healing and prevent bleeding. Certain cautions are needed to assure that the scab is not disturbed of the next 2-3 weeks while the healing proceeds.  Because the raw surface in your prostate and the irritating effects of urine you may expect frequency of urination and/or urgency (a stronger desire to urinate) and perhaps even getting up at night more often. This will usually resolve or improve slowly over the healing period. You may see some blood in your urine over the first 6 weeks. Do not be alarmed, even if the urine was clear for a while. Get off your feet and drink lots of fluids until clearing occurs. If you start to pass clots or don't improve call us.  Diet:  You may return to your normal diet immediately. Because of the raw surface of your bladder, alcohol, spicy foods, foods high in acid and drinks with caffeine may cause irritation or frequency and should be used in moderation. To keep your urine flowing freely and avoid constipation, drink plenty of fluids during the day (8-10 glasses). Tip: Avoid cranberry juice because it is very acidic.  Activity:  Your physical activity doesn't need to be restricted. However, if you are very active, you may see some blood in the urine. We suggest that you reduce your activity under the circumstances until the bleeding has stopped.  Bowels:  It is important to keep your bowels regular during the postoperative period. Straining with bowel movements can cause bleeding. A bowel movement every other day is reasonable. Use a mild laxative if needed, such as milk of magnesia 2-3 tablespoons, or 2 Dulcolax tablets. Call if you continue to have problems. If you had been taking narcotics  for pain, before, during or after your surgery, you may be constipated. Take a laxative if necessary.  Medication:  You should resume your pre-surgery medications unless told not to. In addition you may be given an antibiotic to prevent or treat infection. Antibiotics are not always necessary. All medication should be taken as prescribed until the bottles are finished unless you are having an unusual reaction to one of the drugs.     Problems you should report to [place]:  a. Fever greater than 101F. b. Heavy bleeding, or clots (see notes above about blood in urine). c. Inability to urinate. d. Drug reactions (hives, rash, nausea, vomiting, diarrhea). e. Severe burning or pain with urination that is not improving.  Benign Prostatic Hyperplasia An enlarged prostate (benign prostatic hyperplasia) is common in older men. You may experience the following:  Weak urine stream.  Dribbling.  Feeling like the bladder has not emptied completely.  Difficulty starting urination.  Getting up frequently at night to urinate.  Urinating more frequently during the day. HOME CARE INSTRUCTIONS  Monitor your prostatic hyperplasia for any changes. The following actions may help to alleviate any discomfort you are experiencing:  Give yourself time when you urinate.  Stay away from alcohol.  Avoid beverages containing caffeine, such as coffee, tea, and colas, because they can make the problem worse.  Avoid decongestants, antihistamines, and some prescription medicines that can make the problem worse.  Follow up with your health care provider for further treatment as recommended. SEEK MEDICAL CARE IF:  You are experiencing  progressive difficulty voiding.  Your urine stream is progressively getting narrower.  You are awaking from sleep with the urge to void more frequently.  You are constantly feeling the need to void.  You experience loss of urine, especially in small amounts. SEEK  IMMEDIATE MEDICAL CARE IF:   You develop increased pain with urination or are unable to urinate.  You develop severe abdominal pain, vomiting, a high fever, or fainting.  You develop back pain or blood in your urine. MAKE SURE YOU:   Understand these instructions.  Will watch your condition.  Will get help right away if you are not doing well or get worse. Document Released: 03/22/2005 Document Revised: 11/22/2012 Document Reviewed: 08/22/2012 [facility] Patient Information 2014 [place].

## 2013-09-05 NOTE — Telephone Encounter (Signed)
Lmom to call our office. Time to schedule carotid doppler (6 mth f/u).

## 2013-09-05 NOTE — Discharge Summary (Signed)
Physician Discharge Summary  Patient ID: [name]. MRN: [id number] DOB/AGE: Jun 02, 1931 80 y.o.  Admit date: 09/03/2013 Discharge date: 09/05/2013  Admission Diagnoses: 1. Bladder cancer;  2. BPH  Discharge Diagnoses:  Active Problems:   Urinary bladder cancer BPH  Discharged Condition: stable  Hospital Course:  TRRP and TUR-BT  Consults: nopne  Significant Diagnostic Studies: No results found.  Treatments: {surgery  Discharge Exam: Blood pressure 136/54, pulse 70, temperature 98.2 F (36.8 C), temperature source Oral, resp. rate 18, height 6\' 1"  (1.854 m), weight 81.647 kg (180 lb), SpO2 99.00%. Normal abdomen.  Foley in place.   Disposition: Final discharge disposition not confirmed  Discharge Instructions   Call MD for:  persistant nausea and vomiting    Complete by:  As directed      Continue foley catheter    Complete by:  As directed      Diet - low sodium heart healthy    Complete by:  As directed      Discharge patient    Complete by:  As directed      Discontinue IV    Complete by:  As directed      Increase activity slowly    Complete by:  As directed             Medication List    STOP taking these medications       aspirin EC 81 MG tablet      TAKE these medications       ADVIL 200 MG tablet  Generic drug:  ibuprofen  Take 200 mg by mouth every 6 (six) hours as needed for moderate pain.     cholecalciferol 1000 UNITS tablet  Commonly known as:  VITAMIN D  Take 1,000 Units by mouth daily.     doxycycline 50 MG capsule  Commonly known as:  VIBRAMYCIN  Take 2 capsules (100 mg total) by mouth 2 (two) times daily.     fexofenadine 180 MG tablet  Commonly known as:  ALLEGRA  Take 180 mg by mouth daily as needed for allergies.     Fish Oil 1000 MG Caps  Take 1,000 mg by mouth 2 (two) times a week.     FLONASE 50 MCG/ACT nasal spray  Generic drug:  fluticasone  Place 2 sprays into the nose daily as needed for allergies.     hydroxypropyl methylcellulose 2.5 % ophthalmic solution  Commonly known as:  ISOPTO TEARS  Place 1 drop into both eyes 3 (three) times daily as needed for dry eyes.     lisinopril 20 MG tablet  Commonly known as:  PRINIVIL,ZESTRIL  Take 20 mg by mouth every morning.     meclizine 25 MG tablet  Commonly known as:  ANTIVERT  Take 25 mg by mouth every 6 (six) hours as needed for dizziness.     omeprazole 40 MG capsule  Commonly known as:  PRILOSEC  Take 40 mg by mouth daily.     ondansetron 8 MG tablet  Commonly known as:  ZOFRAN  Take 1 tablet (8 mg total) by mouth every 8 (eight) hours as needed for nausea or vomiting (1/2 tablet).     phenazopyridine 200 MG tablet  Commonly known as:  PYRIDIUM  Take 1 tablet (200 mg total) by mouth 3 (three) times daily as needed for pain.     rosuvastatin 10 MG tablet  Commonly known as:  CRESTOR  Take 10 mg by mouth at bedtime.  traMADol-acetaminophen 37.5-325 MG per tablet  Commonly known as:  ULTRACET  Take 1 tablet by mouth every 6 (six) hours as needed.     traMADol-acetaminophen 37.5-325 MG per tablet  Commonly known as:  ULTRACET  Take 1 tablet by mouth every 6 (six) hours as needed.     trimethoprim 100 MG tablet  Commonly known as:  TRIMPEX  Take 1 tablet (100 mg total) by mouth 1 day or 1 dose.           Follow-up Information   Follow up with Ailene Rud, MD.   Specialty:  Urology   Contact information:   [place] [address] [phone]       Follow up with Carolan Clines I, MD. (per appointment)    Specialty:  Urology   Contact information:   [place] [address] [phone]       Call Carolan Clines I, MD. (for Foley catheter removal Monday Morning)    Specialty:  Urology   Contact information:   [place] [address] [phone]       Signed: Ailene Rud 09/05/2013, 6:50 AM

## 2013-09-05 NOTE — Care Management Note (Signed)
    Page 1 of 1   09/05/2013     8:39:02 AM CARE MANAGEMENT NOTE 09/05/2013  Patient:  [name], [name]   Account Number:  [id number]  Date Initiated:  09/04/2013  Documentation initiated by:  Gabriel Earing  Subjective/Objective Assessment:   pt admitted for surgery     Action/Plan:   from home   Anticipated DC Date:  09/06/2013   Anticipated DC Plan:  HOME/SELF CARE         Choice offered to / List presented to:             Status of service:  Completed, signed off Medicare Important Message given?  NA - LOS <3 / Initial given by admissions (If response is "NO", the following Medicare IM given date fields will be blank) Date Medicare IM given:   Date Additional Medicare IM given:    Discharge Disposition:  HOME/SELF CARE  Per UR Regulation:  Reviewed for med. necessity/level of care/duration of stay  If discussed at [place] of Stay Meetings, dates discussed:    Comments:  09/04/13 MMCGIBBONEY, RN, BSN chart reviewed.

## 2013-09-05 NOTE — Progress Notes (Signed)
Urology Progress Note  2 Days Post-Op  Urine wit few clots. Pt wants to leave foley in because of prior hcof recurrent urinary retention agter foley removal.  Subjective:     No acute urologic events overnight. Ambulation:   positive Flatus:    positive Bowel movement  positive  Pain: complete resolution  Objective:  Blood pressure 136/54, pulse 70, temperature 98.2 F (36.8 C), temperature source Oral, resp. rate 18, height 6\' 1"  (1.854 m), weight 81.647 kg (180 lb), SpO2 99.00%.  Physical Exam:  General:  No acute distress, awake Extremities: extremities normal, atraumatic, no cyanosis or edema Genitourinary:   s-p exam normal.  Foley:  In place. Blood-tinged. ++ clots.     I/O last 3 completed shifts: In: 15400 [P.O.:360; I.V.:2140; JJKKX:38182] Out: 99371 [Urine:66050]  No results found for this basename: HGB, WBC, PLT,  in the last 72 hours  No results found for this basename: NA, K, CL, CO2, BUN, CREATININE, CALCIUM, MAGNESIUM, GFRNONAA, GFRAA,  in the last 72 hours   No results found for this basename: PT, INR, APTT,  in the last 72 hours   No components found with this basename: ABG,   Assessment/Plan:  Catheter not removed. Wound care discussed. He wil RTC Monday for catheter removal.  Hand irritate this AM.

## 2013-09-10 ENCOUNTER — Encounter (HOSPITAL_COMMUNITY): Payer: Self-pay | Admitting: Emergency Medicine

## 2013-09-10 ENCOUNTER — Inpatient Hospital Stay (HOSPITAL_COMMUNITY)
Admission: EM | Admit: 2013-09-10 | Discharge: 2013-09-16 | DRG: 812 | Disposition: A | Discharge status: Home / Self Care | Payer: Medicare HMO | Attending: Internal Medicine | Admitting: Internal Medicine

## 2013-09-10 DIAGNOSIS — M129 Arthropathy, unspecified: Secondary | ICD-10-CM

## 2013-09-10 DIAGNOSIS — I358 Other nonrheumatic aortic valve disorders: Secondary | ICD-10-CM

## 2013-09-10 DIAGNOSIS — K4020 Bilateral inguinal hernia, without obstruction or gangrene, not specified as recurrent: Secondary | ICD-10-CM

## 2013-09-10 DIAGNOSIS — I739 Peripheral vascular disease, unspecified: Secondary | ICD-10-CM | POA: Diagnosis present

## 2013-09-10 DIAGNOSIS — N179 Acute kidney failure, unspecified: Secondary | ICD-10-CM | POA: Diagnosis present

## 2013-09-10 DIAGNOSIS — N183 Chronic kidney disease, stage 3 unspecified: Secondary | ICD-10-CM | POA: Diagnosis present

## 2013-09-10 DIAGNOSIS — K8020 Calculus of gallbladder without cholecystitis without obstruction: Secondary | ICD-10-CM

## 2013-09-10 DIAGNOSIS — Z9079 Acquired absence of other genital organ(s): Secondary | ICD-10-CM

## 2013-09-10 DIAGNOSIS — R011 Cardiac murmur, unspecified: Secondary | ICD-10-CM

## 2013-09-10 DIAGNOSIS — J309 Allergic rhinitis, unspecified: Secondary | ICD-10-CM

## 2013-09-10 DIAGNOSIS — I10 Essential (primary) hypertension: Secondary | ICD-10-CM

## 2013-09-10 DIAGNOSIS — N138 Other obstructive and reflux uropathy: Secondary | ICD-10-CM | POA: Diagnosis present

## 2013-09-10 DIAGNOSIS — N1330 Unspecified hydronephrosis: Secondary | ICD-10-CM | POA: Diagnosis present

## 2013-09-10 DIAGNOSIS — N390 Urinary tract infection, site not specified: Secondary | ICD-10-CM | POA: Diagnosis present

## 2013-09-10 DIAGNOSIS — R42 Dizziness and giddiness: Secondary | ICD-10-CM

## 2013-09-10 DIAGNOSIS — I129 Hypertensive chronic kidney disease with stage 1 through stage 4 chronic kidney disease, or unspecified chronic kidney disease: Secondary | ICD-10-CM | POA: Diagnosis present

## 2013-09-10 DIAGNOSIS — I25810 Atherosclerosis of coronary artery bypass graft(s) without angina pectoris: Secondary | ICD-10-CM

## 2013-09-10 DIAGNOSIS — R55 Syncope and collapse: Secondary | ICD-10-CM | POA: Diagnosis present

## 2013-09-10 DIAGNOSIS — Z87891 Personal history of nicotine dependence: Secondary | ICD-10-CM

## 2013-09-10 DIAGNOSIS — R7309 Other abnormal glucose: Secondary | ICD-10-CM

## 2013-09-10 DIAGNOSIS — I6529 Occlusion and stenosis of unspecified carotid artery: Secondary | ICD-10-CM

## 2013-09-10 DIAGNOSIS — F411 Generalized anxiety disorder: Secondary | ICD-10-CM

## 2013-09-10 DIAGNOSIS — E871 Hypo-osmolality and hyponatremia: Secondary | ICD-10-CM | POA: Diagnosis present

## 2013-09-10 DIAGNOSIS — L989 Disorder of the skin and subcutaneous tissue, unspecified: Secondary | ICD-10-CM

## 2013-09-10 DIAGNOSIS — E785 Hyperlipidemia, unspecified: Secondary | ICD-10-CM | POA: Diagnosis present

## 2013-09-10 DIAGNOSIS — E7800 Pure hypercholesterolemia, unspecified: Secondary | ICD-10-CM

## 2013-09-10 DIAGNOSIS — R339 Retention of urine, unspecified: Secondary | ICD-10-CM | POA: Diagnosis present

## 2013-09-10 DIAGNOSIS — IMO0002 Reserved for concepts with insufficient information to code with codable children: Secondary | ICD-10-CM

## 2013-09-10 DIAGNOSIS — Z9889 Other specified postprocedural states: Secondary | ICD-10-CM

## 2013-09-10 DIAGNOSIS — R7989 Other specified abnormal findings of blood chemistry: Secondary | ICD-10-CM | POA: Diagnosis present

## 2013-09-10 DIAGNOSIS — N259 Disorder resulting from impaired renal tubular function, unspecified: Secondary | ICD-10-CM

## 2013-09-10 DIAGNOSIS — N401 Enlarged prostate with lower urinary tract symptoms: Secondary | ICD-10-CM | POA: Diagnosis present

## 2013-09-10 DIAGNOSIS — I959 Hypotension, unspecified: Secondary | ICD-10-CM | POA: Diagnosis present

## 2013-09-10 DIAGNOSIS — Z860100 Personal history of colon polyps, unspecified: Secondary | ICD-10-CM

## 2013-09-10 DIAGNOSIS — R319 Hematuria, unspecified: Secondary | ICD-10-CM

## 2013-09-10 DIAGNOSIS — R310 Gross hematuria: Secondary | ICD-10-CM | POA: Diagnosis present

## 2013-09-10 DIAGNOSIS — K4090 Unilateral inguinal hernia, without obstruction or gangrene, not specified as recurrent: Secondary | ICD-10-CM

## 2013-09-10 DIAGNOSIS — N400 Enlarged prostate without lower urinary tract symptoms: Secondary | ICD-10-CM

## 2013-09-10 DIAGNOSIS — C679 Malignant neoplasm of bladder, unspecified: Secondary | ICD-10-CM | POA: Diagnosis present

## 2013-09-10 DIAGNOSIS — H9190 Unspecified hearing loss, unspecified ear: Secondary | ICD-10-CM | POA: Diagnosis present

## 2013-09-10 DIAGNOSIS — I2510 Atherosclerotic heart disease of native coronary artery without angina pectoris: Secondary | ICD-10-CM | POA: Diagnosis present

## 2013-09-10 DIAGNOSIS — E559 Vitamin D deficiency, unspecified: Secondary | ICD-10-CM

## 2013-09-10 DIAGNOSIS — Z951 Presence of aortocoronary bypass graft: Secondary | ICD-10-CM

## 2013-09-10 DIAGNOSIS — Z0000 Encounter for general adult medical examination without abnormal findings: Secondary | ICD-10-CM

## 2013-09-10 DIAGNOSIS — N189 Chronic kidney disease, unspecified: Secondary | ICD-10-CM

## 2013-09-10 DIAGNOSIS — K219 Gastro-esophageal reflux disease without esophagitis: Secondary | ICD-10-CM | POA: Diagnosis present

## 2013-09-10 DIAGNOSIS — Z85828 Personal history of other malignant neoplasm of skin: Secondary | ICD-10-CM

## 2013-09-10 DIAGNOSIS — N3289 Other specified disorders of bladder: Secondary | ICD-10-CM | POA: Diagnosis present

## 2013-09-10 DIAGNOSIS — D62 Acute posthemorrhagic anemia: Principal | ICD-10-CM | POA: Diagnosis present

## 2013-09-10 DIAGNOSIS — N135 Crossing vessel and stricture of ureter without hydronephrosis: Secondary | ICD-10-CM | POA: Diagnosis present

## 2013-09-10 LAB — URINALYSIS, ROUTINE W REFLEX MICROSCOPIC
Glucose, UA: 100 mg/dL — AB
Ketones, ur: 40 mg/dL — AB
Nitrite: POSITIVE — AB
Protein, ur: >300 mg/dL — AB
Specific Gravity, Urine: 1.025 (ref 1.005–1.030)
Urobilinogen, UA: 2 mg/dL — ABNORMAL HIGH (ref 0.0–1.0)
pH: 5 (ref 5.0–8.0)

## 2013-09-10 LAB — URINE MICROSCOPIC-ADD ON

## 2013-09-10 MED ORDER — DEXTROSE 5 % IV SOLN
1.0000 g | Freq: Once | INTRAVENOUS | Status: AC
  Administered 2013-09-11: 1 g via INTRAVENOUS
  Filled 2013-09-10: qty 10
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
## 2013-09-10 NOTE — ED Notes (Signed)
Bed: RESA Expected date:  Expected time:  Means of arrival:  Comments: Hold for rm 09

## 2013-09-10 NOTE — ED Notes (Signed)
Patient is alert and oriented x3.  He is being seen for urinary retention after having a TURP Procedure this morning.  He states that he has been able to urinate till 6pm today and  Then started having urgency and frequency with minimal urine production.   Currently he rates his pain 5 of 10.

## 2013-09-11 DIAGNOSIS — I359 Nonrheumatic aortic valve disorder, unspecified: Secondary | ICD-10-CM | POA: Diagnosis not present

## 2013-09-11 DIAGNOSIS — R310 Gross hematuria: Secondary | ICD-10-CM | POA: Diagnosis present

## 2013-09-11 DIAGNOSIS — N189 Chronic kidney disease, unspecified: Secondary | ICD-10-CM

## 2013-09-11 DIAGNOSIS — R339 Retention of urine, unspecified: Secondary | ICD-10-CM | POA: Diagnosis present

## 2013-09-11 DIAGNOSIS — N400 Enlarged prostate without lower urinary tract symptoms: Secondary | ICD-10-CM

## 2013-09-11 DIAGNOSIS — E871 Hypo-osmolality and hyponatremia: Secondary | ICD-10-CM | POA: Diagnosis present

## 2013-09-11 DIAGNOSIS — I959 Hypotension, unspecified: Secondary | ICD-10-CM

## 2013-09-11 DIAGNOSIS — N390 Urinary tract infection, site not specified: Secondary | ICD-10-CM | POA: Diagnosis present

## 2013-09-11 DIAGNOSIS — R319 Hematuria, unspecified: Secondary | ICD-10-CM

## 2013-09-11 DIAGNOSIS — R55 Syncope and collapse: Secondary | ICD-10-CM

## 2013-09-11 DIAGNOSIS — Z9889 Other specified postprocedural states: Secondary | ICD-10-CM

## 2013-09-11 DIAGNOSIS — N179 Acute kidney failure, unspecified: Secondary | ICD-10-CM | POA: Diagnosis present

## 2013-09-11 DIAGNOSIS — Z9079 Acquired absence of other genital organ(s): Secondary | ICD-10-CM

## 2013-09-11 LAB — CBC
HCT: 22.5 % — ABNORMAL LOW (ref 39.0–52.0)
HEMOGLOBIN: 7.7 g/dL — AB (ref 13.0–17.0)
MCH: 30 pg (ref 26.0–34.0)
MCHC: 34.2 g/dL (ref 30.0–36.0)
MCV: 87.5 fL (ref 78.0–100.0)
Platelets: 276 10*3/uL (ref 150–400)
RBC: 2.57 MIL/uL — ABNORMAL LOW (ref 4.22–5.81)
RDW: 13.3 % (ref 11.5–15.5)
WBC: 10.6 10*3/uL — ABNORMAL HIGH (ref 4.0–10.5)

## 2013-09-11 LAB — CBC WITH DIFFERENTIAL/PLATELET
Basophils Absolute: 0.1 10*3/uL (ref 0.0–0.1)
Basophils Relative: 0 % (ref 0–1)
Eosinophils Absolute: 0.3 10*3/uL (ref 0.0–0.7)
Eosinophils Relative: 2 % (ref 0–5)
HCT: 39.6 % (ref 39.0–52.0)
Hemoglobin: 13.4 g/dL (ref 13.0–17.0)
Lymphocytes Relative: 26 % (ref 12–46)
Lymphs Abs: 3.3 10*3/uL (ref 0.7–4.0)
MCH: 29.9 pg (ref 26.0–34.0)
MCHC: 33.8 g/dL (ref 30.0–36.0)
MCV: 88.4 fL (ref 78.0–100.0)
Monocytes Absolute: 1.1 10*3/uL — ABNORMAL HIGH (ref 0.1–1.0)
Monocytes Relative: 9 % (ref 3–12)
Neutro Abs: 8 10*3/uL — ABNORMAL HIGH (ref 1.7–7.7)
Neutrophils Relative %: 63 % (ref 43–77)
Platelets: 327 10*3/uL (ref 150–400)
RBC: 4.48 MIL/uL (ref 4.22–5.81)
RDW: 13 % (ref 11.5–15.5)
WBC: 12.7 10*3/uL — ABNORMAL HIGH (ref 4.0–10.5)

## 2013-09-11 LAB — BASIC METABOLIC PANEL
BUN: 25 mg/dL — ABNORMAL HIGH (ref 6–23)
BUN: 30 mg/dL — ABNORMAL HIGH (ref 6–23)
CO2: 20 mEq/L (ref 19–32)
CO2: 21 mEq/L (ref 19–32)
Calcium: 8 mg/dL — ABNORMAL LOW (ref 8.4–10.5)
Calcium: 8.4 mg/dL (ref 8.4–10.5)
Chloride: 95 mEq/L — ABNORMAL LOW (ref 96–112)
Chloride: 98 mEq/L (ref 96–112)
Creatinine, Ser: 1.47 mg/dL — ABNORMAL HIGH (ref 0.50–1.35)
Creatinine, Ser: 1.74 mg/dL — ABNORMAL HIGH (ref 0.50–1.35)
GFR calc Af Amer: 41 mL/min — ABNORMAL LOW (ref >90)
GFR calc non Af Amer: 35 mL/min — ABNORMAL LOW (ref >90)
GFR, EST AFRICAN AMERICAN: 50 mL/min — AB (ref >90)
GFR, EST NON AFRICAN AMERICAN: 43 mL/min — AB (ref >90)
Glucose, Bld: 120 mg/dL — ABNORMAL HIGH (ref 70–99)
Glucose, Bld: 121 mg/dL — ABNORMAL HIGH (ref 70–99)
POTASSIUM: 4.1 meq/L (ref 3.7–5.3)
Potassium: 3.9 mEq/L (ref 3.7–5.3)
SODIUM: 130 meq/L — AB (ref 137–147)
Sodium: 129 mEq/L — ABNORMAL LOW (ref 137–147)

## 2013-09-11 LAB — TROPONIN I
Troponin I: <0.3 ng/mL (ref <0.30)
Troponin I: <0.3 ng/mL (ref <0.30)
Troponin I: <0.3 ng/mL (ref <0.30)
Troponin I: <0.3 ng/mL (ref <0.30)

## 2013-09-11 LAB — HEMOGLOBIN AND HEMATOCRIT, BLOOD
HCT: 28.3 % — ABNORMAL LOW (ref 39.0–52.0)
Hemoglobin: 9.5 g/dL — ABNORMAL LOW (ref 13.0–17.0)

## 2013-09-11 LAB — MRSA PCR SCREENING: MRSA BY PCR: NEGATIVE

## 2013-09-11 LAB — LACTIC ACID, PLASMA: Lactic Acid, Venous: 0.9 mmol/L (ref 0.5–2.2)

## 2013-09-11 LAB — PREPARE RBC (CROSSMATCH)

## 2013-09-11 MED ORDER — SODIUM CHLORIDE 0.9 % IV SOLN
INTRAVENOUS | Status: AC
  Administered 2013-09-11: 03:00:00 via INTRAVENOUS

## 2013-09-11 MED ORDER — HYDROMORPHONE HCL PF 1 MG/ML IJ SOLN
1.0000 mg | INTRAMUSCULAR | Status: DC | PRN
  Filled 2013-09-11: qty 1

## 2013-09-11 MED ORDER — PHENAZOPYRIDINE HCL 200 MG PO TABS
200.0000 mg | ORAL_TABLET | Freq: Three times a day (TID) | ORAL | Status: DC | PRN
  Administered 2013-09-14 – 2013-09-15 (×2): 200 mg via ORAL
  Filled 2013-09-11 (×4): qty 1

## 2013-09-11 MED ORDER — DEXTROSE 5 % IV SOLN
1.0000 g | INTRAVENOUS | Status: DC
  Administered 2013-09-11 – 2013-09-15 (×5): 1 g via INTRAVENOUS
  Filled 2013-09-11 (×5): qty 10

## 2013-09-11 MED ORDER — TRAMADOL-ACETAMINOPHEN 37.5-325 MG PO TABS: 1.0000 | ORAL_TABLET | Freq: Four times a day (QID) | ORAL | Status: DC | PRN

## 2013-09-11 MED ORDER — PROMETHAZINE HCL 25 MG PO TABS
12.5000 mg | ORAL_TABLET | Freq: Four times a day (QID) | ORAL | Status: DC | PRN
  Administered 2013-09-11: 12.5 mg via ORAL
  Filled 2013-09-11: qty 1

## 2013-09-11 MED ORDER — VITAMIN D3 25 MCG (1000 UNIT) PO TABS
1000.0000 [IU] | ORAL_TABLET | Freq: Every day | ORAL | Status: DC
  Administered 2013-09-11 – 2013-09-16 (×6): 1000 [IU] via ORAL
  Filled 2013-09-11 (×7): qty 1

## 2013-09-11 MED ORDER — PANTOPRAZOLE SODIUM 40 MG PO TBEC
40.0000 mg | DELAYED_RELEASE_TABLET | Freq: Every day | ORAL | Status: DC
  Administered 2013-09-11 – 2013-09-16 (×6): 40 mg via ORAL
  Filled 2013-09-11 (×6): qty 1

## 2013-09-11 MED ORDER — SODIUM CHLORIDE 0.9 % IR SOLN
3000.0000 mL | Status: DC
  Administered 2013-09-11 – 2013-09-14 (×31): 3000 mL via INTRAVESICAL
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
## 2013-09-11 NOTE — Progress Notes (Signed)
UR completed. Patient changed to inpatient- requiring IVF and IV antibiotics.

## 2013-09-11 NOTE — ED Notes (Signed)
Dr. Shanon Brow at bedside.
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
## 2013-09-11 NOTE — Progress Notes (Signed)
Patient seen and examined, admitted by Dr. Shanon Brow this morning Briefly 80 year old male with recent TURP for possible bladder CA one week ago by Dr. Gaynelle Arabian, had Foley catheter removed in the urology office, subsequently presented with urinary retention and pain. Patient had gross hematuria after placing a Foley catheter, then became unresponsive, vasovagal and hypertensive.  BP 134/44  Pulse 68  Temp(Src) 97.8 F (36.6 C) (Oral)  Resp 18  Ht 6\' 1"  (1.854 m)  Wt 80.2 kg (176 lb 12.9 oz)  BMI 23.33 kg/m2  SpO2 99%  Agree with H&P, continue IV fluid hydration  Continue with IV Rocephin, IV fluids Still having gross hematuria, discussed in detail with Dr. Louis Meckel from urology service, recommended CBI, replaced, urology will follow today Creatinine function improving after IV fluids and Foley catheter Given patient is having continuous hematuria, will transfuse 2 units packed RBC for hemoglobin of 7.7 Keep in SDU today Plan discussed with patient's wife at the bedside  Ripudeep Krystal Eaton M.D. [facility] 09/11/2013, 10:04 AM  Pager: [phone]

## 2013-09-11 NOTE — Consult Note (Signed)
I have seen and examined the patient, agree with the above findings.  Currently receiving PRBC transfusion and feeling better.  Urine is light pink on slow CBI.  Wean CBI to off.  Would discharge patient with foley.  Will continue to follow.

## 2013-09-12 ENCOUNTER — Inpatient Hospital Stay (HOSPITAL_COMMUNITY): Payer: Medicare HMO

## 2013-09-12 DIAGNOSIS — J309 Allergic rhinitis, unspecified: Secondary | ICD-10-CM

## 2013-09-12 DIAGNOSIS — F411 Generalized anxiety disorder: Secondary | ICD-10-CM

## 2013-09-12 LAB — CBC
HCT: 29.2 % — ABNORMAL LOW (ref 39.0–52.0)
Hemoglobin: 9.8 g/dL — ABNORMAL LOW (ref 13.0–17.0)
MCH: 29.6 pg (ref 26.0–34.0)
MCHC: 33.6 g/dL (ref 30.0–36.0)
MCV: 88.2 fL (ref 78.0–100.0)
Platelets: 259 10*3/uL (ref 150–400)
RBC: 3.31 MIL/uL — AB (ref 4.22–5.81)
RDW: 14.4 % (ref 11.5–15.5)
WBC: 10.2 10*3/uL (ref 4.0–10.5)

## 2013-09-12 LAB — TYPE AND SCREEN
ABO/RH(D): AB POS
Antibody Screen: NEGATIVE
Unit division: 0
Unit division: 0

## 2013-09-12 LAB — BASIC METABOLIC PANEL
BUN: 20 mg/dL (ref 6–23)
CHLORIDE: 103 meq/L (ref 96–112)
CO2: 24 mEq/L (ref 19–32)
Calcium: 8.6 mg/dL (ref 8.4–10.5)
Creatinine, Ser: 1.65 mg/dL — ABNORMAL HIGH (ref 0.50–1.35)
GFR calc Af Amer: 44 mL/min — ABNORMAL LOW (ref >90)
GFR calc non Af Amer: 38 mL/min — ABNORMAL LOW (ref >90)
GLUCOSE: 107 mg/dL — AB (ref 70–99)
Potassium: 4.3 mEq/L (ref 3.7–5.3)
Sodium: 138 mEq/L (ref 137–147)

## 2013-09-12 LAB — ABO/RH: ABO/RH(D): AB POS

## 2013-09-12 NOTE — Plan of Care (Signed)
Problem: Phase I Progression Outcomes Goal: Voiding-avoid urinary catheter unless indicated Outcome: Not Applicable Date Met:  32/44/01 Urologist placed foley catheter.

## 2013-09-12 NOTE — Progress Notes (Signed)
Have irrigated patient twice since 1900, first with about 240 cc NS with return of many clots, CBI then opened wide and reduced to moderate as urine cleared. Irrigated patient again about 15 minutes later with 120 cc NS with return of a few large clots. CBI with NS wide open. Will continue to monitor patient.

## 2013-09-12 NOTE — Progress Notes (Signed)
Patient ID: Kenneth Solian., male   DOB: [birth year], 80 y.o.   MRN: [id number] [facility] PROGRESS NOTE  Kenneth Solian. TDH:[id number] DOB: Aug 11, 1931 DOA: 09/10/2013 PCP: [name], MD  Brief narrative: 80 y.o. male with PMH significant for HTN, carotid artery stenosis, PVD, BPH, CRI, and vertigo who presented to the ED with main concern of sudden onset of urinary retention. He explains he saw his urologist in the office earlier in the day prior to this admission and was able to void but he suddenly stopped voiding. He had foley cath placed in ED and had 400 cc return. Since that cath clotted off, pt has required placement of second foley cath and had sustained a syncopal event post foley placement with hypotension. He was rescucitated with one liter NS and has responded well, BP stabilized and [facility] called to admit pt for further evaluation and management.   Principal Problem:   Syncope - possibly related to repeated foley placement attempts imposed on acute blood loss anemia, note the drop in Hg from 13.4  --> 7.7  - pt is still on IVF and reports feeling better this AM - will transfer to telemetry bed as we see trigemini on tele - pt remains asymptomatic with no chest pain or shortness of breath, 12 lead on admission indicates NSR with LVH, four sets of troponins negative  - will repeat 12 lead in AM, discussed with cardiology on call, agree with close monitoring, no need for further intervention at this time  - repeat BMP in AM and also check TSH  Active Problems:   Urinary bladder cancer with persistent hematuria  - Muscle invasive bladder cancer, BPH, post TURP and TUR-BT. continuing to have gross hematuria. - management pre urology  - plan for renal US    Urinary retention - foley in place, management per urology    Acute blood loss anemia - Hg drop 13.4 --> 7.7 --> 9.8, remains stable over the past 24 hours  - required 2 U PRBC with appropriate increase in Hg post transfusion   - CBC in AM    UTI (lower urinary tract infection) - continue Rocephin day #2   CAD - monitor on telemetry    Hyponatremia - stable overall and WNL this AM   Renal failure, acute on chronic - Cr still elevated, close monitor by repeating BMP in AM  Consultants:  Urology  Procedures/Studies:  Bladder, transurethral resection, left lateral wall, base, left trigone  - HIGH GRADE PAPILLARY AND INVASIVE UROTHELIAL CARCINOMA.  - MUSCULARIS PROPRIA TISSUE PRESENT AND INVOLVED WITH TUMOR.  Antibiotics:  Rocephin 6/9 -->  Code Status: Full Family Communication: Pt and family at bedside Disposition Plan: Home when medically stable  HPI/Subjective: No events overnight.   Objective: Filed Vitals:   09/12/13 0505 09/12/13 0515 09/12/13 0520 09/12/13 0600  BP: 157/73 167/79 188/85 131/78  Pulse: 60 62 50 60  Temp:      TempSrc:      Resp: 15 18 22 18   Height:      Weight:      SpO2: 99% 99% 99% 100%    Intake/Output Summary (Last 24 hours) at 09/12/13 6468 Last data filed at 09/12/13 0600  Gross per 24 hour  Intake 20467.5 ml  Output  21900 ml  Net -1432.5 ml    Exam:   General:  Pt is alert, follows commands appropriately, not in acute distress  Cardiovascular: Regular rate and rhythm with ectopic beats, no murmurs,  no rubs, no gallops  Respiratory: Clear to auscultation bilaterally, no wheezing, no crackles, no rhonchi  Abdomen: Soft, non tender, non distended, bowel sounds present, no guarding  Extremities: No edema, pulses DP and PT palpable bilaterally  Neuro: Grossly nonfocal  Data Reviewed: Basic Metabolic Panel:  Recent Labs Lab 09/10/13 2330 09/11/13 0745 09/12/13 0515  NA 129* 130* 138  K 3.9 4.1 4.3  CL 95* 98 103  CO2 20 21 24   GLUCOSE 120* 121* 107*  BUN 30* 25* 20  CREATININE 1.74* 1.47* 1.65*  CALCIUM 8.4 8.0* 8.6   CBC:  Recent Labs Lab 09/10/13 2330 09/11/13 0745 09/11/13 2015 09/12/13 0515  WBC 12.7* 10.6*  --  10.2   NEUTROABS 8.0*  --   --   --   HGB 13.4 7.7* 9.5* 9.8*  HCT 39.6 22.5* 28.3* 29.2*  MCV 88.4 87.5  --  88.2  PLT 327 276  --  259   Cardiac Enzymes:  Recent Labs Lab 09/10/13 2330 09/11/13 0312 09/11/13 0745 09/11/13 1432  TROPONINI <0.30 <0.30 <0.30 <0.30   Recent Results (from the past 240 hour(s))  MRSA PCR SCREENING     Status: None   Collection Time    09/11/13  2:48 AM      Result Value Ref Range Status   MRSA by PCR NEGATIVE  NEGATIVE Final   Comment:            The GeneXpert MRSA Assay (FDA     approved for NASAL specimens     only), is one component of a     comprehensive MRSA colonization     surveillance program. It is not     intended to diagnose MRSA     infection nor to guide or     monitor treatment for     MRSA infections.     Scheduled Meds: . cefTRIAXone (ROCEPHIN)  IV  1 g Intravenous Q24H  . cholecalciferol  1,000 Units Oral Daily  . pantoprazole  40 mg Oral Daily   Continuous Infusions: . sodium chloride irrigation     Faye Ramsay, MD  [facility] Pager [phone]  If 7PM-7AM, please contact night-coverage [email or web address] Password [email or web address] 09/12/2013, 6:39 AM   LOS: 2 days

## 2013-09-12 NOTE — Progress Notes (Signed)
Hand off report called to [place], Therapist, sports.   Patient transported via wheelchair to room 1417.
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
## 2013-09-13 LAB — BASIC METABOLIC PANEL
BUN: 21 mg/dL (ref 6–23)
CO2: 24 meq/L (ref 19–32)
Calcium: 8.9 mg/dL (ref 8.4–10.5)
Chloride: 102 mEq/L (ref 96–112)
Creatinine, Ser: 1.62 mg/dL — ABNORMAL HIGH (ref 0.50–1.35)
GFR calc Af Amer: 45 mL/min — ABNORMAL LOW (ref >90)
GFR calc non Af Amer: 38 mL/min — ABNORMAL LOW (ref >90)
Glucose, Bld: 111 mg/dL — ABNORMAL HIGH (ref 70–99)
POTASSIUM: 4.1 meq/L (ref 3.7–5.3)
SODIUM: 138 meq/L (ref 137–147)

## 2013-09-13 LAB — CBC
HCT: 28 % — ABNORMAL LOW (ref 39.0–52.0)
HEMOGLOBIN: 9.4 g/dL — AB (ref 13.0–17.0)
MCH: 29.3 pg (ref 26.0–34.0)
MCHC: 33.6 g/dL (ref 30.0–36.0)
MCV: 87.2 fL (ref 78.0–100.0)
Platelets: 286 10*3/uL (ref 150–400)
RBC: 3.21 MIL/uL — AB (ref 4.22–5.81)
RDW: 14.3 % (ref 11.5–15.5)
WBC: 10.6 10*3/uL — ABNORMAL HIGH (ref 4.0–10.5)

## 2013-09-13 LAB — TSH: TSH: 6.35 u[IU]/mL — AB (ref 0.350–4.500)

## 2013-09-13 MED ORDER — SENNOSIDES-DOCUSATE SODIUM 8.6-50 MG PO TABS
1.0000 | ORAL_TABLET | Freq: Two times a day (BID) | ORAL | Status: DC
  Administered 2013-09-13 – 2013-09-16 (×7): 1 via ORAL
  Filled 2013-09-13 (×8): qty 1

## 2013-09-13 MED ORDER — POLYETHYLENE GLYCOL 3350 17 G PO PACK
17.0000 g | PACK | Freq: Every day | ORAL | Status: DC
  Administered 2013-09-13 – 2013-09-16 (×4): 17 g via ORAL
  Filled 2013-09-13 (×4): qty 1

## 2013-09-13 NOTE — Progress Notes (Signed)
Subjective: Bleeding now pink, with intermittant CBI. Clot irrigated last pm.     No acute urologic events overnight. Ambulation:   positive Flatus:    positive Bowel movement  positive  Pain: some relief  Objective:  Blood pressure 133/70, pulse 64, temperature 98.3 F (36.8 C), temperature source Oral, resp. rate 20, height 6\' 1"  (1.854 m), weight 80.2 kg (176 lb 12.9 oz), SpO2 98.00%.  Physical Exam:  General:  No acute distress, awake Extremities: extremities normal, atraumatic, no cyanosis or edema Genitourinary:  wnl Foley: urine pink today, with prn CBI.     I/O last 3 completed shifts: In: 85277 [P.O.:960; I.V.:150; Other:30800; IV Piggyback:100] Out: 37175 [Urine:37175]  Recent Labs     09/12/13  0515  09/13/13  0335  HGB  9.8*  9.4*  WBC  10.2  10.6*  PLT  259  286    Recent Labs     09/12/13  0515  09/13/13  0335  NA  138  138  K  4.3  4.1  CL  103  102  CO2  24  24  BUN  20  21  CREATININE  1.65*  1.62*  CALCIUM  8.6  8.9  GFRNONAA  38*  38*  GFRAA  44*  45*     No results found for this basename: PT, INR, APTT,  in the last 72 hours   No components found with this basename: ABG,   Assessment/Plan:  Vitals normal Laboratory tests normal Patient alert and stable Pain minimal and well-controlled Post treatment course discussed in detail Followup discussed in detail   P: Dr. Tresa Moore to see as opinion regarding LRRCystectomy/ileal loop today.       Possible d/c with foley catheter ? Friday.

## 2013-09-13 NOTE — Progress Notes (Signed)
Patient ambulated in hall from one end and back and tolerated well. Returned to room and sitting up in chair. No clots present at this time.  CBI speed was moderate but have slowed it down at this time.  Instructed patient to call me if he felt any pressure or discomfort.  Will continue to monitor.
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
## 2013-09-13 NOTE — Progress Notes (Signed)
Patient ID: [name]., male   DOB: 03-12-1932, 80 y.o.   MRN: [id number] [facility] PROGRESS NOTE  [name]. OEU:[id number] DOB: [birth year] DOA: 09/10/2013 PCP: [name], MD  Brief narrative: 80 y.o. male with PMH significant for HTN, carotid artery stenosis, PVD, BPH, CRI, and vertigo who presented to the ED with main concern of sudden onset of urinary retention. He explains he saw his urologist in the office earlier in the day prior to this admission and was able to void but he suddenly stopped voiding. He had foley cath placed in ED and had 400 cc return. Since that cath clotted off, pt has required placement of second foley cath and had sustained a syncopal event post foley placement with hypotension. He was rescucitated with one liter NS and has responded well, BP stabilized and [facility] called to admit pt for further evaluation and management.   Principal Problem:  Syncope  - possibly related to repeated foley placement attempts imposed on acute blood loss anemia, note the drop in Hg from 13.4 --> 7.7  - pt is still on IVF and reports feeling well this AM, has gotten some rest last night  - no events on telemetry over night  - pt remains asymptomatic with no chest pain or shortness of breath, 12 lead on admission indicates NSR with LVH, four sets of troponins negative  - discussed with cardiology on call, agree with close monitoring, no need for further intervention at this time  - 12 lead EKG this AM unchanged since admission  Active Problems:  Urinary bladder cancer with persistent hematuria  - Muscle invasive bladder cancer, BPH, post TURP and TUR-BT. continuing to have gross hematuria.  - management pre urology  Urinary retention  - foley in place, management per urology  Acute blood loss anemia  - Hg drop 13.4 --> 7.7 --> 9.8 --> 9.4, remains stable overall  - required 2 U PRBC 09/11/2013 with appropriate increase in Hg post transfusion  - CBC in AM  UTI (lower  urinary tract infection)  - continue Rocephin day #3 CAD  - monitor on telemetry  Hyponatremia  - stable overall and WNL this AM  Renal failure, acute on chronic  - Cr still elevated, continue to monitor by repeating BMP in AM   Consultants:  Urology  Procedures/Studies:   US Renal   09/12/2013  Large heterogeneous bladder mass. Mild to moderate left hydronephrosis likely due to ureteral obstruction at the level of the ureteral trigone. Two left renal cysts.  No other abnormalities.    Bladder, transurethral resection, left lateral wall, base, left trigone HIGH GRADE PAPILLARY AND INVASIVE UROTHELIAL CARCINOMA. MUSCULARIS PROPRIA TISSUE PRESENT AND INVOLVED WITH TUMOR.  Antibiotics:  Rocephin 6/9 -->  Code Status: Full  Family Communication: Pt and family at bedside  Disposition Plan: Home when medically stable  HPI/Subjective: No events overnight.   Objective: Filed Vitals:   09/12/13 1431 09/12/13 2101 09/13/13 0211 09/13/13 0520  BP: 119/43 126/73 132/72 133/70  Pulse: 77 75 63 64  Temp: 98.8 F (37.1 C) 97.9 F (36.6 C) 98 F (36.7 C) 98.3 F (36.8 C)  TempSrc: Oral Oral Oral Oral  Resp: 19 18 18 20   Height:      Weight:      SpO2: 99% 100% 100% 98%    Intake/Output Summary (Last 24 hours) at 09/13/13 0611 Last data filed at 09/13/13 0522  Gross per 24 hour  Intake  31370 ml  Output  33525 ml  Net  -2155 ml    Exam:   General:  Pt is alert, follows commands appropriately, not in acute distress  Cardiovascular: Regular rate and rhythm, SEM 6/6, radiating to bilateral carotids (pt says its chronic), no rubs, no gallops  Respiratory: Clear to auscultation bilaterally, no wheezing, no crackles, no rhonchi  Abdomen: Soft, non tender, non distended, bowel sounds present, no guarding  Extremities: No edema, pulses DP and PT palpable bilaterally  Neuro: Grossly nonfocal  Data Reviewed: Basic Metabolic Panel:  Recent Labs Lab 09/10/13 2330  09/11/13 0745 09/12/13 0515 09/13/13 0335  NA 129* 130* 138 138  K 3.9 4.1 4.3 4.1  CL 95* 98 103 102  CO2 20 21 24 24   GLUCOSE 120* 121* 107* 111*  BUN 30* 25* 20 21  CREATININE 1.74* 1.47* 1.65* 1.62*  CALCIUM 8.4 8.0* 8.6 8.9   CBC:  Recent Labs Lab 09/10/13 2330 09/11/13 0745 09/11/13 2015 09/12/13 0515 09/13/13 0335  WBC 12.7* 10.6*  --  10.2 10.6*  NEUTROABS 8.0*  --   --   --   --   HGB 13.4 7.7* 9.5* 9.8* 9.4*  HCT 39.6 22.5* 28.3* 29.2* 28.0*  MCV 88.4 87.5  --  88.2 87.2  PLT 327 276  --  259 286   Cardiac Enzymes:  Recent Labs Lab 09/10/13 2330 09/11/13 0312 09/11/13 0745 09/11/13 1432  TROPONINI <0.30 <0.30 <0.30 <0.30   Recent Results (from the past 240 hour(s))  MRSA PCR SCREENING     Status: None   Collection Time    09/11/13  2:48 AM      Result Value Ref Range Status   MRSA by PCR NEGATIVE  NEGATIVE Final   Comment:            The GeneXpert MRSA Assay (FDA     approved for NASAL specimens     only), is one component of a     comprehensive MRSA colonization     surveillance program. It is not     intended to diagnose MRSA     infection nor to guide or     monitor treatment for     MRSA infections.     Scheduled Meds: . cefTRIAXone (ROCEPHIN)  IV  1 g Intravenous Q24H  . cholecalciferol  1,000 Units Oral Daily  . pantoprazole  40 mg Oral Daily   Continuous Infusions: . sodium chloride irrigation       Faye Ramsay, MD  [facility] Pager [phone]  If 7PM-7AM, please contact night-coverage [email or web address] Password [email or web address] 09/13/2013, 6:11 AM   LOS: 3 days

## 2013-09-14 LAB — CBC
HCT: 28.9 % — ABNORMAL LOW (ref 39.0–52.0)
Hemoglobin: 9.4 g/dL — ABNORMAL LOW (ref 13.0–17.0)
MCH: 28.7 pg (ref 26.0–34.0)
MCHC: 32.5 g/dL (ref 30.0–36.0)
MCV: 88.1 fL (ref 78.0–100.0)
PLATELETS: 313 10*3/uL (ref 150–400)
RBC: 3.28 MIL/uL — AB (ref 4.22–5.81)
RDW: 14 % (ref 11.5–15.5)
WBC: 12.8 10*3/uL — ABNORMAL HIGH (ref 4.0–10.5)

## 2013-09-14 LAB — BASIC METABOLIC PANEL
BUN: 25 mg/dL — ABNORMAL HIGH (ref 6–23)
CALCIUM: 9 mg/dL (ref 8.4–10.5)
CO2: 25 mEq/L (ref 19–32)
Chloride: 102 mEq/L (ref 96–112)
Creatinine, Ser: 1.53 mg/dL — ABNORMAL HIGH (ref 0.50–1.35)
GFR calc Af Amer: 48 mL/min — ABNORMAL LOW (ref >90)
GFR, EST NON AFRICAN AMERICAN: 41 mL/min — AB (ref >90)
Glucose, Bld: 121 mg/dL — ABNORMAL HIGH (ref 70–99)
Potassium: 4.8 mEq/L (ref 3.7–5.3)
Sodium: 137 mEq/L (ref 137–147)

## 2013-09-14 MED ORDER — SODIUM CHLORIDE 0.9 % IR SOLN
3000.0000 mL | Status: AC
  Administered 2013-09-15 (×4): 3000 mL via INTRAVESICAL

## 2013-09-14 MED ORDER — SODIUM CHLORIDE 0.9 % IR SOLN: Status: DC

## 2013-09-14 MED ORDER — SODIUM CHLORIDE 0.9 % IR SOLN
Status: DC
  Administered 2013-09-14: 14:00:00 via INTRAVESICAL
  Filled 2013-09-14 (×2): qty 40

## 2013-09-14 NOTE — Progress Notes (Signed)
Patient complaining of pressure in bladder, irrigated with 150 cc NS with return of several large clots. Urine now pink and clear. Patient reattached to CBI running wide open. Will continue to monitor patient.

## 2013-09-14 NOTE — Progress Notes (Signed)
Urology Progress Note  : 1. Invasive TCC bladder base, post TUR-BT,  and post TURP ( bladder neck). Pt continues to pass clots, despite hand and CBI. He  Is clear in between clot passage.   Subjective:     No acute urologic events overnight. Ambulation:   positive Flatus:    positive Bowel movement  positive  Pain: some relief except with clot passage.   Objective:  Blood pressure 151/67, pulse 65, temperature 98.3 F (36.8 C), temperature source Oral, resp. rate 18, height 6\' 1"  (1.854 m), weight 80.2 kg (176 lb 12.9 oz), SpO2 100.00%.  Physical Exam:  General:  No acute distress, awake Extremities: extremities normal, atraumatic, no cyanosis or edema Genitourinary:   s-p soft.  Foley:  53F, 3-way.     I/O last 3 completed shifts: In: 13086 [P.O.:480; VHQIO:96295; IV Piggyback:100] Out: 59550 [Urine:59550]  Recent Labs     09/13/13  0335  09/14/13  0344  HGB  9.4*  9.4*  WBC  10.6*  12.8*  PLT  286  313    Recent Labs     09/13/13  0335  09/14/13  0344  NA  138  137  K  4.1  4.8  CL  102  102  CO2  24  25  BUN  21  25*  CREATININE  1.62*  1.53*  CALCIUM  8.9  9.0  GFRNONAA  38*  41*  GFRAA  45*  48*     No results found for this basename: PT, INR, APTT,  in the last 72 hours   No components found with this basename: ABG,   Assessment/Plan:  Continue any current medications.   Will investigate Alum irrigation via 3-way catheter.

## 2013-09-14 NOTE — Progress Notes (Signed)
Patient ID: Kenneth Solian., male   DOB: April 17, 1931, 80 y.o.   MRN: [id number]  [facility] PROGRESS NOTE  Kenneth Solian. GYK:[id number] DOB: 05/09/1931 DOA: 09/10/2013 PCP: [name], MD  Brief narrative:  80 y.o. male with PMH significant for HTN, carotid artery stenosis, PVD, BPH, CRI, and vertigo who presented to the ED with main concern of sudden onset of urinary retention. He explains he saw his urologist in the office earlier in the day prior to this admission and was able to void but he suddenly stopped voiding. He had foley cath placed in ED and had 400 cc return. Since that cath clotted off, pt has required placement of second foley cath and had sustained a syncopal event post foley placement with hypotension. He was rescucitated with one liter NS and has responded well, BP stabilized and [facility] called to admit pt for further evaluation and management.   Principal Problem:  Syncope  - possibly related to repeated foley placement attempts imposed on acute blood loss anemia, note the drop in Hg from 13.4 --> 7.7  - pt reports having rough night with recurrent blood clots in the urine  - no events on telemetry over night  - pt remains asymptomatic with no chest pain or shortness of breath, 12 lead on admission indicates NSR with LVH, four sets of troponins negative  - discussed with cardiology on call, agree with close monitoring, no need for further intervention at this time  - 12 lead EKG 6/11 unchanged since admission  Active Problems:  Urinary bladder cancer with persistent hematuria  - Muscle invasive bladder cancer, BPH, post TURP and TUR-BT. continuing to have gross hematuria.  - management pre urology  Urinary retention  - foley in place, management per urology  Acute blood loss anemia  - Hg drop 13.4 --> 7.7 --> 9.8 --> 9.4, remains stable over the past 48 hours  - required 2 U PRBC 09/11/2013 with appropriate increase in Hg post transfusion  - CBC in AM  UTI (lower  urinary tract infection)  - continue Rocephin day #4/7 CAD  - monitor on telemetry  Hyponatremia  - stable overall and WNL this AM  Renal failure, acute on chronic  - Cr still elevated, but continue to trend down  - repeat BMP in AM  Consultants:  Urology  Procedures/Studies:   US Renal 09/12/2013 Large heterogeneous bladder mass. Mild to moderate left hydronephrosis likely due to ureteral obstruction at the level of the ureteral trigone. Two left renal cysts. No other abnormalities.  Bladder, transurethral resection, left lateral wall, base, left trigone HIGH GRADE PAPILLARY AND INVASIVE UROTHELIAL CARCINOMA. MUSCULARIS PROPRIA TISSUE PRESENT AND INVOLVED WITH TUMOR.  Antibiotics:  Rocephin 6/9 -->  Code Status: Full  Family Communication: Pt and family at bedside  Disposition Plan: Home when medically stable   HPI/Subjective: No events overnight.   Objective: Filed Vitals:   09/13/13 1500 09/13/13 2143 09/14/13 0249 09/14/13 0454  BP: 145/65 141/65 145/68 151/67  Pulse: 59 63 65 65  Temp: 98.3 F (36.8 C) 98.2 F (36.8 C) 98.2 F (36.8 C) 98.3 F (36.8 C)  TempSrc: Oral Oral Oral Oral  Resp: 20 18 18 18   Height:      Weight:      SpO2: 100% 100% 100% 100%    Intake/Output Summary (Last 24 hours) at 09/14/13 1311 Last data filed at 09/14/13 1221  Gross per 24 hour  Intake  49250 ml  Output  45375 ml  Net   3875 ml    Exam:   General:  Pt is alert, follows commands appropriately, not in acute distress  Cardiovascular: Regular rate and rhythm, SEM 6/6, no rubs, no gallops  Respiratory: Clear to auscultation bilaterally, no wheezing, no crackles, no rhonchi  Abdomen: Soft, non tender, non distended, bowel sounds present, no guarding  Extremities: No edema, pulses DP and PT palpable bilaterally   Data Reviewed: Basic Metabolic Panel:  Recent Labs Lab 09/10/13 2330 09/11/13 0745 09/12/13 0515 09/13/13 0335 09/14/13 0344  NA 129* 130* 138 138 137   K 3.9 4.1 4.3 4.1 4.8  CL 95* 98 103 102 102  CO2 20 21 24 24 25   GLUCOSE 120* 121* 107* 111* 121*  BUN 30* 25* 20 21 25*  CREATININE 1.74* 1.47* 1.65* 1.62* 1.53*  CALCIUM 8.4 8.0* 8.6 8.9 9.0   CBC:  Recent Labs Lab 09/10/13 2330 09/11/13 0745 09/11/13 2015 09/12/13 0515 09/13/13 0335 09/14/13 0344  WBC 12.7* 10.6*  --  10.2 10.6* 12.8*  NEUTROABS 8.0*  --   --   --   --   --   HGB 13.4 7.7* 9.5* 9.8* 9.4* 9.4*  HCT 39.6 22.5* 28.3* 29.2* 28.0* 28.9*  MCV 88.4 87.5  --  88.2 87.2 88.1  PLT 327 276  --  259 286 313   Cardiac Enzymes:  Recent Labs Lab 09/10/13 2330 09/11/13 0312 09/11/13 0745 09/11/13 1432  TROPONINI <0.30 <0.30 <0.30 <0.30    Recent Results (from the past 240 hour(s))  MRSA PCR SCREENING     Status: None   Collection Time    09/11/13  2:48 AM      Result Value Ref Range Status   MRSA by PCR NEGATIVE  NEGATIVE Final   Comment:            The GeneXpert MRSA Assay (FDA     approved for NASAL specimens     only), is one component of a     comprehensive MRSA colonization     surveillance program. It is not     intended to diagnose MRSA     infection nor to guide or     monitor treatment for     MRSA infections.     Scheduled Meds: . cefTRIAXone (ROCEPHIN)  IV  1 g Intravenous Q24H  . cholecalciferol  1,000 Units Oral Daily  . pantoprazole  40 mg Oral Daily  . polyethylene glycol  17 g Oral Daily  . senna-docusate  1 tablet Oral BID   Continuous Infusions: . sodium chloride irrigation     Faye Ramsay, MD  [facility] Pager [phone]  If 7PM-7AM, please contact night-coverage [email or web address] Password [email or web address] 09/14/2013, 1:11 PM   LOS: 4 days

## 2013-09-14 NOTE — Progress Notes (Signed)
Paged Dr. Gaynelle Arabian regarding frequent hand irrigation. Will continue to monitor patient.

## 2013-09-14 NOTE — Progress Notes (Signed)
Patient feeling pressure in bladder again, hand irrigated with 480 cc NS with return of many clots. Urine now reddish pink and clear with CBI wide open. Patient starting to feel relief, will continue to monitor.

## 2013-09-15 LAB — BASIC METABOLIC PANEL
BUN: 20 mg/dL (ref 6–23)
CO2: 25 mEq/L (ref 19–32)
Calcium: 9.4 mg/dL (ref 8.4–10.5)
Chloride: 100 mEq/L (ref 96–112)
Creatinine, Ser: 1.49 mg/dL — ABNORMAL HIGH (ref 0.50–1.35)
GFR, EST AFRICAN AMERICAN: 49 mL/min — AB (ref >90)
GFR, EST NON AFRICAN AMERICAN: 43 mL/min — AB (ref >90)
Glucose, Bld: 113 mg/dL — ABNORMAL HIGH (ref 70–99)
POTASSIUM: 4.4 meq/L (ref 3.7–5.3)
Sodium: 136 mEq/L — ABNORMAL LOW (ref 137–147)

## 2013-09-15 LAB — CBC
HCT: 29.4 % — ABNORMAL LOW (ref 39.0–52.0)
HEMOGLOBIN: 9.7 g/dL — AB (ref 13.0–17.0)
MCH: 29.1 pg (ref 26.0–34.0)
MCHC: 33 g/dL (ref 30.0–36.0)
MCV: 88.3 fL (ref 78.0–100.0)
Platelets: 355 10*3/uL (ref 150–400)
RBC: 3.33 MIL/uL — AB (ref 4.22–5.81)
RDW: 13.7 % (ref 11.5–15.5)
WBC: 9.1 10*3/uL (ref 4.0–10.5)

## 2013-09-15 MED ORDER — HYOSCYAMINE SULFATE 0.125 MG SL SUBL
0.2500 mg | SUBLINGUAL_TABLET | SUBLINGUAL | Status: DC | PRN
  Filled 2013-09-15: qty 2

## 2013-09-15 MED ORDER — SODIUM CHLORIDE 0.9 % IJ SOLN: 3.0000 mL | INTRAMUSCULAR | Status: DC | PRN

## 2013-09-15 MED ORDER — HYOSCYAMINE SULFATE 0.125 MG SL SUBL
0.2500 mg | SUBLINGUAL_TABLET | Freq: Once | SUBLINGUAL | Status: AC
  Administered 2013-09-15: 0.25 mg via SUBLINGUAL
  Filled 2013-09-15: qty 2

## 2013-09-15 MED ORDER — SODIUM CHLORIDE 0.9 % IJ SOLN
3.0000 mL | Freq: Two times a day (BID) | INTRAMUSCULAR | Status: DC
  Administered 2013-09-15 – 2013-09-16 (×2): 3 mL via INTRAVENOUS

## 2013-09-15 NOTE — Progress Notes (Signed)
  Subjective: Patient reports an episode last night where his catheter became occluded with clot. This was flushed clear and he has been draining since. His hematuria has ceased. He has been having some mild bladder spasms. He denies any abdominal pain.  Objective: Vital signs in last 24 hours: Temp:  [97.4 F (36.3 C)-98.2 F (36.8 C)] 97.6 F (36.4 C) (06/13 0700) Pulse Rate:  [69-70] 70 (06/13 0700) Resp:  [18] 18 (06/13 0700) BP: (137-171)/(53-70) 171/70 mmHg (06/13 0700) SpO2:  [100 %] 100 % (06/13 0700)  Intake/Output from previous day: 06/12 0701 - 06/13 0700 In: 38480 [P.O.:360; IV Piggyback:50] Out: 25053 [Urine:38400] Intake/Output this shift: Total I/O In: 2700 [Other:2700] Out: -   Physical Exam:  General:alert, cooperative and no distress GI:  tenderness: In the suprapubic region that is mild. No masses noted here. His catheter is draining clear urine and currently are no clots.  Lab Results:  Recent Labs  09/13/13 0335 09/14/13 0344 09/15/13 0438  HGB 9.4* 9.4* 9.7*  HCT 28.0* 28.9* 29.4*   BMET  Recent Labs  09/14/13 0344 09/15/13 0438  NA 137 136*  K 4.8 4.4  CL 102 100  CO2 25 25  GLUCOSE 121* 113*  BUN 25* 20  CREATININE 1.53* 1.49*  CALCIUM 9.0 9.4   No results found for this basename: LABPT, INR,  in the last 72 hours No results found for this basename: LABURIN,  in the last 72 hours Results for orders placed during the hospital encounter of 09/10/13  MRSA PCR SCREENING     Status: None   Collection Time    09/11/13  2:48 AM      Result Value Ref Range Status   MRSA by PCR NEGATIVE  NEGATIVE Final   Comment:            The GeneXpert MRSA Assay (FDA     approved for NASAL specimens     only), is one component of a     comprehensive MRSA colonization     surveillance program. It is not     intended to diagnose MRSA     infection nor to guide or     monitor treatment for     MRSA infections.    Studies/Results: No results  found.  Assessment/Plan: Gross hematuria secondary to invasive transitional cell carcinoma the bladder: His urine has now completely cleared. I do suspect there are still some clots in the bladder and his catheter will be irrigated to completely remove all clots from his bladder. Once that is done and he his urine remains clear then it would appear he will be ready for discharge from a urologic standpoint. His Foley catheter will remain indwelling upon discharge. He will then followup with Dr. Gaynelle Arabian and Dr. Tresa Moore as an outpatient.   LOS: 5 days   [name] C 09/15/2013, 7:28 AM

## 2013-09-15 NOTE — Progress Notes (Signed)
Patient ID: Kenneth Solian., male   DOB: 05/24/1931, 80 y.o.   MRN: [id number]  [facility] PROGRESS NOTE  Kenneth Solian. [id number] DOB: [birth year] DOA: 09/10/2013 PCP: [name], MD  Brief narrative:  80 y.o. male with PMH significant for HTN, carotid artery stenosis, PVD, BPH, CRI, and vertigo who presented to the ED with main concern of sudden onset of urinary retention. He explains he saw his urologist in the office earlier in the day prior to this admission and was able to void but he suddenly stopped voiding. He had foley cath placed in ED and had 400 cc return. Since that cath clotted off, pt has required placement of second foley cath and had sustained a syncopal event post foley placement with hypotension. He was rescucitated with one liter NS and has responded well, BP stabilized and [facility] called to admit pt for further evaluation and management.   Principal Problem:  Syncope  - possibly related to repeated foley placement attempts imposed on acute blood loss anemia, note the drop in Hg from 13.4 --> 7.7  - pt reports having rough night with recurrent blood clots in the urine  - no events on telemetry over night  - pt remains asymptomatic with no chest pain or shortness of breath, 12 lead on admission indicates NSR with LVH, four sets of troponins negative  - discussed with cardiology on call, agree with close monitoring, no need for further intervention at this time  - 12 lead EKG 6/11 unchanged since admission  - will d/c telemetry as no events over the past 72 hours  Active Problems:  Urinary bladder cancer with persistent hematuria  - Muscle invasive bladder cancer, BPH, post TURP and TUR-BT. continuing to have gross hematuria.  - management pre urology  Urinary retention  - foley in place, management per urology  Acute blood loss anemia  - Hg drop 13.4 --> 7.7 --> 9.8 --> 9.4 --> 9.7, remains stable over the past 48 hours  - required 2 U PRBC 09/11/2013 with  appropriate increase in Hg post transfusion  - CBC in AM  UTI (lower urinary tract infection)  - continue Rocephin day #5/7 - WBC now WNL  CAD  - monitor on telemetry  Hyponatremia  - stable overall and WNL this AM  Renal failure, acute on chronic  - Cr still elevated, but continue to trend down  - repeat BMP in AM   Consultants:  Urology  Procedures/Studies:  US Renal 09/12/2013 Large heterogeneous bladder mass. Mild to moderate left hydronephrosis likely due to ureteral obstruction at the level of the ureteral trigone. Two left renal cysts. No other abnormalities.  Bladder, transurethral resection, left lateral wall, base, left trigone HIGH GRADE PAPILLARY AND INVASIVE UROTHELIAL CARCINOMA. MUSCULARIS PROPRIA TISSUE PRESENT AND INVOLVED WITH TUMOR.  Antibiotics:  Rocephin 6/9 -->  Code Status: Full  Family Communication: Pt and family at bedside  Disposition Plan: Home when medically stable   HPI/Subjective: No events overnight.   Objective: Filed Vitals:   09/14/13 0454 09/14/13 1413 09/14/13 1732 09/15/13 0700  BP: 151/67 137/53 161/68 171/70  Pulse: 65 69 69 70  Temp: 98.3 F (36.8 C) 97.4 F (36.3 C) 98.2 F (36.8 C) 97.6 F (36.4 C)  TempSrc: Oral Oral Oral Oral  Resp: 18 18 18 18   Height:      Weight:      SpO2: 100% 100% 100% 100%    Intake/Output Summary (Last 24 hours) at 09/15/13 1419 Last  data filed at 09/15/13 1334  Gross per 24 hour  Intake  18200 ml  Output  17425 ml  Net    775 ml    Exam:   General:  Pt is alert, follows commands appropriately, not in acute distress  Cardiovascular: Regular rate and rhythm, SEM 6/6, no rubs, no gallops  Respiratory: Clear to auscultation bilaterally, no wheezing, no crackles, no rhonchi  Abdomen: Soft, non tender, non distended, bowel sounds present, no guarding  Extremities: No edema, pulses DP and PT palpable bilaterally   Data Reviewed: Basic Metabolic Panel:  Recent Labs Lab 09/11/13 0745  09/12/13 0515 09/13/13 0335 09/14/13 0344 09/15/13 0438  NA 130* 138 138 137 136*  K 4.1 4.3 4.1 4.8 4.4  CL 98 103 102 102 100  CO2 21 24 24 25 25   GLUCOSE 121* 107* 111* 121* 113*  BUN 25* 20 21 25* 20  CREATININE 1.47* 1.65* 1.62* 1.53* 1.49*  CALCIUM 8.0* 8.6 8.9 9.0 9.4   CBC:  Recent Labs Lab 09/10/13 2330 09/11/13 0745 09/11/13 2015 09/12/13 0515 09/13/13 0335 09/14/13 0344 09/15/13 0438  WBC 12.7* 10.6*  --  10.2 10.6* 12.8* 9.1  NEUTROABS 8.0*  --   --   --   --   --   --   HGB 13.4 7.7* 9.5* 9.8* 9.4* 9.4* 9.7*  HCT 39.6 22.5* 28.3* 29.2* 28.0* 28.9* 29.4*  MCV 88.4 87.5  --  88.2 87.2 88.1 88.3  PLT 327 276  --  259 286 313 355   Cardiac Enzymes:  Recent Labs Lab 09/10/13 2330 09/11/13 0312 09/11/13 0745 09/11/13 1432  TROPONINI <0.30 <0.30 <0.30 <0.30     Recent Results (from the past 240 hour(s))  MRSA PCR SCREENING     Status: None   Collection Time    09/11/13  2:48 AM      Result Value Ref Range Status   MRSA by PCR NEGATIVE  NEGATIVE Final   Comment:            The GeneXpert MRSA Assay (FDA     approved for NASAL specimens     only), is one component of a     comprehensive MRSA colonization     surveillance program. It is not     intended to diagnose MRSA     infection nor to guide or     monitor treatment for     MRSA infections.     Scheduled Meds: . carboprost (HEMABATE) bladder irrigation - continuous   Bladder Irrigation Q24H   And  . sodium chloride irrigation  3,000 mL Bladder Irrigation Q24H  . cefTRIAXone (ROCEPHIN)  IV  1 g Intravenous Q24H  . cholecalciferol  1,000 Units Oral Daily  . pantoprazole  40 mg Oral Daily  . polyethylene glycol  17 g Oral Daily  . senna-docusate  1 tablet Oral BID   Continuous Infusions:   Faye Ramsay, MD  [facility] Pager [phone]  If 7PM-7AM, please contact night-coverage [email or web address] Password [email or web address] 09/15/2013, 2:19 PM   LOS: 5 days

## 2013-09-15 NOTE — Evaluation (Signed)
Physical Therapy Evaluation Patient Details Name: [name]. MRN: [id number] DOB: 1931-12-28 Today's Date: 09/15/2013   History of Present Illness  [name] is an 80 y/o male admitted with PMH significant for HTN, carotid artery stenosis, PVD, BPH, CRI, and vertigo who presented to the ED with main concern of sudden onset of urinary retention  Clinical Impression  Patient presents with mild weakness as compared to his baseline.  Has h/o vertigo but not bothered by it today.  Feel acute level therapy will benefit to maximize independence and try with cane next visit.  No follow up therapy needed at this time.    Follow Up Recommendations No PT follow up    Equipment Recommendations  None recommended by PT    Recommendations for Other Services       Precautions / Restrictions Precautions Precautions: None      Mobility  Bed Mobility Overal bed mobility: Modified Independent                Transfers Overall transfer level: Modified independent Equipment used: None             General transfer comment: stands with use of UE support  Ambulation/Gait Ambulation/Gait assistance: Min guard;Supervision Ambulation Distance (Feet): 250 Feet Assistive device: None Gait Pattern/deviations: WFL(Within Functional Limits)   Gait velocity interpretation: at or above normal speed for age/gender General Gait Details: see DGI  Stairs Stairs: Yes Stairs assistance: Min guard Stair Management: One rail Right;Forwards;Two rails;Alternating pattern Number of Stairs: 10 General stair comments: minguard for safety; able to traverse independently with rail  Wheelchair Mobility    Modified Rankin (Stroke Patients Only)       Balance                                 Standardized Balance Assessment Standardized Balance Assessment : Dynamic Gait Index   Dynamic Gait Index Level Surface: Normal Change in Gait Speed: Normal Gait with Horizontal Head  Turns: Normal Gait with Vertical Head Turns: Mild Impairment Gait and Pivot Turn: Normal Step Over Obstacle: Mild Impairment Step Around Obstacles: Normal Steps: Mild Impairment Total Score: 21       Pertinent Vitals/Pain Min c/o pain due to catheter just irrigated.    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)     Hand Dominance   Dominant Hand: Right    Extremity/Trunk Assessment               Lower Extremity Assessment: Overall WFL for tasks assessed         Communication   Communication: No difficulties  Cognition Arousal/Alertness: Awake/alert Behavior During Therapy: WFL for tasks assessed/performed Overall Cognitive Status: Within Functional Limits for tasks assessed                      General Comments      Exercises        Assessment/Plan    PT Assessment Patient needs continued PT services  PT Diagnosis Generalized weakness   PT Problem List  Decreased strength;Decreased balance;Decreased knowledge of use of DME  PT Treatment Interventions DME instruction;Gait training;Stair training;Functional mobility training;Therapeutic activities;Patient/family education;Balance training   PT Goals (Current goals can be found in the Care Plan section) Acute Rehab PT Goals Patient Stated Goal: To go home PT Goal Formulation: With patient/family Time For Goal Achievement: 09/22/13 Potential to Achieve Goals: Good    Frequency Min 3X/week   Barriers to discharge        Co-evaluation               End of Session Equipment Utilized During Treatment: Gait belt    Patient left: in bed           Time: 1358-1425 PT Time Calculation (min): 27 min   Charges:   PT Evaluation $Initial PT Evaluation Tier I: 1 Procedure PT Treatments $Gait Training: 8-22 mins   PT G Codes:          Chabeli Barsamian,CYNDI 2013/09/20, 3:18 PM Magda Kiel, [place] September 20, 2013

## 2013-09-16 LAB — BASIC METABOLIC PANEL
BUN: 20 mg/dL (ref 6–23)
CALCIUM: 9.1 mg/dL (ref 8.4–10.5)
CO2: 24 mEq/L (ref 19–32)
Chloride: 100 mEq/L (ref 96–112)
Creatinine, Ser: 1.59 mg/dL — ABNORMAL HIGH (ref 0.50–1.35)
GFR, EST AFRICAN AMERICAN: 46 mL/min — AB (ref >90)
GFR, EST NON AFRICAN AMERICAN: 39 mL/min — AB (ref >90)
GLUCOSE: 113 mg/dL — AB (ref 70–99)
POTASSIUM: 4.2 meq/L (ref 3.7–5.3)
Sodium: 137 mEq/L (ref 137–147)

## 2013-09-16 LAB — CBC
HEMATOCRIT: 28.2 % — AB (ref 39.0–52.0)
Hemoglobin: 9.3 g/dL — ABNORMAL LOW (ref 13.0–17.0)
MCH: 29.2 pg (ref 26.0–34.0)
MCHC: 33 g/dL (ref 30.0–36.0)
MCV: 88.7 fL (ref 78.0–100.0)
Platelets: 382 10*3/uL (ref 150–400)
RBC: 3.18 MIL/uL — AB (ref 4.22–5.81)
RDW: 13.5 % (ref 11.5–15.5)
WBC: 10.7 10*3/uL — ABNORMAL HIGH (ref 4.0–10.5)

## 2013-09-16 MED ORDER — PROMETHAZINE HCL 12.5 MG PO TABS: 12.5000 mg | ORAL_TABLET | Freq: Four times a day (QID) | ORAL | Status: DC | PRN

## 2013-09-16 MED ORDER — TRAMADOL-ACETAMINOPHEN 37.5-325 MG PO TABS: 1.0000 | ORAL_TABLET | Freq: Four times a day (QID) | ORAL | Status: DC | PRN

## 2013-09-16 MED ORDER — HYOSCYAMINE SULFATE 0.125 MG SL SUBL: 0.2500 mg | SUBLINGUAL_TABLET | Freq: Four times a day (QID) | SUBLINGUAL | Status: DC | PRN
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
## 2013-09-16 NOTE — Discharge Instructions (Signed)
°·   Please note we have discontinued Lisinopril until kidney function stabilizes, we will make your primary care doctor aware   Please check your blood pressure in one week and call me [phone]) or your primary doctor so that we can add medication for blood pressure control  Please also note you have completed antibiotic treatment with Rocephin while in the hospital and there is no need to continue antibiotic upon discharge   Hemoglobin on discharge is 9.3  Hematuria, Adult Hematuria is blood in your urine. It can be caused by a bladder infection, kidney infection, prostate infection, kidney stone, or cancer of your urinary tract. Infections can usually be treated with medicine, and a kidney stone usually will pass through your urine. If neither of these is the cause of your hematuria, further workup to find out the reason may be needed. It is very important that you tell your health care provider about any blood you see in your urine, even if the blood stops without treatment or happens without causing pain. Blood in your urine that happens and then stops and then happens again can be a symptom of a very serious condition. Also, pain is not a symptom in the initial stages of many urinary cancers. HOME CARE INSTRUCTIONS   Drink lots of fluid, 3 4 quarts a day. If you have been diagnosed with an infection, cranberry juice is especially recommended, in addition to large amounts of water.  Avoid caffeine, tea, and carbonated beverages, because they tend to irritate the bladder.  Avoid alcohol because it may irritate the prostate.  Only take over-the-counter or prescription medicines for pain, discomfort, or fever as directed by your health care provider.  If you have been diagnosed with a kidney stone, follow your health care provider's instructions regarding straining your urine to catch the stone.  Empty your bladder often. Avoid holding urine for long periods of time.  After a bowel  movement, women should cleanse front to back. Use each tissue only once.  Empty your bladder before and after sexual intercourse if you are a male. SEEK MEDICAL CARE IF: You develop back pain, fever, a feeling of sickness in your stomach (nausea), or vomiting or if your symptoms are not better in 3 days. Return sooner if you are getting worse. SEEK IMMEDIATE MEDICAL CARE IF:   You have a persistent fever, with a temperature of 101.53F (38.8C) or greater.  You develop severe vomiting and are unable to keep the medicine down.  You develop severe back or abdominal pain despite taking your medicines.  You begin passing a large amount of blood or clots in your urine.  You feel extremely weak or faint, or you pass out. MAKE SURE YOU:   Understand these instructions.  Will watch your condition.  Will get help right away if you are not doing well or get worse. Document Released: 03/22/2005 Document Revised: 01/10/2013 Document Reviewed: 11/20/2012 [facility] Patient Information 2014 [place].

## 2013-09-16 NOTE — Discharge Summary (Signed)
Physician Discharge Summary  [name]. ZJI:[id number] DOB: 1932-01-26 DOA: 09/10/2013  PCP: [name], MD  Admit date: 09/10/2013 Discharge date: 09/16/2013  Recommendations for Outpatient Follow-up:  1. Pt will need to follow up with PCP in 2-3 weeks post discharge 2. Please obtain BMP to evaluate electrolytes and kidney function 3. Please also check CBC to evaluate Hg and Hct levels 4. Please note that lisinopril was stopped due to acute on chronic renal failure  Discharge Diagnoses:  Principal Problem:   Syncope Active Problems:   CAD   Urinary bladder cancer   S/P TURP   Hypotension   Urinary retention   Hematuria, gross   UTI (lower urinary tract infection)   Hyponatremia   Renal failure, acute on chronic  Discharge Condition: Stable  Diet recommendation: Heart healthy diet discussed in details   Brief narrative:  80 y.o. male with PMH significant for HTN, carotid artery stenosis, PVD, BPH, CRI, and vertigo who presented to the ED with main concern of sudden onset of urinary retention. He explains he saw his urologist in the office earlier in the day prior to this admission and was able to void but he suddenly stopped voiding. He had foley cath placed in ED and had 400 cc return. Since that cath clotted off, pt has required placement of second foley cath and had sustained a syncopal event post foley placement with hypotension. He was rescucitated with one liter NS and has responded well, BP stabilized and [facility] called to admit pt for further evaluation and management.   Principal Problem:  Syncope  - possibly related to repeated foley placement attempts imposed on acute blood loss anemia, note the drop in Hg from 13.4 --> 7.7  - pt reports having rough night with recurrent blood clots in the urine  - no events on telemetry in 72 hours  - pt remains asymptomatic with no chest pain or shortness of breath, 12 lead on admission indicates NSR with LVH, four sets of troponins  negative  - discussed with cardiology on call, agree with close monitoring, no need for further intervention at this time  - 12 lead EKG 6/11 unchanged since admission  Active Problems:  Urinary bladder cancer with persistent hematuria  - Muscle invasive bladder cancer, BPH, post TURP and TUR-BT. continuing to have gross hematuria.  - management pre urology  Urinary retention  - foley in place, management per urology  Acute blood loss anemia  - Hg drop 13.4 --> 7.7 --> 9.8 --> 9.4 --> 9.7 --> 9.3, remains stable over the past 48 hours  - required 2 U PRBC 09/11/2013 with appropriate increase in Hg post transfusion  UTI (lower urinary tract infection)  - completed treatment with Rocephin while inpatient  CAD  - monitor on telemetry  Hyponatremia  - stable overall and WNL this AM  Renal failure, acute on chronic  - Cr still elevated, lisinopril d/c  Consultants:  Urology  Procedures/Studies:  US Renal 09/12/2013 Large heterogeneous bladder mass. Mild to moderate left hydronephrosis likely due to ureteral obstruction at the level of the ureteral trigone. Two left renal cysts. No other abnormalities.  Bladder, transurethral resection, left lateral wall, base, left trigone HIGH GRADE PAPILLARY AND INVASIVE UROTHELIAL CARCINOMA. MUSCULARIS PROPRIA TISSUE PRESENT AND INVOLVED WITH TUMOR.  Antibiotics:  Rocephin 6/9 --> Code Status: Full  Family Communication: Pt and family at bedside  Disposition Plan: Home when medically stable      Discharge Exam: Filed Vitals:   09/16/13  0500  BP: 143/68  Pulse: 58  Temp: 98.2 F (36.8 C)  Resp: 20   Filed Vitals:   09/15/13 0700 09/15/13 1425 09/15/13 2151 09/16/13 0500  BP: 171/70  139/69 143/68  Pulse: 70  58 58  Temp: 97.6 F (36.4 C) 97.2 F (36.2 C) 97.4 F (36.3 C) 98.2 F (36.8 C)  TempSrc: Oral Axillary Oral Oral  Resp: 18  20 20   Height:      Weight:      SpO2: 100% 100% 99% 98%    General: Pt is alert, follows commands  appropriately, not in acute distress Cardiovascular: Regular rate and rhythm, SEM 6/6, no rubs, no gallops Respiratory: Clear to auscultation bilaterally, no wheezing, no crackles, no rhonchi Abdominal: Soft, non tender, non distended, bowel sounds +, no guarding Extremities: no edema, no cyanosis, pulses palpable bilaterally DP and PT Neuro: Grossly nonfocal  Discharge Instructions  Discharge Instructions   Diet - low sodium heart healthy    Complete by:  As directed      Increase activity slowly    Complete by:  As directed             Medication List    STOP taking these medications       doxycycline 50 MG capsule  Commonly known as:  VIBRAMYCIN     lisinopril 20 MG tablet  Commonly known as:  PRINIVIL,ZESTRIL     trimethoprim 100 MG tablet  Commonly known as:  TRIMPEX      TAKE these medications       cholecalciferol 1000 UNITS tablet  Commonly known as:  VITAMIN D  Take 1,000 Units by mouth daily.     Fish Oil 1000 MG Caps  Take 1,000 mg by mouth 2 (two) times a week.     hyoscyamine 0.125 MG SL tablet  Commonly known as:  LEVSIN SL  Place 2 tablets (0.25 mg total) under the tongue every 6 (six) hours as needed for cramping (Bladder spasms).     omeprazole 40 MG capsule  Commonly known as:  PRILOSEC  Take 40 mg by mouth daily.     phenazopyridine 200 MG tablet  Commonly known as:  PYRIDIUM  Take 1 tablet (200 mg total) by mouth 3 (three) times daily as needed for pain.     promethazine 12.5 MG tablet  Commonly known as:  PHENERGAN  Take 1 tablet (12.5 mg total) by mouth every 6 (six) hours as needed for nausea.     traMADol-acetaminophen 37.5-325 MG per tablet  Commonly known as:  ULTRACET  Take 1 tablet by mouth every 6 (six) hours as needed.           Follow-up Information   Schedule an appointment as soon as possible for a visit with [name], MD.   Specialty:  [facility] Medicine   Contact information:   [place] [address] [phone]       Follow up with Faye Ramsay, MD. (please call me with any concerns [phone])    Specialty:  Internal Medicine   Contact information:   [address] [place] [address] [phone]        The results of significant diagnostics from this hospitalization (including imaging, microbiology, ancillary and laboratory) are listed below for reference.     Microbiology: Recent Results (from the past 240 hour(s))  MRSA PCR SCREENING     Status: None   Collection Time    09/11/13  2:48 AM      Result Value Ref Range Status   MRSA by PCR NEGATIVE  NEGATIVE Final   Comment:            The GeneXpert MRSA Assay (FDA     approved for NASAL specimens     only), is one component of a     comprehensive MRSA colonization     surveillance program. It is not     intended to diagnose MRSA     infection nor to guide or     monitor treatment for     MRSA infections.     Labs: Basic Metabolic Panel:  Recent Labs Lab 09/12/13 0515 09/13/13 0335 09/14/13 0344 09/15/13 0438 09/16/13 0516  NA 138 138 137 136* 137  K 4.3 4.1 4.8 4.4 4.2  CL 103 102 102 100 100  CO2 24 24 25 25 24   GLUCOSE 107* 111* 121* 113* 113*  BUN 20 21 25* 20 20  CREATININE 1.65* 1.62* 1.53* 1.49* 1.59*  CALCIUM 8.6 8.9 9.0 9.4 9.1   CBC:  Recent Labs Lab 09/10/13 2330  09/12/13 0515 09/13/13 0335 09/14/13 0344 09/15/13 0438 09/16/13 0516  WBC 12.7*  < > 10.2 10.6* 12.8* 9.1 10.7*  NEUTROABS 8.0*  --   --   --   --   --   --   HGB 13.4  < > 9.8* 9.4* 9.4* 9.7* 9.3*  HCT 39.6  < > 29.2* 28.0* 28.9* 29.4* 28.2*  MCV 88.4  < > 88.2 87.2 88.1 88.3 88.7  PLT 327  < > 259 286 313 355 382  < > = values in this interval not displayed. Cardiac Enzymes:  Recent Labs Lab 09/10/13 2330 09/11/13 0312 09/11/13 0745 09/11/13 1432  TROPONINI <0.30 <0.30 <0.30 <0.30     SIGNED: Time coordinating discharge: Over 30 minutes  Faye Ramsay,  MD  [facility] 09/16/2013, 10:57 AM Pager [phone]  If 7PM-7AM, please contact night-coverage [email or web address] Password [email or web address]

## 2013-09-17 ENCOUNTER — Other Ambulatory Visit: Payer: Self-pay | Admitting: Urology

## 2013-09-19 ENCOUNTER — Ambulatory Visit: Payer: Medicare HMO | Admitting: Cardiovascular Disease

## 2013-10-02 ENCOUNTER — Encounter: Payer: Self-pay | Admitting: Family Medicine

## 2013-10-02 ENCOUNTER — Ambulatory Visit (INDEPENDENT_AMBULATORY_CARE_PROVIDER_SITE_OTHER): Payer: Medicare HMO | Admitting: Family Medicine

## 2013-10-02 VITALS — BP 132/60 | HR 60 | Temp 98.1°F | Wt 171.0 lb

## 2013-10-02 DIAGNOSIS — N189 Chronic kidney disease, unspecified: Secondary | ICD-10-CM

## 2013-10-02 DIAGNOSIS — N179 Acute kidney failure, unspecified: Secondary | ICD-10-CM

## 2013-10-02 DIAGNOSIS — D62 Acute posthemorrhagic anemia: Secondary | ICD-10-CM

## 2013-10-02 LAB — BASIC METABOLIC PANEL
BUN: 15 mg/dL (ref 6–23)
CALCIUM: 9.4 mg/dL (ref 8.4–10.5)
CO2: 27 meq/L (ref 19–32)
CREATININE: 1.6 mg/dL — AB (ref 0.4–1.5)
Chloride: 99 mEq/L (ref 96–112)
GFR: 45.36 mL/min — AB (ref >60.00)
GLUCOSE: 90 mg/dL (ref 70–99)
Potassium: 4.9 mEq/L (ref 3.5–5.1)
Sodium: 133 mEq/L — ABNORMAL LOW (ref 135–145)

## 2013-10-02 LAB — CBC WITH DIFFERENTIAL/PLATELET
BASOS PCT: 0.6 % (ref 0.0–3.0)
Basophils Absolute: 0.1 10*3/uL (ref 0.0–0.1)
EOS PCT: 4.6 % (ref 0.0–5.0)
Eosinophils Absolute: 0.5 10*3/uL (ref 0.0–0.7)
HEMATOCRIT: 35.5 % — AB (ref 39.0–52.0)
HEMOGLOBIN: 11.5 g/dL — AB (ref 13.0–17.0)
LYMPHS ABS: 2.4 10*3/uL (ref 0.7–4.0)
Lymphocytes Relative: 22.3 % (ref 12.0–46.0)
MCHC: 32.4 g/dL (ref 30.0–36.0)
MCV: 87.5 fl (ref 78.0–100.0)
MONO ABS: 0.7 10*3/uL (ref 0.1–1.0)
MONOS PCT: 6.4 % (ref 3.0–12.0)
NEUTROS ABS: 7.1 10*3/uL (ref 1.4–7.7)
Neutrophils Relative %: 66.1 % (ref 43.0–77.0)
Platelets: 409 10*3/uL — ABNORMAL HIGH (ref 150.0–400.0)
RBC: 4.06 Mil/uL — ABNORMAL LOW (ref 4.22–5.81)
RDW: 14.5 % (ref 11.5–15.5)
WBC: 10.7 10*3/uL — AB (ref 4.0–10.5)

## 2013-10-02 NOTE — Progress Notes (Signed)
Pre visit review using our clinic review tool, if applicable. No additional management support is needed unless otherwise documented below in the visit note.  Inpatient f/u.  Hospital labs and notes reviewed.  TURBT with hematuria afterward, had blocked urethra and needed readmission for cath.  Anemia noted, syncope at time of foley placement.  Inc in Cr also noted and ACE held.  Due for f/u labs.    Still with leg bag in place. occ bloody urine in bag noted but no fevers, no pain.  Had f/u with uro and has planned resection of prostate and bladder.  Still on ASA currently.  Has taken one dose of ACE, today.   PMH and SH reviewed  ROS: See HPI, otherwise noncontributory.  Meds, vitals, and allergies reviewed.   GEN: nad, alert and oriented HEENT: mucous membranes moist NECK: supple w/o LA CV: rrr.  PULM: ctab, no inc wob ABD: soft, +bs EXT w/o edema Leg bag in place

## 2013-10-02 NOTE — Patient Instructions (Addendum)
Miralax is a reasonable option to help with constipation.  Go to the lab on the way out.  We'll contact you with your lab report.

## 2013-10-03 ENCOUNTER — Encounter: Payer: Self-pay | Admitting: Family Medicine

## 2013-10-03 DIAGNOSIS — D62 Acute posthemorrhagic anemia: Secondary | ICD-10-CM | POA: Insufficient documentation

## 2013-10-03 DIAGNOSIS — I350 Nonrheumatic aortic (valve) stenosis: Secondary | ICD-10-CM

## 2013-10-03 HISTORY — DX: Nonrheumatic aortic (valve) stenosis: I35.0

## 2013-10-03 NOTE — Assessment & Plan Note (Signed)
See notes on labs.  Stay off ACE for now, esp since CP isn't up.  D/w pt.  He agrees.

## 2013-10-03 NOTE — Assessment & Plan Note (Addendum)
See notes on labs.   Still with occ bloody urine but no CP and not orthostatic.  Will ask for cards input on ASA use before the procedure.  >25 minutes spent in face to face time with patient, >50% spent in counselling or coordination of care.

## 2013-10-08 ENCOUNTER — Telehealth: Payer: Self-pay

## 2013-10-08 NOTE — Telephone Encounter (Signed)
Sent clearance letter that pt is acceptable risk for upcoming robotic cystectomy w/ ileal conduit urinary diversion on 10/26/13.

## 2013-10-15 ENCOUNTER — Encounter: Payer: Self-pay | Admitting: Cardiovascular Disease

## 2013-10-15 NOTE — Telephone Encounter (Signed)
This encounter was created in error - please disregard.

## 2013-10-19 ENCOUNTER — Telehealth: Payer: Self-pay | Admitting: *Deleted

## 2013-10-19 NOTE — Telephone Encounter (Signed)
Patient called back and doesn't want to schedule at this time. He informed me that he will be having bladder surgery next week due to dx of cancer. Will schedule carotid dopper when he is feeling better.

## 2013-10-19 NOTE — Telephone Encounter (Signed)
Lmom to call our office. Time to schedule carotid doppler (6 mth f/u)

## 2013-10-22 ENCOUNTER — Telehealth: Payer: Self-pay

## 2013-10-22 NOTE — Telephone Encounter (Signed)
Have him call uro asap.  Thanks.

## 2013-10-22 NOTE — Telephone Encounter (Signed)
Pt left v/m; pt said Dr Damita Dunnings has been monitoring pt between surgeries; pt had surgery in June and another surgery scheduled for 10/26/13. Pt noticed increased bleeding on 10/17/13; pt has indwelling catheter and bleeding appears extensive. Pt wants to know if Dr Damita Dunnings wants to see pt or let Dr Tammi Klippel know prior to surgery. Pt request cb.

## 2013-10-22 NOTE — Telephone Encounter (Signed)
Patient advised.  Patient said he called at the end of last week to Urology and they told him that it was to be expected.  I advised the patient to be sure Urology understands the extent of the bleeding and that if it's a lot of bleeding, he should go to the ER per Dr. Damita Dunnings.

## 2013-10-25 ENCOUNTER — Inpatient Hospital Stay (HOSPITAL_COMMUNITY)
Admission: RE | Admit: 2013-10-25 | Discharge: 2013-11-01 | DRG: 654 | Disposition: A | Discharge status: Home / Self Care | Payer: Medicare HMO | Attending: Urology | Admitting: Urology

## 2013-10-25 ENCOUNTER — Encounter (HOSPITAL_COMMUNITY): Payer: Self-pay | Admitting: Urology

## 2013-10-25 DIAGNOSIS — Z860100 Personal history of colon polyps, unspecified: Secondary | ICD-10-CM

## 2013-10-25 DIAGNOSIS — R011 Cardiac murmur, unspecified: Secondary | ICD-10-CM | POA: Diagnosis present

## 2013-10-25 DIAGNOSIS — I209 Angina pectoris, unspecified: Secondary | ICD-10-CM | POA: Diagnosis not present

## 2013-10-25 DIAGNOSIS — Y836 Removal of other organ (partial) (total) as the cause of abnormal reaction of the patient, or of later complication, without mention of misadventure at the time of the procedure: Secondary | ICD-10-CM | POA: Diagnosis not present

## 2013-10-25 DIAGNOSIS — Z888 Allergy status to other drugs, medicaments and biological substances status: Secondary | ICD-10-CM | POA: Diagnosis not present

## 2013-10-25 DIAGNOSIS — H9190 Unspecified hearing loss, unspecified ear: Secondary | ICD-10-CM | POA: Diagnosis present

## 2013-10-25 DIAGNOSIS — E872 Acidosis, unspecified: Secondary | ICD-10-CM | POA: Diagnosis present

## 2013-10-25 DIAGNOSIS — J9819 Other pulmonary collapse: Secondary | ICD-10-CM | POA: Diagnosis not present

## 2013-10-25 DIAGNOSIS — M129 Arthropathy, unspecified: Secondary | ICD-10-CM | POA: Diagnosis present

## 2013-10-25 DIAGNOSIS — Z8249 Family history of ischemic heart disease and other diseases of the circulatory system: Secondary | ICD-10-CM | POA: Diagnosis not present

## 2013-10-25 DIAGNOSIS — C689 Malignant neoplasm of urinary organ, unspecified: Secondary | ICD-10-CM | POA: Diagnosis not present

## 2013-10-25 DIAGNOSIS — I6529 Occlusion and stenosis of unspecified carotid artery: Secondary | ICD-10-CM | POA: Diagnosis present

## 2013-10-25 DIAGNOSIS — J988 Other specified respiratory disorders: Secondary | ICD-10-CM | POA: Diagnosis not present

## 2013-10-25 DIAGNOSIS — I358 Other nonrheumatic aortic valve disorders: Secondary | ICD-10-CM

## 2013-10-25 DIAGNOSIS — K921 Melena: Secondary | ICD-10-CM

## 2013-10-25 DIAGNOSIS — N179 Acute kidney failure, unspecified: Secondary | ICD-10-CM

## 2013-10-25 DIAGNOSIS — I4891 Unspecified atrial fibrillation: Secondary | ICD-10-CM

## 2013-10-25 DIAGNOSIS — I739 Peripheral vascular disease, unspecified: Secondary | ICD-10-CM | POA: Diagnosis present

## 2013-10-25 DIAGNOSIS — I2510 Atherosclerotic heart disease of native coronary artery without angina pectoris: Secondary | ICD-10-CM | POA: Diagnosis present

## 2013-10-25 DIAGNOSIS — I359 Nonrheumatic aortic valve disorder, unspecified: Secondary | ICD-10-CM | POA: Diagnosis present

## 2013-10-25 DIAGNOSIS — R319 Hematuria, unspecified: Secondary | ICD-10-CM | POA: Diagnosis present

## 2013-10-25 DIAGNOSIS — N183 Chronic kidney disease, stage 3 unspecified: Secondary | ICD-10-CM | POA: Diagnosis present

## 2013-10-25 DIAGNOSIS — R9431 Abnormal electrocardiogram [ECG] [EKG]: Secondary | ICD-10-CM

## 2013-10-25 DIAGNOSIS — N189 Chronic kidney disease, unspecified: Secondary | ICD-10-CM

## 2013-10-25 DIAGNOSIS — Z79899 Other long term (current) drug therapy: Secondary | ICD-10-CM

## 2013-10-25 DIAGNOSIS — Z9079 Acquired absence of other genital organ(s): Secondary | ICD-10-CM

## 2013-10-25 DIAGNOSIS — Z87891 Personal history of nicotine dependence: Secondary | ICD-10-CM

## 2013-10-25 DIAGNOSIS — Z85828 Personal history of other malignant neoplasm of skin: Secondary | ICD-10-CM

## 2013-10-25 DIAGNOSIS — C679 Malignant neoplasm of bladder, unspecified: Secondary | ICD-10-CM | POA: Diagnosis present

## 2013-10-25 DIAGNOSIS — I9589 Other hypotension: Secondary | ICD-10-CM | POA: Diagnosis not present

## 2013-10-25 DIAGNOSIS — D62 Acute posthemorrhagic anemia: Secondary | ICD-10-CM | POA: Diagnosis not present

## 2013-10-25 DIAGNOSIS — I498 Other specified cardiac arrhythmias: Secondary | ICD-10-CM

## 2013-10-25 DIAGNOSIS — I480 Paroxysmal atrial fibrillation: Secondary | ICD-10-CM

## 2013-10-25 DIAGNOSIS — R079 Chest pain, unspecified: Secondary | ICD-10-CM | POA: Diagnosis not present

## 2013-10-25 DIAGNOSIS — E785 Hyperlipidemia, unspecified: Secondary | ICD-10-CM | POA: Diagnosis present

## 2013-10-25 DIAGNOSIS — D649 Anemia, unspecified: Secondary | ICD-10-CM | POA: Diagnosis present

## 2013-10-25 DIAGNOSIS — I4949 Other premature depolarization: Secondary | ICD-10-CM | POA: Diagnosis not present

## 2013-10-25 DIAGNOSIS — Z951 Presence of aortocoronary bypass graft: Secondary | ICD-10-CM | POA: Diagnosis not present

## 2013-10-25 DIAGNOSIS — R000 Tachycardia, unspecified: Secondary | ICD-10-CM | POA: Diagnosis not present

## 2013-10-25 DIAGNOSIS — F411 Generalized anxiety disorder: Secondary | ICD-10-CM | POA: Diagnosis present

## 2013-10-25 DIAGNOSIS — E7800 Pure hypercholesterolemia, unspecified: Secondary | ICD-10-CM

## 2013-10-25 DIAGNOSIS — I129 Hypertensive chronic kidney disease with stage 1 through stage 4 chronic kidney disease, or unspecified chronic kidney disease: Secondary | ICD-10-CM | POA: Diagnosis present

## 2013-10-25 DIAGNOSIS — K219 Gastro-esophageal reflux disease without esophagitis: Secondary | ICD-10-CM | POA: Diagnosis present

## 2013-10-25 DIAGNOSIS — I493 Ventricular premature depolarization: Secondary | ICD-10-CM

## 2013-10-25 DIAGNOSIS — R195 Other fecal abnormalities: Secondary | ICD-10-CM | POA: Diagnosis not present

## 2013-10-25 DIAGNOSIS — I10 Essential (primary) hypertension: Secondary | ICD-10-CM

## 2013-10-25 DIAGNOSIS — I25810 Atherosclerosis of coronary artery bypass graft(s) without angina pectoris: Secondary | ICD-10-CM

## 2013-10-25 DIAGNOSIS — I350 Nonrheumatic aortic (valve) stenosis: Secondary | ICD-10-CM

## 2013-10-25 HISTORY — DX: Atherosclerotic heart disease of native coronary artery without angina pectoris: I25.10

## 2013-10-25 HISTORY — DX: Unspecified malignant neoplasm of skin, unspecified: C44.90

## 2013-10-25 HISTORY — DX: Chronic kidney disease, stage 3 (moderate): N18.3

## 2013-10-25 HISTORY — DX: Chronic kidney disease, stage 3 unspecified: N18.30

## 2013-10-25 HISTORY — DX: Anemia, unspecified: D64.9

## 2013-10-25 HISTORY — DX: Malignant neoplasm of bladder, unspecified: C67.9

## 2013-10-25 HISTORY — DX: Nonrheumatic aortic (valve) stenosis: I35.0

## 2013-10-25 HISTORY — DX: Syncope and collapse: R55

## 2013-10-25 LAB — COMPREHENSIVE METABOLIC PANEL
ALBUMIN: 3.6 g/dL (ref 3.5–5.2)
ALT: 8 U/L (ref 0–53)
ANION GAP: 13 (ref 5–15)
AST: 21 U/L (ref 0–37)
Alkaline Phosphatase: 109 U/L (ref 39–117)
BUN: 19 mg/dL (ref 6–23)
CALCIUM: 9.6 mg/dL (ref 8.4–10.5)
CO2: 25 mEq/L (ref 19–32)
Chloride: 97 mEq/L (ref 96–112)
Creatinine, Ser: 1.46 mg/dL — ABNORMAL HIGH (ref 0.50–1.35)
GFR calc non Af Amer: 44 mL/min — ABNORMAL LOW (ref >90)
GFR, EST AFRICAN AMERICAN: 51 mL/min — AB (ref >90)
Glucose, Bld: 114 mg/dL — ABNORMAL HIGH (ref 70–99)
POTASSIUM: 5 meq/L (ref 3.7–5.3)
SODIUM: 135 meq/L — AB (ref 137–147)
TOTAL PROTEIN: 7.3 g/dL (ref 6.0–8.3)
Total Bilirubin: 0.3 mg/dL (ref 0.3–1.2)

## 2013-10-25 LAB — CBC
HCT: 30.3 % — ABNORMAL LOW (ref 39.0–52.0)
Hemoglobin: 9.9 g/dL — ABNORMAL LOW (ref 13.0–17.0)
MCH: 27.1 pg (ref 26.0–34.0)
MCHC: 32.7 g/dL (ref 30.0–36.0)
MCV: 83 fL (ref 78.0–100.0)
Platelets: 417 10*3/uL — ABNORMAL HIGH (ref 150–400)
RBC: 3.65 MIL/uL — ABNORMAL LOW (ref 4.22–5.81)
RDW: 13.5 % (ref 11.5–15.5)
WBC: 7.2 10*3/uL (ref 4.0–10.5)

## 2013-10-25 LAB — PREPARE RBC (CROSSMATCH)

## 2013-10-25 MED ORDER — PEG 3350-KCL-NA BICARB-NACL 420 G PO SOLR
4000.0000 mL | Freq: Once | ORAL | Status: AC
  Administered 2013-10-25: 4000 mL via ORAL

## 2013-10-25 MED ORDER — DEXTROSE-NACL 5-0.45 % IV SOLN
INTRAVENOUS | Status: DC
  Administered 2013-10-25: 15:00:00 via INTRAVENOUS

## 2013-10-25 MED ORDER — FLEET ENEMA 7-19 GM/118ML RE ENEM
1.0000 | ENEMA | Freq: Once | RECTAL | Status: DC
  Filled 2013-10-25: qty 1

## 2013-10-25 MED ORDER — PIPERACILLIN-TAZOBACTAM 3.375 G IVPB 30 MIN
3.3750 g | Freq: Once | INTRAVENOUS | Status: DC
Start: 2013-10-25 — End: 2013-10-25

## 2013-10-25 MED ORDER — PIPERACILLIN-TAZOBACTAM IN DEX 2-0.25 GM/50ML IV SOLN
2.2500 g | Freq: Once | INTRAVENOUS | Status: AC
  Administered 2013-10-25: 2.25 g via INTRAVENOUS
  Filled 2013-10-25: qty 50

## 2013-10-25 NOTE — Consult Note (Signed)
WOC preop stoma site marking Met with patient and explained stoma creation.  Pt and family at the bedside and knowledgeable about the surgical procedure. Reviewed educational materials with patient and family and showed the patient a 1pc and 2pc urostomy pouch. Discussed the differences and connection to bedside drainage.  Evaluated patient in a sitting, lying and standing position. Marked patient for RLQ ileal conduit 5cm to the right and 4cm below the umbilicus. Attempted to avoid beltline and placed within the rectus muscle. No abdominal contour issues.   Pt able to visualize the stoma well and was happy with location.  Son and wife at the bedside, very supportive.    [place] team will follow up with patient after his surgery for ostomy care and teaching. Mckyla Deckman Diablock RN,CWOCN [phone]
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
## 2013-10-26 ENCOUNTER — Inpatient Hospital Stay (HOSPITAL_COMMUNITY): Payer: Medicare HMO | Admitting: Anesthesiology

## 2013-10-26 ENCOUNTER — Encounter (HOSPITAL_COMMUNITY): Payer: Self-pay | Admitting: Physician Assistant

## 2013-10-26 ENCOUNTER — Encounter (HOSPITAL_COMMUNITY)
Admission: RE | Disposition: A | Discharge status: Home / Self Care | Payer: Self-pay | Source: Home / Self Care | Attending: Urology

## 2013-10-26 ENCOUNTER — Inpatient Hospital Stay (HOSPITAL_COMMUNITY): Payer: Medicare HMO

## 2013-10-26 ENCOUNTER — Encounter (HOSPITAL_COMMUNITY): Payer: Medicare HMO | Admitting: Anesthesiology

## 2013-10-26 DIAGNOSIS — D62 Acute posthemorrhagic anemia: Secondary | ICD-10-CM

## 2013-10-26 DIAGNOSIS — I25810 Atherosclerosis of coronary artery bypass graft(s) without angina pectoris: Secondary | ICD-10-CM

## 2013-10-26 DIAGNOSIS — R9431 Abnormal electrocardiogram [ECG] [EKG]: Secondary | ICD-10-CM | POA: Diagnosis present

## 2013-10-26 DIAGNOSIS — I4949 Other premature depolarization: Secondary | ICD-10-CM

## 2013-10-26 DIAGNOSIS — I493 Ventricular premature depolarization: Secondary | ICD-10-CM | POA: Clinically undetermined

## 2013-10-26 DIAGNOSIS — D649 Anemia, unspecified: Secondary | ICD-10-CM | POA: Diagnosis present

## 2013-10-26 DIAGNOSIS — E7800 Pure hypercholesterolemia, unspecified: Secondary | ICD-10-CM

## 2013-10-26 DIAGNOSIS — N183 Chronic kidney disease, stage 3 unspecified: Secondary | ICD-10-CM | POA: Diagnosis present

## 2013-10-26 DIAGNOSIS — Z9889 Other specified postprocedural states: Secondary | ICD-10-CM

## 2013-10-26 DIAGNOSIS — I359 Nonrheumatic aortic valve disorder, unspecified: Secondary | ICD-10-CM

## 2013-10-26 DIAGNOSIS — C679 Malignant neoplasm of bladder, unspecified: Secondary | ICD-10-CM

## 2013-10-26 DIAGNOSIS — I10 Essential (primary) hypertension: Secondary | ICD-10-CM

## 2013-10-26 DIAGNOSIS — I739 Peripheral vascular disease, unspecified: Secondary | ICD-10-CM | POA: Diagnosis present

## 2013-10-26 DIAGNOSIS — I2510 Atherosclerotic heart disease of native coronary artery without angina pectoris: Secondary | ICD-10-CM

## 2013-10-26 HISTORY — PX: CYSTOSCOPY: SHX5120

## 2013-10-26 HISTORY — PX: ROBOT ASSISTED LAPAROSCOPIC COMPLETE CYSTECT ILEAL CONDUIT: SHX5139

## 2013-10-26 LAB — BLOOD GAS, ARTERIAL
Acid-base deficit: 5.3 mmol/L — ABNORMAL HIGH (ref 0.0–2.0)
Bicarbonate: 20.3 mEq/L (ref 20.0–24.0)
O2 CONTENT: 8 L/min
O2 Saturation: 98 %
PCO2 ART: 43.1 mmHg (ref 35.0–45.0)
Patient temperature: 98.6
TCO2: 19.4 mmol/L (ref 0–100)
pH, Arterial: 7.296 — ABNORMAL LOW (ref 7.350–7.450)
pO2, Arterial: 128 mmHg — ABNORMAL HIGH (ref 80.0–100.0)

## 2013-10-26 LAB — SURGICAL PCR SCREEN
MRSA, PCR: NEGATIVE
Staphylococcus aureus: NEGATIVE

## 2013-10-26 LAB — BASIC METABOLIC PANEL
Anion gap: 13 (ref 5–15)
Anion gap: 12 (ref 5–15)
BUN: 15 mg/dL (ref 6–23)
BUN: 16 mg/dL (ref 6–23)
CO2: 19 mEq/L (ref 19–32)
CO2: 19 mEq/L (ref 19–32)
CREATININE: 1.35 mg/dL (ref 0.50–1.35)
Calcium: 8.6 mg/dL (ref 8.4–10.5)
Calcium: 8.1 mg/dL — ABNORMAL LOW (ref 8.4–10.5)
Chloride: 102 mEq/L (ref 96–112)
Chloride: 101 mEq/L (ref 96–112)
Creatinine, Ser: 1.47 mg/dL — ABNORMAL HIGH (ref 0.50–1.35)
GFR calc Af Amer: 56 mL/min — ABNORMAL LOW (ref >90)
GFR calc Af Amer: 50 mL/min — ABNORMAL LOW (ref >90)
GFR calc non Af Amer: 48 mL/min — ABNORMAL LOW (ref >90)
GFR calc non Af Amer: 43 mL/min — ABNORMAL LOW (ref >90)
GLUCOSE: 190 mg/dL — AB (ref 70–99)
Glucose, Bld: 171 mg/dL — ABNORMAL HIGH (ref 70–99)
Potassium: 4.4 mEq/L (ref 3.7–5.3)
Potassium: 4.7 mEq/L (ref 3.7–5.3)
Sodium: 134 mEq/L — ABNORMAL LOW (ref 137–147)
Sodium: 132 mEq/L — ABNORMAL LOW (ref 137–147)

## 2013-10-26 LAB — TROPONIN I: Troponin I: <0.3 ng/mL (ref <0.30)

## 2013-10-26 LAB — HEMOGLOBIN AND HEMATOCRIT, BLOOD
HCT: 28.7 % — ABNORMAL LOW (ref 39.0–52.0)
Hemoglobin: 9.5 g/dL — ABNORMAL LOW (ref 13.0–17.0)

## 2013-10-26 SURGERY — ROBOTIC ASSISTED LAPAROSCOPIC COMPLETE CYSTECT ILEAL CONDUIT
Anesthesia: General | Site: Bladder

## 2013-10-26 MED ORDER — FENTANYL CITRATE 0.05 MG/ML IJ SOLN
INTRAMUSCULAR | Status: AC
  Filled 2013-10-26: qty 2

## 2013-10-26 MED ORDER — ONDANSETRON HCL 4 MG/2ML IJ SOLN
4.0000 mg | INTRAMUSCULAR | Status: DC | PRN
Start: 2013-10-26 — End: 2013-11-01

## 2013-10-26 MED ORDER — METOPROLOL TARTRATE 1 MG/ML IV SOLN: 2.5000 mg | Freq: Four times a day (QID) | INTRAVENOUS | Status: DC

## 2013-10-26 MED ORDER — MEPERIDINE HCL 50 MG/ML IJ SOLN: 6.2500 mg | INTRAMUSCULAR | Status: DC | PRN

## 2013-10-26 MED ORDER — NALOXONE HCL 0.4 MG/ML IJ SOLN: 0.4000 mg | INTRAMUSCULAR | Status: DC | PRN

## 2013-10-26 MED ORDER — HYDROMORPHONE 0.3 MG/ML IV SOLN
INTRAVENOUS | Status: AC
  Administered 2013-10-26: 1.2 mg
  Administered 2013-10-27: 0.9 mg
  Filled 2013-10-26: qty 25

## 2013-10-26 MED ORDER — GLYCOPYRROLATE 0.2 MG/ML IJ SOLN
INTRAMUSCULAR | Status: AC
  Filled 2013-10-26: qty 3

## 2013-10-26 MED ORDER — EPHEDRINE SULFATE 50 MG/ML IJ SOLN
INTRAMUSCULAR | Status: DC | PRN
  Administered 2013-10-26 (×2): 10 mg via INTRAVENOUS

## 2013-10-26 MED ORDER — METOCLOPRAMIDE HCL 5 MG/ML IJ SOLN
INTRAMUSCULAR | Status: AC
  Filled 2013-10-26: qty 2

## 2013-10-26 MED ORDER — SENNOSIDES-DOCUSATE SODIUM 8.6-50 MG PO TABS
2.0000 | ORAL_TABLET | Freq: Every day | ORAL | Status: DC
  Administered 2013-10-28 – 2013-10-31 (×3): 2 via ORAL
  Filled 2013-10-26 (×6): qty 2

## 2013-10-26 MED ORDER — NITROGLYCERIN IN D5W 200-5 MCG/ML-% IV SOLN
2.0000 ug/min | INTRAVENOUS | Status: DC
  Administered 2013-10-27: 20 ug/min via INTRAVENOUS

## 2013-10-26 MED ORDER — PIPERACILLIN-TAZOBACTAM 3.375 G IVPB
3.3750 g | Freq: Three times a day (TID) | INTRAVENOUS | Status: DC
  Administered 2013-10-26 – 2013-10-27 (×2): 3.375 g via INTRAVENOUS
  Filled 2013-10-26 (×2): qty 50

## 2013-10-26 MED ORDER — LACTATED RINGERS IR SOLN
Status: DC | PRN
  Administered 2013-10-26: 1000 mL

## 2013-10-26 MED ORDER — PHENYLEPHRINE HCL 10 MG/ML IJ SOLN
INTRAMUSCULAR | Status: AC
  Filled 2013-10-26: qty 1

## 2013-10-26 MED ORDER — ALVIMOPAN 12 MG PO CAPS
12.0000 mg | ORAL_CAPSULE | Freq: Two times a day (BID) | ORAL | Status: DC
Start: 2013-10-27 — End: 2013-10-26

## 2013-10-26 MED ORDER — LIDOCAINE HCL (CARDIAC) 20 MG/ML IV SOLN
INTRAVENOUS | Status: DC | PRN
  Administered 2013-10-26: 100 mg via INTRAVENOUS

## 2013-10-26 MED ORDER — SODIUM CHLORIDE 0.9 % IV SOLN
10.0000 mg | INTRAVENOUS | Status: DC | PRN
  Administered 2013-10-26: 50 ug/min via INTRAVENOUS

## 2013-10-26 MED ORDER — SODIUM CHLORIDE 0.9 % IJ SOLN: 9.0000 mL | INTRAMUSCULAR | Status: DC | PRN

## 2013-10-26 MED ORDER — KCL IN DEXTROSE-NACL 10-5-0.45 MEQ/L-%-% IV SOLN
INTRAVENOUS | Status: DC
  Administered 2013-10-26 – 2013-10-31 (×7): via INTRAVENOUS
  Filled 2013-10-26 (×11): qty 1000

## 2013-10-26 MED ORDER — FENTANYL CITRATE 0.05 MG/ML IJ SOLN
25.0000 ug | INTRAMUSCULAR | Status: DC | PRN
  Administered 2013-10-26: 25 ug via INTRAVENOUS

## 2013-10-26 MED ORDER — METOPROLOL TARTRATE 1 MG/ML IV SOLN: 5.0000 mg | Freq: Four times a day (QID) | INTRAVENOUS | Status: DC

## 2013-10-26 MED ORDER — BUPIVACAINE 0.25 % ON-Q PUMP SINGLE CATH 300ML
300.0000 mL | INJECTION | Status: DC
  Administered 2013-10-26: 300 mL
  Filled 2013-10-26: qty 300

## 2013-10-26 MED ORDER — PIPERACILLIN-TAZOBACTAM 3.375 G IVPB 30 MIN: 3.3750 g | Freq: Three times a day (TID) | INTRAVENOUS | Status: DC

## 2013-10-26 MED ORDER — SUCCINYLCHOLINE CHLORIDE 20 MG/ML IJ SOLN
INTRAMUSCULAR | Status: DC | PRN
  Administered 2013-10-26: 100 mg via INTRAVENOUS

## 2013-10-26 MED ORDER — PROMETHAZINE HCL 25 MG/ML IJ SOLN: 6.2500 mg | INTRAMUSCULAR | Status: DC | PRN

## 2013-10-26 MED ORDER — NEOSTIGMINE METHYLSULFATE 10 MG/10ML IV SOLN
INTRAVENOUS | Status: AC
  Filled 2013-10-26: qty 1

## 2013-10-26 MED ORDER — PROPOFOL 10 MG/ML IV BOLUS
INTRAVENOUS | Status: DC | PRN
  Administered 2013-10-26: 140 mg via INTRAVENOUS

## 2013-10-26 MED ORDER — LIDOCAINE HCL (CARDIAC) 20 MG/ML IV SOLN
INTRAVENOUS | Status: AC
  Filled 2013-10-26: qty 5

## 2013-10-26 MED ORDER — LACTATED RINGERS IV SOLN
INTRAVENOUS | Status: DC | PRN
  Administered 2013-10-26 (×2): via INTRAVENOUS

## 2013-10-26 MED ORDER — DIPHENHYDRAMINE HCL 50 MG/ML IJ SOLN: 12.5000 mg | Freq: Four times a day (QID) | INTRAMUSCULAR | Status: DC | PRN

## 2013-10-26 MED ORDER — VITAMIN D3 25 MCG (1000 UNIT) PO TABS
1000.0000 [IU] | ORAL_TABLET | Freq: Every day | ORAL | Status: DC
  Administered 2013-10-28 – 2013-10-30 (×3): 1000 [IU] via ORAL
  Filled 2013-10-26 (×8): qty 1

## 2013-10-26 MED ORDER — NITROGLYCERIN IN D5W 200-5 MCG/ML-% IV SOLN
INTRAVENOUS | Status: DC | PRN
  Administered 2013-10-26: 5 ug/min via INTRAVENOUS

## 2013-10-26 MED ORDER — SODIUM CHLORIDE 0.9 % IV SOLN
INTRAVENOUS | Status: DC | PRN
  Administered 2013-10-26: 11:00:00 via INTRAVENOUS

## 2013-10-26 MED ORDER — FENTANYL CITRATE 0.05 MG/ML IJ SOLN
INTRAMUSCULAR | Status: AC
  Filled 2013-10-26: qty 5

## 2013-10-26 MED ORDER — METOCLOPRAMIDE HCL 5 MG/ML IJ SOLN
INTRAMUSCULAR | Status: DC | PRN
  Administered 2013-10-26: 10 mg via INTRAVENOUS

## 2013-10-26 MED ORDER — ONDANSETRON HCL 4 MG/2ML IJ SOLN
INTRAMUSCULAR | Status: AC
  Filled 2013-10-26: qty 2

## 2013-10-26 MED ORDER — DEXAMETHASONE SODIUM PHOSPHATE 10 MG/ML IJ SOLN
INTRAMUSCULAR | Status: AC
  Filled 2013-10-26: qty 1

## 2013-10-26 MED ORDER — LACTATED RINGERS IV SOLN
INTRAVENOUS | Status: DC | PRN
  Administered 2013-10-26 (×2): via INTRAVENOUS

## 2013-10-26 MED ORDER — ONDANSETRON HCL 4 MG/2ML IJ SOLN: 4.0000 mg | Freq: Four times a day (QID) | INTRAMUSCULAR | Status: DC | PRN

## 2013-10-26 MED ORDER — FENTANYL CITRATE 0.05 MG/ML IJ SOLN
INTRAMUSCULAR | Status: DC | PRN
  Administered 2013-10-26 (×4): 25 ug via INTRAVENOUS
  Administered 2013-10-26: 50 ug via INTRAVENOUS

## 2013-10-26 MED ORDER — NITROGLYCERIN IN D5W 200-5 MCG/ML-% IV SOLN
INTRAVENOUS | Status: AC
  Filled 2013-10-26: qty 250

## 2013-10-26 MED ORDER — HYPROMELLOSE (GONIOSCOPIC) 2.5 % OP SOLN: 1.0000 [drp] | Freq: Three times a day (TID) | OPHTHALMIC | Status: DC | PRN

## 2013-10-26 MED ORDER — ROCURONIUM BROMIDE 100 MG/10ML IV SOLN
INTRAVENOUS | Status: AC
  Filled 2013-10-26: qty 1

## 2013-10-26 MED ORDER — DIPHENHYDRAMINE HCL 12.5 MG/5ML PO ELIX: 12.5000 mg | ORAL_SOLUTION | Freq: Four times a day (QID) | ORAL | Status: DC | PRN

## 2013-10-26 MED ORDER — GLYCOPYRROLATE 0.2 MG/ML IJ SOLN
INTRAMUSCULAR | Status: DC | PRN
  Administered 2013-10-26: 0.6 mg via INTRAVENOUS

## 2013-10-26 MED ORDER — DEXAMETHASONE SODIUM PHOSPHATE 10 MG/ML IJ SOLN
INTRAMUSCULAR | Status: DC | PRN
  Administered 2013-10-26: 10 mg via INTRAVENOUS

## 2013-10-26 MED ORDER — METOPROLOL TARTRATE 1 MG/ML IV SOLN
INTRAVENOUS | Status: AC
  Administered 2013-10-26: 2 mg
  Filled 2013-10-26: qty 5

## 2013-10-26 MED ORDER — HYDROMORPHONE 0.3 MG/ML IV SOLN
INTRAVENOUS | Status: DC
  Administered 2013-10-26: 16:00:00 via INTRAVENOUS
  Administered 2013-10-26: 2.4 mg via INTRAVENOUS
  Administered 2013-10-27: 0.9 mg via INTRAVENOUS
  Administered 2013-10-27: 0.6 mg via INTRAVENOUS
  Administered 2013-10-27: 1.39 mg via INTRAVENOUS
  Administered 2013-10-27 (×2): 0.6 mg via INTRAVENOUS
  Administered 2013-10-27: 1.5 mg via INTRAVENOUS
  Administered 2013-10-27: 0.3 mg via INTRAVENOUS
  Administered 2013-10-27: 08:00:00 via INTRAVENOUS
  Administered 2013-10-28: 0.3 mg via INTRAVENOUS
  Administered 2013-10-28: 0.6 mg via INTRAVENOUS
  Administered 2013-10-29: 0.9 mg via INTRAVENOUS
  Administered 2013-10-29 (×2): 0.6 mg via INTRAVENOUS
  Filled 2013-10-26: qty 25

## 2013-10-26 MED ORDER — ROCURONIUM BROMIDE 100 MG/10ML IV SOLN
INTRAVENOUS | Status: DC | PRN
  Administered 2013-10-26: 50 mg via INTRAVENOUS
  Administered 2013-10-26: 5 mg via INTRAVENOUS
  Administered 2013-10-26: 10 mg via INTRAVENOUS
  Administered 2013-10-26: 5 mg via INTRAVENOUS
  Administered 2013-10-26: 10 mg via INTRAVENOUS

## 2013-10-26 MED ORDER — MIDAZOLAM HCL 2 MG/2ML IJ SOLN
INTRAMUSCULAR | Status: AC
  Filled 2013-10-26: qty 2

## 2013-10-26 MED ORDER — ESMOLOL HCL 10 MG/ML IV SOLN
40.0000 mg | Freq: Once | INTRAVENOUS | Status: AC
  Administered 2013-10-26 (×2): 20 mg via INTRAVENOUS

## 2013-10-26 MED ORDER — STERILE WATER FOR IRRIGATION IR SOLN
Status: DC | PRN
  Administered 2013-10-26: 1500 mL

## 2013-10-26 MED ORDER — POLYVINYL ALCOHOL 1.4 % OP SOLN
1.0000 [drp] | Freq: Three times a day (TID) | OPHTHALMIC | Status: DC | PRN
Start: 2013-10-26 — End: 2013-11-01
  Filled 2013-10-26: qty 15

## 2013-10-26 MED ORDER — NEOSTIGMINE METHYLSULFATE 10 MG/10ML IV SOLN
INTRAVENOUS | Status: DC | PRN
  Administered 2013-10-26: 4 mg via INTRAVENOUS

## 2013-10-26 MED ORDER — PANTOPRAZOLE SODIUM 40 MG PO TBEC: 40.0000 mg | DELAYED_RELEASE_TABLET | Freq: Every day | ORAL | Status: DC

## 2013-10-26 MED ORDER — HEPARIN SODIUM (PORCINE) 5000 UNIT/ML IJ SOLN
5000.0000 [IU] | Freq: Three times a day (TID) | INTRAMUSCULAR | Status: DC
  Administered 2013-10-27 – 2013-10-29 (×7): 5000 [IU] via SUBCUTANEOUS
  Filled 2013-10-26 (×7): qty 1

## 2013-10-26 MED ORDER — PROPOFOL 10 MG/ML IV BOLUS
INTRAVENOUS | Status: AC
  Filled 2013-10-26: qty 20

## 2013-10-26 MED ORDER — MIDAZOLAM HCL 5 MG/5ML IJ SOLN
INTRAMUSCULAR | Status: DC | PRN
  Administered 2013-10-26 (×2): 1 mg via INTRAVENOUS

## 2013-10-26 MED ORDER — ONDANSETRON HCL 4 MG/2ML IJ SOLN
INTRAMUSCULAR | Status: DC | PRN
  Administered 2013-10-26 (×2): 4 mg via INTRAVENOUS

## 2013-10-26 MED ORDER — PIPERACILLIN-TAZOBACTAM IN DEX 2-0.25 GM/50ML IV SOLN
2.2500 g | Freq: Once | INTRAVENOUS | Status: AC
  Administered 2013-10-26: 2.25 g via INTRAVENOUS
  Filled 2013-10-26: qty 50

## 2013-10-26 MED ORDER — SODIUM CHLORIDE 0.9 % IR SOLN
Status: DC | PRN
  Administered 2013-10-26: 3000 mL

## 2013-10-26 SURGICAL SUPPLY — 104 items
ADH SKN CLS APL DERMABOND .7 (GAUZE/BANDAGES/DRESSINGS) ×1
APL ESCP 34 STRL LF DISP (HEMOSTASIS)
APPLICATOR SURGIFLO ENDO (HEMOSTASIS) IMPLANT
BAG SPEC RTRVL LRG 6X4 10 (ENDOMECHANICALS)
BAG URINE DRAINAGE (UROLOGICAL SUPPLIES) IMPLANT
BAG URO CATCHER STRL LF (DRAPE) ×3 IMPLANT
BLADE SURG SZ10 CARB STEEL (BLADE) IMPLANT
CABLE HIGH FREQUENCY MONO STRZ (ELECTRODE) ×1 IMPLANT
CANISTER SUCTION 2500CC (MISCELLANEOUS) ×1 IMPLANT
CATH FOLEY 2WAY SLVR 18FR 30CC (CATHETERS) ×3 IMPLANT
CATH KIT ON Q 7.5IN SLV (PAIN MANAGEMENT) ×2 IMPLANT
CATH ROBINSON RED A/P 16FR (CATHETERS) ×3 IMPLANT
CHLORAPREP W/TINT 26ML (MISCELLANEOUS) ×3 IMPLANT
CLIP LIGATING HEM O LOK PURPLE (MISCELLANEOUS) ×8 IMPLANT
CLIP LIGATING HEMO LOK XL GOLD (MISCELLANEOUS) ×5 IMPLANT
CLOTH BEACON ORANGE TIMEOUT ST (SAFETY) ×3 IMPLANT
COVER MAYO STAND STRL (DRAPES) ×3 IMPLANT
COVER SURGICAL LIGHT HANDLE (MISCELLANEOUS) ×1 IMPLANT
COVER TIP SHEARS 8 DVNC (MISCELLANEOUS) ×1 IMPLANT
COVER TIP SHEARS 8MM DA VINCI (MISCELLANEOUS) ×2
DECANTER SPIKE VIAL GLASS SM (MISCELLANEOUS) ×1 IMPLANT
DERMABOND ADVANCED (GAUZE/BANDAGES/DRESSINGS) ×2
DERMABOND ADVANCED .7 DNX12 (GAUZE/BANDAGES/DRESSINGS) ×2 IMPLANT
DRAPE ARM DVNC X/XI (DISPOSABLE) IMPLANT
DRAPE CAMERA CLOSED 9X96 (DRAPES) ×3 IMPLANT
DRAPE COLUMN DVNC XI (DISPOSABLE) IMPLANT
DRAPE DA VINCI XI ARM (DISPOSABLE) ×8
DRAPE DA VINCI XI COLUMN (DISPOSABLE) ×2
DRAPE LAPAROSCOPIC ABDOMINAL (DRAPES) ×2 IMPLANT
DRAPE UTILITY XL STRL (DRAPES) ×2 IMPLANT
DRAPE WARM FLUID 44X44 (DRAPE) ×1 IMPLANT
DRSG TEGADERM 4X4.75 (GAUZE/BANDAGES/DRESSINGS) ×2 IMPLANT
DRSG TEGADERM 6X8 (GAUZE/BANDAGES/DRESSINGS) ×6 IMPLANT
DRSG VASELINE 3X18 (GAUZE/BANDAGES/DRESSINGS) ×2 IMPLANT
ELECT CAUTERY BLADE 6.4 (BLADE) ×3 IMPLANT
ELECT REM PT RETURN 9FT ADLT (ELECTROSURGICAL) ×3
ELECTRODE REM PT RTRN 9FT ADLT (ELECTROSURGICAL) ×1 IMPLANT
GAUZE SPONGE 2X2 8PLY STRL LF (GAUZE/BANDAGES/DRESSINGS) IMPLANT
GLOVE BIO SURGEON STRL SZ 6.5 (GLOVE) ×5 IMPLANT
GLOVE BIO SURGEONS STRL SZ 6.5 (GLOVE) ×3
GLOVE BIOGEL M STRL SZ7.5 (GLOVE) ×9 IMPLANT
GOWN STRL REUS W/TWL LRG LVL3 (GOWN DISPOSABLE) ×15 IMPLANT
KIT ACCESSORY DA VINCI DISP (KITS)
KIT ACCESSORY DVNC DISP (KITS) ×1 IMPLANT
KIT PROCEDURE DA VINCI SI (MISCELLANEOUS) ×2
KIT PROCEDURE DVNC SI (MISCELLANEOUS) ×1 IMPLANT
LOOP MINI RED (MISCELLANEOUS) ×3 IMPLANT
LOOP VESSEL MAXI BLUE (MISCELLANEOUS) ×3 IMPLANT
MANIFOLD NEPTUNE II (INSTRUMENTS) ×3 IMPLANT
NDL INSUFFLATION 14GA 120MM (NEEDLE) ×1 IMPLANT
NEEDLE INSUFFLATION 14GA 120MM (NEEDLE) ×3 IMPLANT
PACK CYSTO (CUSTOM PROCEDURE TRAY) ×3 IMPLANT
PACK ROBOT UROLOGY CUSTOM (CUSTOM PROCEDURE TRAY) ×3 IMPLANT
POSITIONER SURGICAL ARM (MISCELLANEOUS) ×2 IMPLANT
POUCH ENDO CATCH II 15MM (MISCELLANEOUS) ×3 IMPLANT
POUCH SPECIMEN RETRIEVAL 10MM (ENDOMECHANICALS) IMPLANT
PUMP PAIN ON-Q (MISCELLANEOUS) ×1 IMPLANT
RELOAD STAPLE 60 2.6 WHT THN (STAPLE) ×2 IMPLANT
RELOAD STAPLE 60 3.8 GOLD REG (STAPLE) ×1 IMPLANT
RELOAD STAPLE 60 4.1 GRN THCK (STAPLE) ×1 IMPLANT
RELOAD STAPLER GOLD 60MM (STAPLE) IMPLANT
RELOAD STAPLER GREEN 60MM (STAPLE) ×5 IMPLANT
RELOAD STAPLER WHITE 60MM (STAPLE) ×6 IMPLANT
RELOAD WHITE ECR60W (STAPLE) ×4 IMPLANT
SEAL CANN UNIV 5-8 DVNC XI (MISCELLANEOUS) IMPLANT
SEAL XI 5MM-8MM UNIVERSAL (MISCELLANEOUS) ×8
SET TUBE IRRIG SUCTION NO TIP (IRRIGATION / IRRIGATOR) ×3 IMPLANT
SOLUTION ELECTROLUBE (MISCELLANEOUS) ×3 IMPLANT
SPONGE GAUZE 2X2 STER 10/PKG (GAUZE/BANDAGES/DRESSINGS) ×2
SPONGE LAP 18X18 X RAY DECT (DISPOSABLE) ×6 IMPLANT
SPONGE LAP 4X18 X RAY DECT (DISPOSABLE) ×3 IMPLANT
STAPLE ECHEON FLEX 60 POW ENDO (STAPLE) ×1 IMPLANT
STAPLER RELOAD GOLD 60MM (STAPLE)
STAPLER RELOAD GREEN 60MM (STAPLE) ×15
STAPLER RELOAD WHITE 60MM (STAPLE) ×18
STENT SET URETHERAL LEFT 7FR (STENTS) ×3 IMPLANT
STENT SET URETHERAL RIGHT 7FR (STENTS) ×3 IMPLANT
SURGIFLO W/THROMBIN 8M KIT (HEMOSTASIS) IMPLANT
SUT CHROMIC 4 0 RB 1X27 (SUTURE) ×7 IMPLANT
SUT ETHILON 3 0 PS 1 (SUTURE) ×3 IMPLANT
SUT MNCRL AB 4-0 PS2 18 (SUTURE) ×6 IMPLANT
SUT PDS AB 0 CTX 36 PDP370T (SUTURE) ×13 IMPLANT
SUT SILK 3 0 SH 30 (SUTURE) IMPLANT
SUT SILK 3 0 SH CR/8 (SUTURE) ×5 IMPLANT
SUT VIC AB 2-0 UR5 27 (SUTURE) ×14 IMPLANT
SUT VIC AB 3-0 SH 27 (SUTURE) ×18
SUT VIC AB 3-0 SH 27X BRD (SUTURE) ×1 IMPLANT
SUT VIC AB 3-0 SH 27XBRD (SUTURE) ×1 IMPLANT
SUT VIC AB 4-0 PS2 18 (SUTURE) ×6 IMPLANT
SUT VIC AB 4-0 RB1 27 (SUTURE) ×15
SUT VIC AB 4-0 RB1 27XBRD (SUTURE) ×4 IMPLANT
SUT VLOC BARB 180 ABS3/0GR12 (SUTURE) ×3
SUTURE VLOC BRB 180 ABS3/0GR12 (SUTURE) ×1 IMPLANT
SYRINGE IRR TOOMEY STRL 70CC (SYRINGE) ×2 IMPLANT
SYSTEM UROSTOMY GENTLE TOUCH (WOUND CARE) ×3 IMPLANT
TOWEL OR NON WOVEN STRL DISP B (DISPOSABLE) ×3 IMPLANT
TROCAR 12M 150ML BLUNT (TROCAR) ×3 IMPLANT
TROCAR BLADELESS 15MM (ENDOMECHANICALS) ×3 IMPLANT
TUBING CONNECTING 10 (TUBING) ×2 IMPLANT
TUBING CONNECTING 10' (TUBING) ×1
URINEMETER 200ML W/220 (MISCELLANEOUS) ×1 IMPLANT
WATER STERILE IRR 1000ML UROMA (IV SOLUTION) ×1 IMPLANT
WATER STERILE IRR 1500ML POUR (IV SOLUTION) ×2 IMPLANT
YANKAUER SUCT BULB TIP 10FT TU (MISCELLANEOUS) ×2 IMPLANT

## 2013-10-26 NOTE — Progress Notes (Signed)
Day of Surgery  Subjective:   1 - Muscle-Invasive High-Grade Bladder Cancer - Pt with T2G3 high-grade urothelial carcinoma by TURBT + TURP 09/03/13 by Dr. Roby Lofts. Recent CT and CXR suggests clinically localized. NO prior abd surgeries. Having problematic hematuria off / on for several weeks.   2 - Chronic Renal Insufficiency - baseline Cr 1.6 range x several years. No sig hydro on recent imaging.    Today [name] is seen feeling good. Completed bowel prep to clear. Hgb 9.9 and blood available intra-operatively today. Stomal marking complete.    Objective: Vital signs in last 24 hours: Temp:  [97.4 F (36.3 C)-98.1 F (36.7 C)] 98.1 F (36.7 C) (07/24 0610) Pulse Rate:  [54-72] 72 (07/24 0610) Resp:  [16-19] 18 (07/24 0610) BP: (151-170)/(69-128) 156/87 mmHg (07/24 0610) SpO2:  [98 %-100 %] 100 % (07/24 0610) Weight:  [74.118 kg (163 lb 6.4 oz)] 74.118 kg (163 lb 6.4 oz) (07/23 1121) Last BM Date: 10/25/13  Intake/Output from previous day: 07/23 0701 - 07/24 0700 In: 69 [P.O.:420; I.V.:300] Out: 2119 [Urine:3175] Intake/Output this shift: Total I/O In: 390 [I.V.:300; Other:90] Out: 2075 [Urine:2075]  General appearance: alert, cooperative, appears stated age and family at bedside Head: Normocephalic, without obvious abnormality, atraumatic, wearing glasses Throat: lips, mucosa, and tongue normal; teeth and gums normal Neck: supple, symmetrical, trachea midline Back: symmetric, no curvature. ROM normal. No CVA tenderness. Resp: non-labored on room air Chest wall: no tenderness Cardio: Nl rate GI: soft, non-tender; bowel sounds normal; no masses,  no organomegaly Male genitalia: normal, foley c/d/i with grossly bloody urine, no clots Extremities: extremities normal, atraumatic, no cyanosis or edema Pulses: 2+ and symmetric Skin: Skin color, texture, turgor normal. No rashes or lesions Lymph nodes: Cervical, supraclavicular, and axillary nodes normal. Neurologic: Grossly  normal  Lab Results:   Recent Labs  10/25/13 1250  WBC 7.2  HGB 9.9*  HCT 30.3*  PLT 417*   BMET  Recent Labs  10/25/13 1250  NA 135*  K 5.0  CL 97  CO2 25  GLUCOSE 114*  BUN 19  CREATININE 1.46*  CALCIUM 9.6   PT/INR No results found for this basename: LABPROT, INR,  in the last 72 hours ABG No results found for this basename: PHART, PCO2, PO2, HCO3,  in the last 72 hours  Studies/Results: No results found.  Anti-infectives: Anti-infectives   Start     Dose/Rate Route Frequency Ordered Stop   10/25/13 1215  piperacillin-tazobactam (ZOSYN) IVPB 2.25 g     2.25 g 100 mL/hr over 30 Minutes Intravenous  Once 10/25/13 1209 10/25/13 1505   10/25/13 1200  piperacillin-tazobactam (ZOSYN) IVPB 3.375 g  Status:  Discontinued    Comments:  Dr. Tresa Moore requests dosage be 2.25g IV x1   3.375 g 100 mL/hr over 30 Minutes Intravenous  Once 10/25/13 1156 10/25/13 1505      Assessment/Plan:  1 - Muscle-Invasive High-Grade Bladder Cancer - Proceed as planned today with robotic cystectomy and ileal conduit. Risks and benefits discussed extensively previously and re-iterated today.   2 - Chronic Renal Insufficiency - Cr stable, now approx 1.5.   [facility], Vineet Kinney 10/26/2013
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
## 2013-10-26 NOTE — Anesthesia Postprocedure Evaluation (Signed)
  Anesthesia Post-op Note  Patient: [name].  Procedure(s) Performed: Procedure(s) (LRB): ROBOTIC ASSISTED LAPAROSCOPIC CYSTECTOMY AND PROSTECTOMY WITH NODE DISSECTION,  ILEAL CONDUIT, INSERTION OF FACIAL PAIN PUMP (N/A) CYSTOSCOPY WITH CLOT EVACUATION , INDOCYANINE GREEN DYE INJECTION (N/A)  Patient Location: PACU  Anesthesia Type: General  Level of Consciousness: awake and alert   Airway and Oxygen Therapy: Patient Spontanous Breathing  Post-op Pain: mild  Post-op Assessment: Post-op Vital signs reviewed, Patient's Cardiovascular Status Stable, Respiratory Function Stable, Patent Airway and No signs of Nausea or vomiting  Last Vitals:  Filed Vitals:   10/26/13 1545  BP:   Pulse: 61  Temp: 36.4 C  Resp: 22    Post-op Vital Signs: stable. ST segment depression   Complications: ST depressions early on intra-op. Resolved with PRBC's and NTG infusion. St depression again in PACU, responded to increase in NTG and beta blocker. EKG's in chart. Dr. Tresa Moore aware. To ICU. Cardiac enzymes and labs pending. Pt denies CP/SOB. Stable. Known history of CAD and ischemia

## 2013-10-26 NOTE — Anesthesia Preprocedure Evaluation (Addendum)
Anesthesia Evaluation  Patient identified by MRN, date of birth, ID band Patient awake    Reviewed: Allergy & Precautions, H&P , NPO status , Patient's Chart, lab work & pertinent test results  History of Anesthesia Complications (+) PONV  Airway Mallampati: II TM Distance: >3 FB Neck ROM: Full    Dental no notable dental hx.    Pulmonary former smoker,  breath sounds clear to auscultation  Pulmonary exam normal       Cardiovascular hypertension, Pt. on medications + CAD, + CABG (1983) and + Peripheral Vascular Disease (Bil carotid artery stenosis) Rhythm:Regular Rate:Normal     Neuro/Psych negative neurological ROS  negative psych ROS   GI/Hepatic Neg liver ROS, hiatal hernia, GERD-  Medicated and Controlled,  Endo/Other  negative endocrine ROS  Renal/GU negative Renal ROS  negative genitourinary   Musculoskeletal negative musculoskeletal ROS (+)   Abdominal   Peds negative pediatric ROS (+)  Hematology negative hematology ROS (+)   Anesthesia Other Findings   Reproductive/Obstetrics negative OB ROS                        Anesthesia Physical  Anesthesia Plan  ASA: III  Anesthesia Plan: General   Post-op Pain Management:    Induction: Intravenous  Airway Management Planned: Oral ETT  Additional Equipment: Arterial line  Intra-op Plan:   Post-operative Plan: Possible Post-op intubation/ventilation  Informed Consent: I have reviewed the patients History and Physical, chart, labs and discussed the procedure including the risks, benefits and alternatives for the proposed anesthesia with the patient or authorized representative who has indicated his/her understanding and acceptance.   Dental advisory given  Plan Discussed with: CRNA  Anesthesia Plan Comments: (Will attempt to keep tight BP control. Recommend MAP >80 at all times.  Increased risk of stroke/blindness/cardiac and  resp complications because of positioning necessary during surgery and concomitant comorbidities discussed at length.[name])      Anesthesia Quick Evaluation

## 2013-10-26 NOTE — Transfer of Care (Signed)
Immediate Anesthesia Transfer of Care Note  Patient: [name].  Procedure(s) Performed: Procedure(s): ROBOTIC ASSISTED LAPAROSCOPIC CYSTECTOMY AND PROSTECTOMY WITH NODE DISSECTION,  ILEAL CONDUIT, INSERTION OF FACIAL PAIN PUMP (N/A) CYSTOSCOPY WITH CLOT EVACUATION , INDOCYANINE GREEN DYE INJECTION (N/A)  Patient Location: PACU  Anesthesia Type:General  Level of Consciousness: sedated  Airway & Oxygen Therapy: Patient Spontanous Breathing and Patient connected to face mask oxygen  Post-op Assessment: Report given to PACU RN and Post -op Vital signs reviewed and stable  Post vital signs: Reviewed and stable  Complications: No apparent anesthesia complications
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
## 2013-10-26 NOTE — Progress Notes (Signed)
S:  POD 0 s/p robotic cystectomy with ileal conduit. Had some ST segment depression intra/op post-op, cardiology consultation pending. Critical care consultaiton pending. Denies CP, SOB.  O: NAD, family at bedside Mild facial edema as expected post-op SNTND. JP with serosanguinous output. ON-Q in place. RLQ urostomy pink / patent with clear urine RRR by bedside monitor, SBP 140s No LE edema SCD's in place  Hgb post-op 9.5 TropI negative x1  A/P  Stable s/p major surgery. Proceed with cards and critical care eval. Continue NTG gtt for now pending their opinion. Explained to pt and family typical post-op course and goals for DC. Dr. Gaynelle Arabian and Glo Herring rounding this weekend but I am available if urgent needs arise anytime.

## 2013-10-26 NOTE — Brief Op Note (Signed)
10/25/2013 - 10/26/2013  3:13 PM  PATIENT:  [name].  81 y.o. male  PRE-OPERATIVE DIAGNOSIS:  BLADDER CANCER AND REFRACTORY HEMATURIA  POST-OPERATIVE DIAGNOSIS:  BLADDER CANCER AND REFRACTORY HEMATURIA  PROCEDURE:  Procedure(s): ROBOTIC ASSISTED LAPAROSCOPIC CYSTECTOMY AND PROSTECTOMY WITH NODE DISSECTION,  ILEAL CONDUIT, INSERTION OF FACIAL PAIN PUMP (N/A) CYSTOSCOPY WITH CLOT EVACUATION , INDOCYANINE GREEN DYE INJECTION (N/A)  SURGEON:  Surgeon(s) and Role:    * Alexis Frock, MD - Primary  PHYSICIAN ASSISTANT:   ASSISTANTS: Felipa Furnace, PA, Charlton Haws MD   ANESTHESIA:   local and general  EBL:  Total I/O In: 1448 [I.V.:3050; Blood:335] Out: 550 [Urine:400; Blood:150]  BLOOD ADMINISTERED:1 unit CC PRBC  DRAINS: 1 - JP to bulb suction, 2 - Urostomy with right (red) and left (blue) bander stents. 3 - ON-Q pain pump   LOCAL MEDICATIONS USED:  MARCAINE     SPECIMEN:  Source of Specimen:  1 - pelvic lymph nodes, 2 - ureteral margins, 3 - cystoprostatectomy  DISPOSITION OF SPECIMEN:  PATHOLOGY  COUNTS:  YES  TOURNIQUET:  * No tourniquets in log *  DICTATION: .Other Dictation: Dictation Number [id number]  PLAN OF CARE: Admit to inpatient   PATIENT DISPOSITION:  PACU - hemodynamically stable.   Delay start of Pharmacological VTE agent (>24hrs) due to surgical blood loss or risk of bleeding: no

## 2013-10-26 NOTE — Progress Notes (Signed)
Pharmacy - Brief Note regarding Alvimopan (Entereg)  The pre-op dose of Entereg was NOT ordered nor administered thus disqualifies patient from receiving post-op doses.  The post-op doses of Entereg have been discontinued per policy.  Thanks,   Doreene Eland, PharmD, BCPS.   Pager: [phone] 10/26/2013 5:34 PM

## 2013-10-26 NOTE — Anesthesia Procedure Notes (Signed)
Procedures

## 2013-10-27 DIAGNOSIS — C679 Malignant neoplasm of bladder, unspecified: Principal | ICD-10-CM

## 2013-10-27 DIAGNOSIS — R9431 Abnormal electrocardiogram [ECG] [EKG]: Secondary | ICD-10-CM

## 2013-10-27 DIAGNOSIS — I359 Nonrheumatic aortic valve disorder, unspecified: Secondary | ICD-10-CM

## 2013-10-27 LAB — BASIC METABOLIC PANEL
Anion gap: 11 (ref 5–15)
BUN: 17 mg/dL (ref 6–23)
CO2: 21 mEq/L (ref 19–32)
Calcium: 8.1 mg/dL — ABNORMAL LOW (ref 8.4–10.5)
Chloride: 104 mEq/L (ref 96–112)
Creatinine, Ser: 1.58 mg/dL — ABNORMAL HIGH (ref 0.50–1.35)
GFR calc Af Amer: 46 mL/min — ABNORMAL LOW (ref >90)
GFR calc non Af Amer: 40 mL/min — ABNORMAL LOW (ref >90)
Glucose, Bld: 143 mg/dL — ABNORMAL HIGH (ref 70–99)
POTASSIUM: 4.8 meq/L (ref 3.7–5.3)
Sodium: 136 mEq/L — ABNORMAL LOW (ref 137–147)

## 2013-10-27 LAB — CBC
HCT: 25.4 % — ABNORMAL LOW (ref 39.0–52.0)
HEMOGLOBIN: 8.4 g/dL — AB (ref 13.0–17.0)
MCH: 27.3 pg (ref 26.0–34.0)
MCHC: 33.1 g/dL (ref 30.0–36.0)
MCV: 82.5 fL (ref 78.0–100.0)
Platelets: 329 10*3/uL (ref 150–400)
RBC: 3.08 MIL/uL — ABNORMAL LOW (ref 4.22–5.81)
RDW: 14.8 % (ref 11.5–15.5)
WBC: 10.4 10*3/uL (ref 4.0–10.5)

## 2013-10-27 LAB — MAGNESIUM: Magnesium: 1.6 mg/dL (ref 1.5–2.5)

## 2013-10-27 LAB — TROPONIN I: Troponin I: <0.3 ng/mL (ref <0.30)

## 2013-10-27 MED ORDER — METOPROLOL TARTRATE 12.5 MG HALF TABLET
12.5000 mg | ORAL_TABLET | Freq: Two times a day (BID) | ORAL | Status: DC
  Administered 2013-10-27: 12.5 mg via ORAL
  Filled 2013-10-27: qty 1

## 2013-10-27 MED ORDER — PANTOPRAZOLE SODIUM 40 MG IV SOLR
40.0000 mg | Freq: Every day | INTRAVENOUS | Status: DC
  Administered 2013-10-27: 40 mg via INTRAVENOUS
  Filled 2013-10-27: qty 40

## 2013-10-27 MED ORDER — ASPIRIN 81 MG PO CHEW
81.0000 mg | CHEWABLE_TABLET | Freq: Every day | ORAL | Status: DC
  Administered 2013-10-27 – 2013-10-31 (×5): 81 mg via ORAL
  Filled 2013-10-27 (×6): qty 1

## 2013-10-27 MED ORDER — METOPROLOL TARTRATE 1 MG/ML IV SOLN
5.0000 mg | Freq: Four times a day (QID) | INTRAVENOUS | Status: DC
  Administered 2013-10-27: 5 mg via INTRAVENOUS
  Filled 2013-10-27 (×3): qty 5

## 2013-10-27 MED ORDER — PIPERACILLIN-TAZOBACTAM IN DEX 2-0.25 GM/50ML IV SOLN: 2.2500 g | Freq: Three times a day (TID) | INTRAVENOUS | Status: DC

## 2013-10-27 MED ORDER — PIPERACILLIN-TAZOBACTAM 3.375 G IVPB: 3.3750 g | Freq: Three times a day (TID) | INTRAVENOUS | Status: DC

## 2013-10-27 MED ORDER — PIPERACILLIN-TAZOBACTAM IN DEX 2-0.25 GM/50ML IV SOLN
2.2500 g | Freq: Three times a day (TID) | INTRAVENOUS | Status: DC
  Administered 2013-10-27 – 2013-10-29 (×6): 2.25 g via INTRAVENOUS
  Filled 2013-10-27 (×6): qty 50

## 2013-10-27 MED ORDER — NON FORMULARY: 10.0000 mg | Freq: Every morning | Status: DC

## 2013-10-27 MED ORDER — SODIUM CHLORIDE 0.9 % IV BOLUS (SEPSIS)
500.0000 mL | Freq: Once | INTRAVENOUS | Status: AC
  Administered 2013-10-27: 500 mL via INTRAVENOUS

## 2013-10-27 MED ORDER — ROSUVASTATIN CALCIUM 10 MG PO TABS
10.0000 mg | ORAL_TABLET | Freq: Every day | ORAL | Status: DC
  Administered 2013-10-27 – 2013-10-31 (×5): 10 mg via ORAL
  Filled 2013-10-27 (×7): qty 1

## 2013-10-27 NOTE — Progress Notes (Signed)
The patient had an episode of angina pectoris earlier today.  He had precordial chest discomfort with radiation down the right arm.  He had stopped all of his cardiac medications prior to surgery.  He had stopped his Crestor and his lisinopril and his baby aspirin. Today he responded to restarting his IV nitroglycerin with resolution of his chest pain.  Serial troponins have been ordered.  Presently he is pain free and feels back to baseline.  I will restart daily aspirin and restart his Crestor.  Earlier today we restarted a beta blocker.  Echocardiogram has been done and results are pending.  We will keep in the unit today.

## 2013-10-27 NOTE — Progress Notes (Signed)
OT Cancellation Note  Patient Details Name: [name]. MRN: [id number] DOB: 1932/02/17   Cancelled Treatment:    Reason Eval/Treat Not Completed: Other (comment) See from PT note that nursing requests to hold on therapy this date. Will check back for OT at a later time.  Jules Schick [phone] 10/27/2013, 2:42 PM

## 2013-10-27 NOTE — Progress Notes (Signed)
  Echocardiogram 2D Echocardiogram has been performed.  Darlina Sicilian M 10/27/2013, 10:17 AM

## 2013-10-27 NOTE — Op Note (Signed)
NAMECELESTE, CANDELAS NO.:  [id number]  MEDICAL RECORD NO.:  [id number]  LOCATION:  37                         FACILITY:  [facility]  PHYSICIAN:  Alexis Frock, MD     DATE OF BIRTH:  May 18, 1931  DATE OF PROCEDURE: 10/26/2013 DATE OF DISCHARGE:                              OPERATIVE REPORT   DIAGNOSES:  Muscle invasive bladder cancer, refractory hematuria.  PROCEDURE: 1. Robotic-assisted laparoscopic radical cystoprostatectomy with ileal     conduit urinary diversion and bilateral pelvic lymphadenectomy. 2. Cystoscopy with clot evacuation. 3. Cystoscopic injection of indocyanine green dye forand sentinel lymphangiography.  ASSISTANT: 1. Leta Baptist, PA. 2. Charlton Haws, M.D.  ESTIMATED BLOOD LOSS:  100 mL.  COMPLICATIONS:  None.  DRAINS: 1. Right lower quadrant urostomy with bander stents in situ, right is     red, left is blue. 2. Jackson-Pratt drain bulb suction. 3. ON-Q pain catheter exiting the inferior most extraction site.  INDICATION:  [name] is a very vigorous 80 year old gentleman with history of ischemic heart disease, who was found  to have muscle invasive high-grade bladder cancer by transurethral resection recently.  He underwent staging imaging that was negative for distant disease.  He unfortunately has had refractory hematuria, having had transfusion times several, and being a catheter dependent with several hospitalizations for this alone.  He is referred for consideration of cystectomy for definitive management as well as palliative indications.  He was felt to be a suitable candidate.  He had appropriate cardiac clearance and wished to proceed.  Informed consent was obtained and placed in the medical record.  He was admitted preoperatively for labs and stoma marking, bowel prep which tolerated well.  PROCEDURE IN DETAIL:  The patient being [name], procedure being robotic cystectomy with cystoscopy and administration was  confirmed, procedure was carried out.  Time-out was performed.  Intravenous antibiotics were administered.  General endotracheal anesthesia was reduced.  The patient placed into low lithotomy position.  Sterile field was created by prepping the patient's penis, perineum, and proximal thighs using iodine x3.  The arterial line was placed by anesthesia personnel as were multiple peripheral lines.  Next, cystourethroscopy was performed using 22-French rigid cystoscope with 12-degree offset lens.  Inspection of the anterior and posterior urethra revealed prior transurethral resection of prostate defect.  There was sessile-appearing tumor mostly in the left trigone area with obliteration of the left ureteral orifice.  As expected, there was a large volume of formed clot in the urinary bladder.  To decrease the size of the specimen, was irrigated out with a Toomey syringe and performed clot evacuation, approximately 300 mL of formed clot was irrigated in this fashion. Next, using a cystoscopic injection syringe approximately 3 mL of indocyanine green dye was injected into submucosal fashion circumferentially around the tumor edges using approximately 6 injection sites for sentinel lymphangiography.  The patient was then repositioned into a supine position and completely re-prepped of his infra-xiphoid, abdomen using chlorhexidine gluconate and his penis, perineum, proximal thighs using iodine.  After a small rectal Foley was placed, he was furhter fashioned to the operative table using 3 inch tape over foam across his  chest.  The test of T-berg positioing  was performed and found to be suitably positioned.  Next, high-flow low pressure pneumoperitoneum was obtained using Veress technique in the supraumbilical midline having passed the aspiration and drop test. Next, 12-mm robotic camera port was placed in the same location. Laparoscopic examination of the peritoneal cavity revealed small  lesions in the sigmoid and the pelvis revealed to be thin and relatively noncomplex.  Additional ports have been placed as follows; right far lateral 12-mm assistant port, right paramedian 8-mm robotic port, right paramedian 15-mm assistant port at previously marked conduit site.  Left paramedian 8-mm robotic port, left far lateral 8-mm robotic port.  Robot was docked and passed through electronic checks.  Attention was directed at the limited adhesiolysis, loose peritoneal adhesions were taken down between redundant sigmoid and left and right pelvic walls close to the inguinal rings.  This moved the structure out of the way atraumatically. Next, attention was directed to identification of the ureters.  First on the left side, incision was made lateral to the descending colon from the area close to the internal ring superiorly and distally towards the area of the urinary bladder.  The left ureter was seen as was coursing over the iliac vessels and it was very was carefully circumferentially mobilized, taking great care to avoid excessive skeletonization to the area of the bladder hiatus was doubly clipped and ligated with the proximal clip being marked with dyed tag.  Frozen section negative for carcinoma.  The ureter was mobilized superiorly for total distance approximately 7 cm above the area of the iliac crossing.  This was set aside.  A mirror image dissection was performed on the right side.  As such, the right ureter to the area of the bladder hiatus and superiorly distance approximately 4 cm above the iliac crossing, and this was tagged with an undyed Vicryl suture and frozen section negative for carcinoma. Following these maneuvers, the pelvis was interrogated with a near infrared fluorescence and sentinel lymph angiography was performed.  Interrogation of the pelvis under near infrared light revealed hyperfluorescent lymph nodes in the left hemipelvis corresponding to the areas  of the left external and internal obturator areas.  Attention was then directed at pelvic lymphadenectomy.  First on the left side All fibro-fatty within the confines of the external iliac artery and vein pelvic side wall.  The iliac bifurcation was carefully mobilized.  Lymphostasis was achieved with cold clips.  This set aside, labeled as left external iliac lymph node, sentinel.  Next, fiber fatty tissue in the confines the external iliac vein, obturator nerve, and pelvic side wall carefully mobilized.  Lymphostasis achieved with cold clips.  As such, the left obturator lymph node, sentinel, fiber fatty tissue overlying the area of the internal iliac vessels lateral to the obturator nerve mobilized. Lymphostasis was achieved with cold clips set aside labeled left internal iliac lymph node, sentinel, and finally fiber fatty tissue overlying the confines of the left common iliac artery in the area of the iliac bifurcation towards the area of the aortic bifurcation was set aside to the level of common iliac lymph nodes.  Following these maneuvers, hemostasis appeared excellent.  The lymphadenectomy bed and the obturator nerve inspected and found to be completely intact.  Next, mirror image lymphadenectomy was performed on the right side.  Again with the packets being right external iliac, right obturator, right common iliac, right internal iliac lymph nodes all sent separately. There were no sentinel areas on the  right.  The obturator nerve was inspected and found to be intact on the right side as well.  Additional lateral dissection was performed at the level of the endopelvic fascia which was carefully swept away from the prostate and pelvic sidewall towards the area of the apex of the prostate.  Next, posterior dissection was performed by connecting the previous lateral peritoneal incisions,  And developing posterior peritoneal flap, which was carefully mobilized towards the area of the  prostatic apex.  The vas deferens was encountered as were the seminal vesicles and kept superior with the bladder specimen.  This exposed the vascular pedicles of the bladder and prostate.  The bladder pedicles were controlled using 60 mm vascular stapler x1 each side, similarly the prostate pedicles were controlled using vascular stapler x1 each side, taking great care to avoid rectal injury; this did not previously occur.  Anterior attachments were then taken down, clearly developing the space of Retzius which exposed the dorsal venous complex, which was controlled using vascular load stapler, membranous urethra was then transected.  Cold clip placed on the catheter with the bladder specimen.  This completely freed up the cystoprostatectomy specimen was placed into an extra large endoscopic retrieval bag and set aside.  Inspection of pelvis revealed complete hemostasis with no evidence of rectal injury.  The pelvis was irrigated and pulsations of the rectal balloon revealed no evidence of perforation. Membranous urethral stump was closed using 3-0 Vicryl to prevent postop drainage in this location.  Next, the left ureter was retro- peritonealized and brought underneath the sigmoid mesentery to the right side, and both ureteral tags were clipped and marked with an external laparoscopic grasper coming to the future conduit site and the specimen bag was tagged and brought through port, was previous left paramedian.  Robot was then undocked.  Specimens retrieved by extending the previous camera port site inferiorly for total distance approximately 8 cm, removing the cystic prostatectomy specimen, setting aside for permanent pathology.  The Omni-Tract self-retaining retractor was then carefully deployed in this location.  The bowel packed away thus exposing the area of the previously dissected distal ureters and the ileocecal valve which was identified.  Next, the segment of the terminal  ileum 15 cm in length, 15 cm proximal to the ileocecal valve was taken out of continuity using bowel load stapler.  Additional mesenteric mobilization was performed using a 60 mm vascular stapler in the mesentery taken great care to avoid devascularization of the conduit or anastomotic segments, and this did not occur.  The conduit segment was retro-peritonealized and  brought back in continuity using side-to-side bowel anastomosis with 60 mm bowel load x2 with the suture repair with the staple line being buttressed with interrupted silk.  The free end was oversewn using running silk x2.  Second layer being an imbricating layer.  The trapdoor and mesentery was closed using interrupted silk.  The bowel anastomosis was widely patent by palpation and visibly viable.  Attention directed at ureteral ileal anastomosis first on the left side.  A small segment of the proximal conduit was incised, 7 mm of Potts  scissors at the level of the mucosa, this was then either allowing visualization circumferentially of the bowel mucosa.  Next, ureter was spatulated and heel stitch was applied of 4-0 Vicryl followed by bander type stent, a 25 cm to the ureteroileal anastomosis.  ureteroenteric anastomosis was then completed using 2 separate suture lines of running 4-0 Vicryl ensuring mucosa-to-mucosa apposition.  Similarly, the right ureter  was anastomosed on the contralateral aspect of the proximal conduit in a similar fashion with a red-colored bander stent being placed.  The stents were sutured in place further anchored using interrupted chromic through and through at midway portion of the bowel where the ileal conduit, keeping from migrating  postoperatively.  The abdominal cavity was inspected.  All sponge and needle counts were correct.  _Retractor was removed.  The previous marked conduit site  was further opened into the level of skin and column of fiber fatty tissue was dissected down to the  level of the external fascia dilated using 2 surgeon's fingers and the distal conduit was brought through this along with the distal aspect of the stents and the segment was a sutured to the level of fascia using interrupted 0 Vicryl x4 and was abutting sutures of 2-0 Vicryl were placed x4 with intervening interrupted 3-0 Vicryl, dividing the bowel mucosa to skin approximation circumferentially.  The previous far lateral 12-mm assistant port site was closed with fascia using 2-0 Vicryl stitch was applied.  After a Jackson-Pratt drain was brought through the previous left lateral most robotic port site near the area of the pelvis.  It did not directly touch the ureteroenteric anastomosis.  The closed the fascia using figure-of-eight PDS times approximately 8 followed by placement of a single lumen ON-Q pain catheter on to the fascia.  Scarpa was reapproximated using running Vicryl and all skin sites were all reapproximated using subcuticular Monocryl followed by Dermabond.  The patient required 1 unit of blood intraoperatively given for Cardiac protective indications, not for blood loss.  He did have some ST- depression intraoperatively  worrisome for possible ischemia; however, he had no arrhythmia.  Also given these findings, consultation with Critical Care and Cardiology will be sought postoperatively for optimization.          ______________________________ Alexis Frock, MD     TM/MEDQ  D:  10/26/2013  T:  10/27/2013  Job:  [id number]

## 2013-10-27 NOTE — Progress Notes (Signed)
Patient Name: [name]. Date of Encounter: 10/27/2013     Active Problems:   ATHEROSCLEROSIS, CORONARY, ARTERY BYPASS GRFT   Acute post-hemorrhagic anemia   Bladder cancer   CAD in native artery = CABG in 1983; mild inferior ischemia on 2010 Nuc ST.   CKD (chronic kidney disease), stage III   Anemia   Peripheral vascular disease   Abnormal finding on EKG - diffuse ST-Depressio   Frequent PVCs    SUBJECTIVE  The patient feels well today.  He denies any chest pain or shortness of breath.  The IV nitroglycerin was stopped during the night.  His blood pressure remains soft.  CURRENT MEDS . cholecalciferol  1,000 Units Oral Daily  . heparin subcutaneous  5,000 Units Subcutaneous 3 times per day  . HYDROmorphone PCA 0.3 mg/mL   Intravenous 6 times per day  . metoprolol  5 mg Intravenous 4 times per day  . pantoprazole  40 mg Oral Daily  . piperacillin-tazobactam (ZOSYN)  IV  3.375 g Intravenous 3 times per day  . senna-docusate  2 tablet Oral QHS    OBJECTIVE  Filed Vitals:   10/27/13 0300 10/27/13 0400 10/27/13 0500 10/27/13 0600  BP: 87/46 83/53 109/65 89/56  Pulse: 73 74 78 81  Temp:  98.4 F (36.9 C)    TempSrc:  Oral    Resp: 9 11 17 14   Height:      Weight:      SpO2: 96% 98% 99% 98%    Intake/Output Summary (Last 24 hours) at 10/27/13 0830 Last data filed at 10/27/13 0600  Gross per 24 hour  Intake 5461.66 ml  Output   1310 ml  Net 4151.66 ml   Filed Weights   10/25/13 1121 10/26/13 1634  Weight: 163 lb 6.4 oz (74.118 kg) 168 lb 6.9 oz (76.4 kg)    PHYSICAL EXAM  General: Pleasant, NAD. Neuro: Alert and oriented X 3. Moves all extremities spontaneously. Psych: Normal affect. HEENT:  Normal  Neck: Supple without bruits or JVD. Lungs:  Resp regular and unlabored, CTA. Heart: RRR no s3, s4.  Grade 2/6 systolic ejection murmur at the left sternal edge and aortic area. Abdomen: Soft, non-tender, non-distended,  Extremities: No clubbing,  cyanosis or edema. DP/PT/Radials 2+ and equal bilaterally.  Accessory Clinical Findings  CBC  Recent Labs  10/25/13 1250 10/26/13 1510 10/27/13 0440  WBC 7.2  --  10.4  HGB 9.9* 9.5* 8.4*  HCT 30.3* 28.7* 25.4*  MCV 83.0  --  82.5  PLT 417*  --  465   Basic Metabolic Panel  Recent Labs  10/26/13 2140 10/27/13 0423 10/27/13 0440  NA 132*  --  136*  K 4.7  --  4.8  CL 101  --  104  CO2 19  --  21  GLUCOSE 171*  --  143*  BUN 16  --  17  CREATININE 1.47*  --  1.58*  CALCIUM 8.1*  --  8.1*  MG  --  1.6  --    Liver Function Tests  Recent Labs  10/25/13 1250  AST 21  ALT 8  ALKPHOS 109  BILITOT 0.3  PROT 7.3  ALBUMIN 3.6   No results found for this basename: LIPASE, AMYLASE,  in the last 72 hours Cardiac Enzymes  Recent Labs  10/26/13 1532  TROPONINI <0.30   BNP No components found with this basename: POCBNP,  D-Dimer No results found for this basename: DDIMER,  in the last 72  hours Hemoglobin A1C No results found for this basename: HGBA1C,  in the last 72 hours Fasting Lipid Panel No results found for this basename: CHOL, HDL, LDLCALC, TRIG, CHOLHDL, LDLDIRECT,  in the last 72 hours Thyroid Function Tests No results found for this basename: TSH, T4TOTAL, FREET3, T3FREE, THYROIDAB,  in the last 72 hours  TELE  Normal sinus rhythm with frequent PVCs  ECG  Repeat EKG pending.  Radiology/Studies  Dg Chest Port 1 View  10/26/2013   CLINICAL DATA:  80 year old male with abnormal EKG. Initial encounter.  EXAM: PORTABLE CHEST - 1 VIEW  COMPARISON:  08/28/2013. [facility] CT Abdomen and Pelvis 08/02/2013, and earlier.  FINDINGS: Portable AP semi upright view at a 1543 hrs. Lower lung volumes. Stable cardiac size and mediastinal contours. Allowing for portable technique, the lungs are clear. Stable small to moderate retrocardiac hiatal hernia. No pneumothorax or effusion.  IMPRESSION: No acute cardiopulmonary abnormality.  Chronic hiatal  hernia.   Electronically Signed   By: Lars Pinks M.D.   On: 10/26/2013 16:03   Dg Abd Portable 1v  10/26/2013   CLINICAL DATA:  Postop bilateral double-J ureteral stent placement via neobladder in patient with recent cystectomy due to bladder cancer.  EXAM: PORTABLE ABDOMEN - 1 VIEW  COMPARISON:  Preoperative CT abdomen and pelvis 08/02/2013.  FINDINGS: Bilateral double-J ureteral stents with the proximal pigtails at the expected location of the renal pelvis bilaterally, extending through the ureters into the neobladder, and externally through the stoma. Surgical drain in the low pelvis. Surgical anastomotic suture material in the right mid abdomen at the site of the neobladder.  Bowel gas pattern unremarkable without evidence of obstruction or significant ileus. Expected stool burden in the colon. Calcified gallstones in the right upper quadrant as noted on the prior CT.  IMPRESSION: 1. Bilateral J ureteral stent placement via the neobladder. 2. No acute abdominal abnormality. 3. Cholelithiasis.   Electronically Signed   By: Evangeline Dakin M.D.   On: 10/26/2013 16:04    ASSESSMENT AND PLAN 1. Bladder cancer s/p cystoscopy POD #0  2. CAD s/p CABG 1983, mild inferior ischemia by nuc 2010 with post-op EKG changes and QT prolongation.  No chest pain.  Troponin normal.  3. Recent ABL anemia with hematuria  4. CKD stage III  5. HTN, presently blood pressure soft postop  6. PVD 7. heart murmur  Plan: Continue IV metoprolol until taking orally satisfactory then switch to Lopressor 12.5 mg by mouth twice a day with parameters. Check followup EKG today.  Await results of echocardiogram.  Signed, Darlin Coco MD

## 2013-10-27 NOTE — Progress Notes (Signed)
PT Cancellation Note  Patient Details Name: [name]. MRN: [id number] DOB: 04-14-1931   Cancelled Treatment:    Reason Eval/Treat Not Completed: Medical issues which prohibited therapy (chest pain). RN requested no PT today due to chest pain. Will re-attempt tomorrow.    Blondell Reveal Kistler 10/27/2013, 11:29 AM [phone]

## 2013-10-27 NOTE — Progress Notes (Signed)
PULMONARY / CRITICAL CARE MEDICINE   Name: [name]. MRN: [id number] DOB: 1931/08/14    ADMISSION DATE:  10/25/2013 CONSULTATION DATE:  10/25/2013  REFERRING MD :  Tresa Moore  CHIEF COMPLAINT:  Chest pain post cystectomy and prostectomy and node dissection  INITIAL PRESENTATION: 80 y/o male with high grade urethelial carcinoma underwent cystectomy, prostectomy and node dissection on 7/24 and developed ST depression intraoperatively so PCCM and Cardiology consulted for further management.  Sent to ICU post operatively.  STUDIES:  7/24 EKG: Sinus rhythm with PVC, lateral ST depression/TWI 7/25 TTE >>   SIGNIFICANT EVENTS: 7/24 ROBOTIC ASSISTED LAPAROSCOPIC CYSTECTOMY AND PROSTECTOMY WITH NODE DISSECTION, ILEAL CONDUIT, INSERTION OF FACIAL PAIN PUMP (N/A)  CYSTOSCOPY WITH CLOT EVACUATION , INDOCYANINE GREEN DYE INJECTION (N/A)  SUBJECTIVE:  Denies CP, feels better after a good night's sleep No BM or flatus yet  VITAL SIGNS: Temp:  [96.5 F (35.8 C)-98.4 F (36.9 C)] 98.4 F (36.9 C) (07/25 0400) Pulse Rate:  [61-89] 75 (07/25 0830) Resp:  [9-25] 25 (07/25 0839) BP: (65-174)/(38-94) 116/63 mmHg (07/25 0830) SpO2:  [92 %-100 %] 97 % (07/25 0839) Arterial Line BP: (74-196)/(37-76) 116/55 mmHg (07/25 0830) Weight:  [76.4 kg (168 lb 6.9 oz)] 76.4 kg (168 lb 6.9 oz) (07/24 1634) HEMODYNAMICS:   VENTILATOR SETTINGS:   INTAKE / OUTPUT:  Intake/Output Summary (Last 24 hours) at 10/27/13 7322 Last data filed at 10/27/13 0800  Gross per 24 hour  Intake 6481.66 ml  Output   1460 ml  Net 5021.66 ml    PHYSICAL EXAMINATION: General:  Elderly man, lying comfortably in bed, NAD Neuro:  Alert, oriented x 3.  Nonfocal HEENT:  PERRL Cardiovascular:  RRR, 2/6 SM heard throughout precordium Lungs:  Normal WOB, good air movement, no wheezes or crackles Abdomen: No BS noted, post op tenderness Musculoskeletal:  MAEW Skin:  No rashes or breakdown, no edema  LABS:  CBC  Recent  Labs Lab 10/25/13 1250 10/26/13 1510 10/27/13 0440  WBC 7.2  --  10.4  HGB 9.9* 9.5* 8.4*  HCT 30.3* 28.7* 25.4*  PLT 417*  --  329   Coag's No results found for this basename: APTT, INR,  in the last 168 hours BMET  Recent Labs Lab 10/26/13 1510 10/26/13 2140 10/27/13 0440  NA 134* 132* 136*  K 4.4 4.7 4.8  CL 102 101 104  CO2 19 19 21   BUN 15 16 17   CREATININE 1.35 1.47* 1.58*  GLUCOSE 190* 171* 143*   Electrolytes  Recent Labs Lab 10/26/13 1510 10/26/13 2140 10/27/13 0423 10/27/13 0440  CALCIUM 8.6 8.1*  --  8.1*  MG  --   --  1.6  --    Sepsis Markers No results found for this basename: LATICACIDVEN, PROCALCITON, O2SATVEN,  in the last 168 hours ABG  Recent Labs Lab 10/26/13 1540  PHART 7.296*  PCO2ART 43.1  PO2ART 128.0*   Liver Enzymes  Recent Labs Lab 10/25/13 1250  AST 21  ALT 8  ALKPHOS 109  BILITOT 0.3  ALBUMIN 3.6   Cardiac Enzymes  Recent Labs Lab 10/26/13 1532  TROPONINI <0.30   Glucose No results found for this basename: GLUCAP,  in the last 168 hours  Imaging CXR: unremarkable, no acute cardiopulmonary disease   ASSESSMENT / PLAN:  PULMONARY A: No acute issues P:   -O2 as needed for O2 saturation > 92% -OOB when OK per Urology -Incentive spirometry  CARDIOVASCULAR CVL N/A A: Known CAD with ST depression intraoperatively.  Suspect 2/2 either intraop hypotension and demand/ supply mismatch or due to drugs received intraop (ephedrine, phenylephrine).  Pt improved.  Do not suspect ACS as no chest pain currently and no further EKG changes.  P:  -tele -asa vs anticoagulation, b-blocker per cardiology -serial troponin, initial trop 15:32 7/24 negative -serial EKG  RENAL A:  Acute on CKD Mixed non-gap metabolic and resp acidosis post-op > likely related to cystectomy and mild resp acidosis from narcotic P:   -renal dose meds -follow BMP and UOP  GASTROINTESTINAL A:  No acute issues GERD P:   -advance diet  when OK per urology -continue PPI -guard against ileus  HEMATOLOGIC A:  Anemia, minimal blood loss intra operatively P:  -monitor for bleeding  INFECTIOUS A:  Perioperative antibiotics P:   -per urology -monitor fever/WBC  ENDOCRINE A:  No acute issues P:   -monitor glucose  NEUROLOGIC A:  Postoperative pain P:   -dilaudid PCA > transition to PO when able  TODAY'S SUMMARY: 80 y/o male with known CAD admitted on 7/24 post operatively from a cystectomy and prostectomy for urethelial malignancy, developed ST depression intra-operatively.  No evidence ACS at this point. Cardiology following. Note acute on chronic renal insufficiency - will need to be followed. OK from CCM standpoint to transfer out of ICU 7/25. Please call us if we can assist further.    I have personally obtained a history, examined the patient, evaluated laboratory and imaging results, formulated the assessment and plan and placed orders.    Baltazar Apo, MD, PhD 10/27/2013, 9:13 AM [place] [facility] [phone] or if no answer [phone]

## 2013-10-27 NOTE — Progress Notes (Signed)
1 Day Post-Op Subjective: Doing well. No complaints. Pain well controlled. No chest pain. Thirsty. No flatus. No N/V/F/C.  Objective: Vital signs in last 24 hours: Temp:  [96.5 F (35.8 C)-98.4 F (36.9 C)] 98 F (36.7 C) (07/25 0800) Pulse Rate:  [61-101] 101 (07/25 1000) Resp:  [9-25] 21 (07/25 1000) BP: (65-174)/(38-94) 138/57 mmHg (07/25 0900) SpO2:  [92 %-100 %] 94 % (07/25 1000) Arterial Line BP: (74-196)/(37-96) 176/96 mmHg (07/25 1000) Weight:  [76.4 kg (168 lb 6.9 oz)] 76.4 kg (168 lb 6.9 oz) (07/24 1634)  Intake/Output from previous day: 07/24 0701 - 07/25 0700 In: 5471.7 [I.V.:4536.7; Blood:335; IV VQMGQQPYP:950] Out: 1310 [Urine:1015; Drains:145; Blood:150] Intake/Output this shift: Total I/O In: 1230 [I.V.:1230] Out: 185 [Urine:150; Drains:35]  Physical Exam:  General: Alert and oriented CV: RRR Lungs: Clear Abdomen: Soft, ND Incisions: CDI. Pain pump in place. JP serosang Urostomy pink/healthy. Stents visible. Urine clear yellow. Ext: NT, No erythema  Lab Results:  Recent Labs  10/25/13 1250 10/26/13 1510 10/27/13 0440  HGB 9.9* 9.5* 8.4*  HCT 30.3* 28.7* 25.4*   BMET  Recent Labs  10/26/13 2140 10/27/13 0440  NA 132* 136*  K 4.7 4.8  CL 101 104  CO2 19 21  GLUCOSE 171* 143*  BUN 16 17  CREATININE 1.47* 1.58*  CALCIUM 8.1* 8.1*     Studies/Results: Dg Chest Port 1 View  10/26/2013   CLINICAL DATA:  80 year old male with abnormal EKG. Initial encounter.  EXAM: PORTABLE CHEST - 1 VIEW  COMPARISON:  08/28/2013. [facility] CT Abdomen and Pelvis 08/02/2013, and earlier.  FINDINGS: Portable AP semi upright view at a 1543 hrs. Lower lung volumes. Stable cardiac size and mediastinal contours. Allowing for portable technique, the lungs are clear. Stable small to moderate retrocardiac hiatal hernia. No pneumothorax or effusion.  IMPRESSION: No acute cardiopulmonary abnormality.  Chronic hiatal hernia.   Electronically Signed   By:  Lars Pinks M.D.   On: 10/26/2013 16:03   Dg Abd Portable 1v  10/26/2013   CLINICAL DATA:  Postop bilateral double-J ureteral stent placement via neobladder in patient with recent cystectomy due to bladder cancer.  EXAM: PORTABLE ABDOMEN - 1 VIEW  COMPARISON:  Preoperative CT abdomen and pelvis 08/02/2013.  FINDINGS: Bilateral double-J ureteral stents with the proximal pigtails at the expected location of the renal pelvis bilaterally, extending through the ureters into the neobladder, and externally through the stoma. Surgical drain in the low pelvis. Surgical anastomotic suture material in the right mid abdomen at the site of the neobladder.  Bowel gas pattern unremarkable without evidence of obstruction or significant ileus. Expected stool burden in the colon. Calcified gallstones in the right upper quadrant as noted on the prior CT.  IMPRESSION: 1. Bilateral J ureteral stent placement via the neobladder. 2. No acute abdominal abnormality. 3. Cholelithiasis.   Electronically Signed   By: Evangeline Dakin M.D.   On: 10/26/2013 16:04    Assessment/Plan: 80yM with MIBC POD#1 from RARC/IC. EBL 150. Transfused 1U for intraop hypotension and ST depression. So far, no signs of cardiac event. Chest pain free. Appreciate cardiology involvement.  Will transfer to floor with tele today.  Neuro: continue pain pump and PCA.  CV: mild hypotension. Added hold parameters to bblockade.   Resp: wean O2 as able. Continue IS. Post-op atelectasis.  FEN/GI: continue fluids. Maintain NPO. AROBF. Clears tomorrow.  Heme/ID: continue zosyn at renal dose. Hgb drop likely represents dilution. Acute blood loss anemia without signs of ongoing bleeding. Will  recheck h/h in AM.  Prophy: heparin tid.  Dispo: likely inpatient at least 5-7 days.   LOS: 2 days   Margo Aye 10/27/2013, 10:33 AM

## 2013-10-27 NOTE — Progress Notes (Signed)
Agree with Dr. Johnnye Sima A/P.

## 2013-10-27 NOTE — Progress Notes (Signed)
Pt reported 5/10 chest pain radiating to right arm.  Nitro drip restarted and got an EKG.  Notified MD. Pt's pain steadily decreasing and will continue to monitor pt.

## 2013-10-28 LAB — CBC
HCT: 24.7 % — ABNORMAL LOW (ref 39.0–52.0)
Hemoglobin: 8.1 g/dL — ABNORMAL LOW (ref 13.0–17.0)
MCH: 26.9 pg (ref 26.0–34.0)
MCHC: 32.8 g/dL (ref 30.0–36.0)
MCV: 82.1 fL (ref 78.0–100.0)
PLATELETS: 329 10*3/uL (ref 150–400)
RBC: 3.01 MIL/uL — AB (ref 4.22–5.81)
RDW: 14.5 % (ref 11.5–15.5)
WBC: 13.6 10*3/uL — AB (ref 4.0–10.5)

## 2013-10-28 LAB — BASIC METABOLIC PANEL
ANION GAP: 11 (ref 5–15)
BUN: 13 mg/dL (ref 6–23)
CHLORIDE: 101 meq/L (ref 96–112)
CO2: 22 meq/L (ref 19–32)
Calcium: 8.3 mg/dL — ABNORMAL LOW (ref 8.4–10.5)
Creatinine, Ser: 1.53 mg/dL — ABNORMAL HIGH (ref 0.50–1.35)
GFR calc Af Amer: 48 mL/min — ABNORMAL LOW (ref >90)
GFR calc non Af Amer: 41 mL/min — ABNORMAL LOW (ref >90)
Glucose, Bld: 134 mg/dL — ABNORMAL HIGH (ref 70–99)
Potassium: 4 mEq/L (ref 3.7–5.3)
SODIUM: 134 meq/L — AB (ref 137–147)

## 2013-10-28 MED ORDER — ISOSORBIDE MONONITRATE ER 60 MG PO TB24
60.0000 mg | ORAL_TABLET | Freq: Every day | ORAL | Status: DC
  Administered 2013-10-28 – 2013-10-31 (×4): 60 mg via ORAL
  Filled 2013-10-28 (×5): qty 1

## 2013-10-28 MED ORDER — CHLORHEXIDINE GLUCONATE 0.12 % MT SOLN: 15.0000 mL | Freq: Two times a day (BID) | OROMUCOSAL | Status: DC

## 2013-10-28 MED ORDER — METOPROLOL TARTRATE 25 MG PO TABS
25.0000 mg | ORAL_TABLET | Freq: Two times a day (BID) | ORAL | Status: DC
  Administered 2013-10-28 (×2): 25 mg via ORAL
  Filled 2013-10-28 (×2): qty 1

## 2013-10-28 MED ORDER — PROMETHAZINE HCL 25 MG/ML IJ SOLN
12.5000 mg | Freq: Four times a day (QID) | INTRAMUSCULAR | Status: DC | PRN
  Administered 2013-10-28: 12.5 mg via INTRAVENOUS
  Filled 2013-10-28: qty 1

## 2013-10-28 MED ORDER — CHLORHEXIDINE GLUCONATE 0.12 % MT SOLN
15.0000 mL | Freq: Two times a day (BID) | OROMUCOSAL | Status: DC
  Administered 2013-10-29: 15 mL via OROMUCOSAL
  Filled 2013-10-28: qty 15

## 2013-10-28 MED ORDER — OMEPRAZOLE 20 MG PO CPDR
40.0000 mg | DELAYED_RELEASE_CAPSULE | Freq: Every day | ORAL | Status: DC
  Administered 2013-10-28 – 2013-10-31 (×4): 40 mg via ORAL
  Filled 2013-10-28 (×5): qty 2

## 2013-10-28 MED ORDER — BIOTENE DRY MOUTH MT LIQD
15.0000 mL | Freq: Two times a day (BID) | OROMUCOSAL | Status: DC
  Administered 2013-10-29 – 2013-10-31 (×4): 15 mL via OROMUCOSAL

## 2013-10-28 NOTE — Progress Notes (Signed)
2 Days Post-Op: cystectomy, ileal loop Subjective: Patient reports recovery from episode of chest pain yesterday. Active Problems:  ATHEROSCLEROSIS, CORONARY, ARTERY BYPASS GRFT  Acute post-hemorrhagic anemia  Bladder cancer  CAD in native artery = CABG in 1983; mild inferior ischemia on 2010 Nuc ST.  CKD (chronic kidney disease), stage III  Anemia  Peripheral vascular disease  Abnormal finding on EKG - diffuse ST-Depressio  Frequent PVCs   SUBJECTIVE  No further angina.  Echo shows good LV systolic function and mild AS      Objective: Vital signs in last 24 hours: Temp:  [97.7 F (36.5 C)-99.1 F (37.3 C)] 98.3 F (36.8 C) (07/26 0800) Pulse Rate:  [44-107] 89 (07/26 0800) Resp:  [10-24] 17 (07/26 0800) BP: (79-173)/(47-89) 142/82 mmHg (07/26 0800) SpO2:  [90 %-99 %] 96 % (07/26 0800) Arterial Line BP: (86-165)/(39-103) 156/70 mmHg (07/26 0800) Weight:  [78.3 kg (172 lb 9.9 oz)] 78.3 kg (172 lb 9.9 oz) (07/26 0001)  Intake/Output from previous day: 07/25 0701 - 07/26 0700 In: 3665.9 [I.V.:3395.9; IV Piggyback:150] Out: 2125 [Urine:1740; Drains:385] Intake/Output this shift: Total I/O In: 113.3 [I.V.:103.3; Other:10] Out: 225 [Urine:165; Drains:60]  Physical Exam:  General:alert and cooperative GI: not done and soft, non tender, normal bowel sounds, no palpable masses, no organomegaly, no inguinal hernia Male genitalia: not done no bladder distension noted Extremities: extremities normal, atraumatic, no cyanosis or edema  Lab Results:  Recent Labs  10/26/13 1510 10/27/13 0440 10/28/13 0500  HGB 9.5* 8.4* 8.1*  HCT 28.7* 25.4* 24.7*   BMET  Recent Labs  10/27/13 0440 10/28/13 0500  NA 136* 134*  K 4.8 4.0  CL 104 101  CO2 21 22  GLUCOSE 143* 134*  BUN 17 13  CREATININE 1.58* 1.53*  CALCIUM 8.1* 8.3*   No results found for this basename: LABPT, INR,  in the last 72 hours No results found for this basename: LABURIN,  in the last 72  hours Results for orders placed during the hospital encounter of 10/25/13  SURGICAL PCR SCREEN     Status: None   Collection Time    10/26/13  6:05 AM      Result Value Ref Range Status   MRSA, PCR NEGATIVE  NEGATIVE Final   Staphylococcus aureus NEGATIVE  NEGATIVE Final   Comment:            The Xpert SA Assay (FDA     approved for NASAL specimens     in patients over 71 years of age),     is one component of     a comprehensive surveillance     program.  Test performance has     been validated by [employer] for patients greater     than or equal to 76 year old.     It is not intended     to diagnose infection nor to     guide or monitor treatment.    Studies/Results: Dg Chest Port 1 View  10/26/2013   CLINICAL DATA:  80 year old male with abnormal EKG. Initial encounter.  EXAM: PORTABLE CHEST - 1 VIEW  COMPARISON:  08/28/2013. [facility] CT Abdomen and Pelvis 08/02/2013, and earlier.  FINDINGS: Portable AP semi upright view at a 1543 hrs. Lower lung volumes. Stable cardiac size and mediastinal contours. Allowing for portable technique, the lungs are clear. Stable small to moderate retrocardiac hiatal hernia. No pneumothorax or effusion.  IMPRESSION: No acute cardiopulmonary abnormality.  Chronic hiatal hernia.  Electronically Signed   By: Lars Pinks M.D.   On: 10/26/2013 16:03   Dg Abd Portable 1v  10/26/2013   CLINICAL DATA:  Postop bilateral double-J ureteral stent placement via neobladder in patient with recent cystectomy due to bladder cancer.  EXAM: PORTABLE ABDOMEN - 1 VIEW  COMPARISON:  Preoperative CT abdomen and pelvis 08/02/2013.  FINDINGS: Bilateral double-J ureteral stents with the proximal pigtails at the expected location of the renal pelvis bilaterally, extending through the ureters into the neobladder, and externally through the stoma. Surgical drain in the low pelvis. Surgical anastomotic suture material in the right mid abdomen at the site of the  neobladder.  Bowel gas pattern unremarkable without evidence of obstruction or significant ileus. Expected stool burden in the colon. Calcified gallstones in the right upper quadrant as noted on the prior CT.  IMPRESSION: 1. Bilateral J ureteral stent placement via the neobladder. 2. No acute abdominal abnormality. 3. Cholelithiasis.   Electronically Signed   By: Evangeline Dakin M.D.   On: 10/26/2013 16:04    Assessment/Plan: POD #2 post cystedctomy/ileal loop.  Beginning to get out of bed with PT this AM.  Per Cardiology:  1. Bladder cancer s/p cystoscopy POD #2 2. CAD s/p CABG 1983, mild inferior ischemia by nuc 2010 with post-op EKG changes and QT prolongation. Troponin normal.  3. Recent ABL anemia with hematuria  4. CKD stage III  5. HTN,  6. PVD  7. heart murmur mild aortic stenosis by echo.  Plan: Continue metoprolol. Switch nitrates to po route.  Advance activity.  If no further angina with activity, continue medical therapy and plan for outpatient ischemic evaluation with Dr. Rockey Situ after he has recovered further from his surgery.  If the patient continues to have angina pectoris we will need to consider cardiac catheterization during this hospital stay.   Pt appears stable this AM. Will leave in ICU stepdown this AM and plan move to floor Monday Am if stable. ( monitored bed)    LOS: 3 days   Darrell Hauk I 10/28/2013, 10:09 AM

## 2013-10-28 NOTE — Progress Notes (Signed)
Patient Name: [name]. Date of Encounter: 10/28/2013     Active Problems:   ATHEROSCLEROSIS, CORONARY, ARTERY BYPASS GRFT   Acute post-hemorrhagic anemia   Bladder cancer   CAD in native artery = CABG in 1983; mild inferior ischemia on 2010 Nuc ST.   CKD (chronic kidney disease), stage III   Anemia   Peripheral vascular disease   Abnormal finding on EKG - diffuse ST-Depressio   Frequent PVCs    SUBJECTIVE  No further angina. Echo shows good LV systolic function and mild AS  CURRENT MEDS . aspirin  81 mg Oral Daily  . cholecalciferol  1,000 Units Oral Daily  . heparin subcutaneous  5,000 Units Subcutaneous 3 times per day  . HYDROmorphone PCA 0.3 mg/mL   Intravenous 6 times per day  . metoprolol  5 mg Intravenous 4 times per day  . metoprolol tartrate  12.5 mg Oral BID  . pantoprazole (PROTONIX) IV  40 mg Intravenous Daily  . piperacillin-tazobactam (ZOSYN)  IV  2.25 g Intravenous 3 times per day  . rosuvastatin  10 mg Oral q1800  . senna-docusate  2 tablet Oral QHS    OBJECTIVE  Filed Vitals:   10/28/13 0425 10/28/13 0500 10/28/13 0600 10/28/13 0700  BP:  115/57 152/54 127/70  Pulse:  76 88 44  Temp:      TempSrc:      Resp: 13 20 21 17   Height:      Weight:      SpO2: 97% 96% 99% 98%    Intake/Output Summary (Last 24 hours) at 10/28/13 0740 Last data filed at 10/28/13 0700  Gross per 24 hour  Intake 3665.87 ml  Output   2125 ml  Net 1540.87 ml   Filed Weights   10/25/13 1121 10/26/13 1634 10/28/13 0001  Weight: 163 lb 6.4 oz (74.118 kg) 168 lb 6.9 oz (76.4 kg) 172 lb 9.9 oz (78.3 kg)    PHYSICAL EXAM  General: Pleasant, NAD. Neuro: Alert and oriented X 3. Moves all extremities spontaneously. Psych: Normal affect. HEENT:  Normal  Neck: Supple without bruits or JVD. Lungs:  Resp regular and unlabored, CTA. Heart: RRR Grade 2/6 aortic stenosis murmur Abdomen: Soft, non-tender, non-distended, BS + x 4.  Extremities: No clubbing, cyanosis  or edema. DP/PT/Radials 2+ and equal bilaterally.  Accessory Clinical Findings  CBC  Recent Labs  10/27/13 0440 10/28/13 0500  WBC 10.4 13.6*  HGB 8.4* 8.1*  HCT 25.4* 24.7*  MCV 82.5 82.1  PLT 329 643   Basic Metabolic Panel  Recent Labs  10/26/13 2140 10/27/13 0423 10/27/13 0440 10/28/13 0500  NA 132*  --  136* 134*  K 4.7  --  4.8 4.0  CL 101  --  104 101  CO2 19  --  21 22  GLUCOSE 171*  --  143* 134*  BUN 16  --  17 13  CREATININE 1.47*  --  1.58* 1.53*  CALCIUM 8.1*  --  8.1* 8.3*  MG  --  1.6  --   --    Liver Function Tests  Recent Labs  10/25/13 1250  AST 21  ALT 8  ALKPHOS 109  BILITOT 0.3  PROT 7.3  ALBUMIN 3.6   No results found for this basename: LIPASE, AMYLASE,  in the last 72 hours Cardiac Enzymes  Recent Labs  10/26/13 1532 10/27/13 1136  TROPONINI <0.30 <0.30   BNP No components found with this basename: POCBNP,  D-Dimer No results found  for this basename: DDIMER,  in the last 72 hours Hemoglobin A1C No results found for this basename: HGBA1C,  in the last 72 hours Fasting Lipid Panel No results found for this basename: CHOL, HDL, LDLCALC, TRIG, CHOLHDL, LDLDIRECT,  in the last 72 hours Thyroid Function Tests No results found for this basename: TSH, T4TOTAL, FREET3, T3FREE, THYROIDAB,  in the last 72 hours  TELE  NSR with PVCs  ECG    Radiology/Studies  Dg Chest Port 1 View  10/26/2013   CLINICAL DATA:  80 year old male with abnormal EKG. Initial encounter.  EXAM: PORTABLE CHEST - 1 VIEW  COMPARISON:  08/28/2013. [facility] CT Abdomen and Pelvis 08/02/2013, and earlier.  FINDINGS: Portable AP semi upright view at a 1543 hrs. Lower lung volumes. Stable cardiac size and mediastinal contours. Allowing for portable technique, the lungs are clear. Stable small to moderate retrocardiac hiatal hernia. No pneumothorax or effusion.  IMPRESSION: No acute cardiopulmonary abnormality.  Chronic hiatal hernia.    Electronically Signed   By: Lars Pinks M.D.   On: 10/26/2013 16:03   Dg Abd Portable 1v  10/26/2013   CLINICAL DATA:  Postop bilateral double-J ureteral stent placement via neobladder in patient with recent cystectomy due to bladder cancer.  EXAM: PORTABLE ABDOMEN - 1 VIEW  COMPARISON:  Preoperative CT abdomen and pelvis 08/02/2013.  FINDINGS: Bilateral double-J ureteral stents with the proximal pigtails at the expected location of the renal pelvis bilaterally, extending through the ureters into the neobladder, and externally through the stoma. Surgical drain in the low pelvis. Surgical anastomotic suture material in the right mid abdomen at the site of the neobladder.  Bowel gas pattern unremarkable without evidence of obstruction or significant ileus. Expected stool burden in the colon. Calcified gallstones in the right upper quadrant as noted on the prior CT.  IMPRESSION: 1. Bilateral J ureteral stent placement via the neobladder. 2. No acute abdominal abnormality. 3. Cholelithiasis.   Electronically Signed   By: Evangeline Dakin M.D.   On: 10/26/2013 16:04    ASSESSMENT AND PLAN  1. Bladder cancer s/p cystoscopy POD #0  2. CAD s/p CABG 1983, mild inferior ischemia by nuc 2010 with post-op EKG changes and QT prolongation.  Troponin normal.  3. Recent ABL anemia with hematuria  4. CKD stage III  5. HTN,  6. PVD  7. heart murmur mild aortic stenosis by echo.  Plan: Continue metoprolol. Switch nitrates to po route. Advance activity. If no further angina with activity, continue medical therapy and plan for outpatient ischemic evaluation with Dr. Rockey Situ after he has recovered further from his surgery. If the patient continues to have angina pectoris we will need to consider cardiac catheterization during this hospital stay.   Signed, Darlin Coco MD

## 2013-10-28 NOTE — Evaluation (Signed)
Physical Therapy Evaluation Patient Details Name: [name]. MRN: [id number] DOB: [birth year] Today's Date: 10/28/2013   History of Present Illness  Pt is 80 y.o. male admitted with bladder cancer and is s/p ROBOTIC ASSISTED LAPAROSCOPIC CYSTECTOMY AND PROSTECTOMY WITH NODE DISSECTION,  ILEAL CONDUIT.  Clinical Impression  *Pt admitted with bladder cancer, s/p cystectomy**. Pt currently with functional limitations due to the deficits listed below (see PT Problem List).  Pt will benefit from skilled PT to increase their independence and safety with mobility to allow discharge to the venue listed below.   Pt ambulated 30' with RW and min assist this morning. Distance limited by fatigue. Good progress expected. Will follow to maximize safety and independence with mobility.   **    Follow Up Recommendations Home health PT;Supervision for mobility/OOB    Equipment Recommendations  Rolling walker with 5" wheels    Recommendations for Other Services OT consult     Precautions / Restrictions Precautions Precautions: Fall Precaution Comments: multiple lines/drains, onQ Restrictions Weight Bearing Restrictions: No      Mobility  Bed Mobility Overal bed mobility: Needs Assistance;+2 for physical assistance;+ 2 for safety/equipment Bed Mobility: Supine to Sit     Supine to sit: +2 for physical assistance;+2 for safety/equipment;Mod assist     General bed mobility comments: assist to raise trunk  Transfers Overall transfer level: Needs assistance Equipment used: Rolling walker (2 wheeled) Transfers: Sit to/from Stand Sit to Stand: Mod assist;+2 safety/equipment         General transfer comment: 2 IV poles, multiple lines  Ambulation/Gait Ambulation/Gait assistance: +2 safety/equipment;Min guard Ambulation Distance (Feet): 30 Feet Assistive device: Rolling walker (2 wheeled) Gait Pattern/deviations: Decreased stride length;Trunk flexed   Gait velocity interpretation:  Below normal speed for age/gender General Gait Details: distance limited by fatigue, VSS  Stairs            Wheelchair Mobility    Modified Rankin (Stroke Patients Only)       Balance Overall balance assessment: Needs assistance Sitting-balance support: Bilateral upper extremity supported;Feet supported Sitting balance-Leahy Scale: Fair     Standing balance support: Bilateral upper extremity supported Standing balance-Leahy Scale: Poor                               Pertinent Vitals/Pain *vital signs stable SaO2 100% on RA with activity BP 118/64 in sitting**    Home Living Family/patient expects to be discharged to:: Private residence Living Arrangements: Spouse/significant other Available Help at Discharge: Available 24 hours/day;Family Type of Home: House Home Access: Stairs to enter   [employer] of Steps: 1 Home Layout: One level Home Equipment: Cane - single point      Prior Function Level of Independence: Independent         Comments: Pt drives, 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      Hand Dominance   Dominant Hand: Right    Extremity/Trunk Assessment   Upper Extremity Assessment: Overall WFL for tasks assessed           Lower Extremity Assessment: Overall WFL for tasks assessed      Cervical / Trunk Assessment: Kyphotic  Communication   Communication: No difficulties  Cognition Arousal/Alertness: Awake/alert Behavior During Therapy: WFL for tasks assessed/performed Overall Cognitive Status: Within Functional Limits for tasks assessed  General Comments      Exercises General Exercises - Lower Extremity Ankle Circles/Pumps: AROM;Both;10 reps Quad Sets: AROM;Both;5 reps Gluteal Sets: AROM;Both;5 reps      Assessment/Plan    PT Assessment Patient needs continued PT services  PT Diagnosis Difficulty walking;Generalized weakness;Acute pain    PT Problem List Decreased activity tolerance;Decreased balance;Pain;Decreased knowledge of use of DME;Decreased mobility  PT Treatment Interventions DME instruction;Gait training;Stair training;Functional mobility training;Therapeutic activities;Patient/family education;Balance training;Therapeutic exercise   PT Goals (Current goals can be found in the Care Plan section) Acute Rehab PT Goals Patient Stated Goal: walk farther PT Goal Formulation: With patient/family Time For Goal Achievement: 11/11/13 Potential to Achieve Goals: Good    Frequency Min 3X/week   Barriers to discharge        Co-evaluation               End of Session Equipment Utilized During Treatment: Gait belt Activity Tolerance: Patient limited by fatigue Patient left: in chair;with call bell/phone within reach;with family/visitor present Nurse Communication: Mobility status         Time: 1010-1042 PT Time Calculation (min): 32 min   Charges:   PT Evaluation $Initial PT Evaluation Tier I: 1 Procedure PT Treatments $Gait Training: 8-22 mins $Therapeutic Activity: 8-22 mins   PT G Codes:          Philomena Doheny 10/28/2013, 11:35 AM [phone]

## 2013-10-29 ENCOUNTER — Encounter (HOSPITAL_COMMUNITY): Payer: Self-pay | Admitting: Cardiology

## 2013-10-29 DIAGNOSIS — I4891 Unspecified atrial fibrillation: Secondary | ICD-10-CM

## 2013-10-29 DIAGNOSIS — N183 Chronic kidney disease, stage 3 unspecified: Secondary | ICD-10-CM

## 2013-10-29 LAB — CBC
HCT: 21.2 % — ABNORMAL LOW (ref 39.0–52.0)
HEMOGLOBIN: 7 g/dL — AB (ref 13.0–17.0)
MCH: 27 pg (ref 26.0–34.0)
MCHC: 33 g/dL (ref 30.0–36.0)
MCV: 81.9 fL (ref 78.0–100.0)
Platelets: 312 10*3/uL (ref 150–400)
RBC: 2.59 MIL/uL — ABNORMAL LOW (ref 4.22–5.81)
RDW: 14.2 % (ref 11.5–15.5)
WBC: 13.3 10*3/uL — AB (ref 4.0–10.5)

## 2013-10-29 LAB — TYPE AND SCREEN
ABO/RH(D): AB POS
ANTIBODY SCREEN: NEGATIVE
UNIT DIVISION: 0
UNIT DIVISION: 0
Unit division: 0

## 2013-10-29 LAB — BASIC METABOLIC PANEL
Anion gap: 11 (ref 5–15)
BUN: 11 mg/dL (ref 6–23)
CHLORIDE: 102 meq/L (ref 96–112)
CO2: 22 mEq/L (ref 19–32)
Calcium: 8.1 mg/dL — ABNORMAL LOW (ref 8.4–10.5)
Creatinine, Ser: 1.38 mg/dL — ABNORMAL HIGH (ref 0.50–1.35)
GFR calc Af Amer: 54 mL/min — ABNORMAL LOW (ref >90)
GFR, EST NON AFRICAN AMERICAN: 47 mL/min — AB (ref >90)
Glucose, Bld: 131 mg/dL — ABNORMAL HIGH (ref 70–99)
POTASSIUM: 3.7 meq/L (ref 3.7–5.3)
Sodium: 135 mEq/L — ABNORMAL LOW (ref 137–147)

## 2013-10-29 LAB — PREPARE RBC (CROSSMATCH)

## 2013-10-29 MED ORDER — FUROSEMIDE 10 MG/ML IJ SOLN
20.0000 mg | Freq: Once | INTRAMUSCULAR | Status: AC
  Administered 2013-10-29: 20 mg via INTRAVENOUS
  Filled 2013-10-29: qty 2

## 2013-10-29 MED ORDER — METOPROLOL TARTRATE 25 MG PO TABS
25.0000 mg | ORAL_TABLET | Freq: Three times a day (TID) | ORAL | Status: DC
  Administered 2013-10-29 (×2): 25 mg via ORAL
  Filled 2013-10-29 (×2): qty 1

## 2013-10-29 MED ORDER — HYDROCODONE-ACETAMINOPHEN 5-325 MG PO TABS
1.0000 | ORAL_TABLET | ORAL | Status: DC | PRN
  Administered 2013-10-29 – 2013-10-30 (×3): 1 via ORAL
  Filled 2013-10-29 (×3): qty 1

## 2013-10-29 MED ORDER — HYDROMORPHONE HCL PF 1 MG/ML IJ SOLN: 0.3000 mg | INTRAMUSCULAR | Status: DC | PRN

## 2013-10-29 MED ORDER — METOPROLOL TARTRATE 1 MG/ML IV SOLN
5.0000 mg | Freq: Once | INTRAVENOUS | Status: AC
  Administered 2013-10-29: 5 mg via INTRAVENOUS
  Filled 2013-10-29: qty 5

## 2013-10-29 MED ORDER — CALCIUM CARBONATE ANTACID 500 MG PO CHEW
1.0000 | CHEWABLE_TABLET | Freq: Three times a day (TID) | ORAL | Status: DC | PRN
  Administered 2013-10-29: 200 mg via ORAL
  Filled 2013-10-29: qty 1

## 2013-10-29 NOTE — Progress Notes (Addendum)
Patient ID: Kenneth Solian., male   DOB: 1931-04-14, 80 y.o.   MRN: [id number]  Date of Encounter: 10/29/2013   Principal Problem:   Bladder cancer Active Problems:   CAD in native artery = CABG in 1983; mild inferior ischemia on 2010 Nuc ST.   Abnormal finding on EKG - diffuse ST-Depressio   Acute post-hemorrhagic anemia   CKD (chronic kidney disease), stage III   Frequent PVCs   Atrial fibrillation with RVR - New   ATHEROSCLEROSIS, CORONARY, ARTERY BYPASS GRFT   Anemia   Peripheral vascular disease   SUBJECTIVE -- Feels OK today - no sense of irregular Heartbeats. No further angina. Ambulated with PT. Echo shows good LV systolic function and mild AS  CURRENT MEDS . antiseptic oral rinse  15 mL Mouth Rinse q12n4p  . aspirin  81 mg Oral Daily  . chlorhexidine  15 mL Mouth Rinse BID  . cholecalciferol  1,000 Units Oral Daily  . heparin subcutaneous  5,000 Units Subcutaneous 3 times per day  . HYDROmorphone PCA 0.3 mg/mL   Intravenous 6 times per day  . isosorbide mononitrate  60 mg Oral Daily  . metoprolol tartrate  25 mg Oral BID  . omeprazole  40 mg Oral Daily  . piperacillin-tazobactam (ZOSYN)  IV  2.25 g Intravenous 3 times per day  . rosuvastatin  10 mg Oral q1800  . senna-docusate  2 tablet Oral QHS    OBJECTIVE  Filed Vitals:   10/29/13 0400 10/29/13 0500 10/29/13 0554 10/29/13 0600  BP: 125/59 116/68  146/85  Pulse: 68 71 90 88  Temp: 98.2 F (36.8 C)     TempSrc: Oral     Resp: 20 15 17    Height:      Weight:   174 lb 6.1 oz (79.1 kg)   SpO2: 96% 97% 98%     Intake/Output Summary (Last 24 hours) at 10/29/13 0753 Last data filed at 10/29/13 0600  Gross per 24 hour  Intake 2683.25 ml  Output   2605 ml  Net  78.25 ml   Filed Weights   10/26/13 1634 10/28/13 0001 10/29/13 0554  Weight: 168 lb 6.9 oz (76.4 kg) 172 lb 9.9 oz (78.3 kg) 174 lb 6.1 oz (79.1 kg)    PHYSICAL EXAM General: Pleasant, NAD. Neuro: Alert and oriented X 3. Moves all  extremities spontaneously. Psych: Normal affect. HEENT:  Normal  Neck: Supple without bruits or JVD. Lungs:  Resp regular and unlabored, CTA. Heart: Irreg Irreg with ~ tachycardia. Grade 2/6 aortic stenosis murmur Abdomen: Soft, non-tender, non-distended, BS + x 4.  Extremities: No clubbing, cyanosis or edema. DP/PT/Radials 2+ and equal bilaterally.  Accessory Clinical Findings  CBC  Recent Labs  10/28/13 0500 10/29/13 0415  WBC 13.6* 13.3*  HGB 8.1* 7.0*  HCT 24.7* 21.2*  MCV 82.1 81.9  PLT 329 027   Basic Metabolic Panel  Recent Labs  10/26/13 2140 10/27/13 0423  10/28/13 0500 10/29/13 0415  NA 132*  --   < > 134* 135*  K 4.7  --   < > 4.0 3.7  CL 101  --   < > 101 102  CO2 19  --   < > 22 22  GLUCOSE 171*  --   < > 134* 131*  BUN 16  --   < > 13 11  CREATININE 1.47*  --   < > 1.53* 1.38*  CALCIUM 8.1*  --   < > 8.3* 8.1*  MG  --  1.6  --   --   --   < > = values in this interval not displayed. Liver Function Tests No results found for this basename: AST, ALT, ALKPHOS, BILITOT, PROT, ALBUMIN,  in the last 72 hours No results found for this basename: LIPASE, AMYLASE,  in the last 72 hours Cardiac Enzymes  Recent Labs  10/26/13 1532 10/27/13 1136  TROPONINI <0.30 <0.30    TELE Afib with rates 90s-110s (New finding)  ECG Echo 7/25: Normal EF 60-65% without WMA. Gr2 Diastolic Dysfxn., Mild AS  Radiology/Studies - no new  ASSESSMENT AND PLAN Principal Problem:   Bladder cancer Active Problems:   CAD in native artery = CABG in 1983; mild inferior ischemia on 2010 Nuc ST.   Abnormal finding on EKG - diffuse ST-Depressio   Acute post-hemorrhagic anemia   CKD (chronic kidney disease), stage III   Frequent PVCs   Atrial fibrillation with RVR - New   ATHEROSCLEROSIS, CORONARY, ARTERY BYPASS GRFT   Anemia   Peripheral vascular disease   Plan: Continue metoprolol. Switch nitrates to po route. Advance activity. Will need to address anemia as Hgb has  dropped again this AM. -- if PRBC to be given, would give lasix ~20 mg IV between units.  Has not had any further angina, but is now in Afib with borderline rate -- for now would opt for rate control with BB; probably related to anemia, but plans are in place for OP ischemic evaluation.  Lack of angina with Afib ~RVR & anemia is reassuring.  Less likely inclined to pursue cath unless intractable angina given persistent & worsening anemia.    Anemia could also be related to supply vs. Demand ischemia mediated angina (this would go along with possible ischemic ECG changes intraop) If HR increases - would use CCB gtt, but for now will use BB (IV load x 1 5mg ).  BP much improved. With Afib - will increase BB dose to TID for now (with plans to convert back to BID at a standing dose on d/c); continue Imdur & Statin.  Cannot anticoagulate due to anemia & peri-op bleed.  Renal Fxn has improved  Signed, Leonie Man, M.D., M.S. Interventional Cardiologist   Pager # [phone] 10/29/2013

## 2013-10-29 NOTE — Progress Notes (Signed)
OT Cancellation Note  Patient Details Name: [name]. MRN: [id number] DOB: Jun 12, 1931   Cancelled Treatment:    Reason Eval/Treat Not Completed: Other (comment) Medical issues which prohibited therapy. Will recheck next day.   Betsy Pries 10/29/2013, 2:52 PM

## 2013-10-29 NOTE — Progress Notes (Signed)
PT Cancellation Note  Patient Details Name: [name]. MRN: [id number] DOB: 1931-08-02   Cancelled Treatment:    Reason Eval/Treat Not Completed: Medical issues which prohibited therapy--spoke with RN who recommended PT be held on today. Will check back another day. Thanks.    Weston Anna, MPT Pager: [phone]

## 2013-10-29 NOTE — Progress Notes (Signed)
Called MD [facility] and made aware of loose bloody stool, A-line status (Advised to D/C), and 10 am Metoprolol. Advised to hold if SBP less then 120. And no new orders placed for bloody stool. Will continue to monitor.

## 2013-10-29 NOTE — Consult Note (Signed)
WOC ostomy consult note Stoma type/location: RLQ Ileal conduit Stomal assessment/size: 1 and 1/4 inches round, red, moist, slightly budded and edematous.  Two stents intact: Red = right ureter, Blue = Left ureter Peristomal assessment: Intact, clear.  Medical adhesive related skin injury (MARSI, fracture blister) noted at lateral edge of skin barrier measuring 5cm x 0.4cm x 0.2cm are is clean, pink, moist Treatment options for stomal/peristomal skin: Skin barrier ring around stoma.  Covered area of MARSI with skin barreir Output clear yellow urine Ostomy pouching: 1pc.convex pouching system with skin barrier ring Education provided: Patient, son and daughter-in-law participated in initial teaching session.  Pouch preparation, pouching system removal and application.  Basic A&P, stoma characteristics, pouch characteristics and change frequency taught with good understanding.  Demonstrated application of nighttime drainage bag to pouch spout.  Provided literature and 1-page teaching sheet on pouch change. Patient will require support of [facility] post discharge.  If you agree, please order. [employer] nursing team will follow, and will remain available to this patient, the nursing, surgical and medical teams.  Thanks, Maudie Flakes, MSN, RN, [place], [place], [place] [phone])

## 2013-10-29 NOTE — Progress Notes (Addendum)
Noted A.Fib on cardiac monitor. MD Ellyn Hack at beside and made aware. No new orders placed. Will continue to monitor.

## 2013-10-29 NOTE — Progress Notes (Signed)
Spoke with MD [facility] about patient's loose, bloody stools. No new orders placed. Will continue to monitor.

## 2013-10-29 NOTE — Progress Notes (Signed)
3 Days Post-Op   Subjective:  1 - Bladder Cancer - s/p robotic cystoprostatecotmy with ICG sentinal + template lymphadenectomy and ilial conduit urinary diversion 10/26/2013. Path pending.  2 - Anemia - H/o continued gross hematuria up to day of surgery. 1 p RBC 7/24 intra-op. Hgb 7.0 7/27 with some tachycardia.   3 - Chest Pain, Atrial Fibrillation - extensive cardiac history s/p CABG previously. Developed some chest pain and arrythmia post-op. Cards following, Trop I negative, and echo encouraging.  4 - Disposition  Rehab - PT following will at least need HH, final recs pending. OT eval pending.   Today [name] is feeling good. Had BM yesterday and tolelrating clears. Also ambulated and out of bed most of afternoon yesterday.   Objective: Vital signs in last 24 hours: Temp:  [97.4 F (36.3 C)-98.9 F (37.2 C)] 98.2 F (36.8 C) (07/27 0400) Pulse Rate:  [49-110] 88 (07/27 0600) Resp:  [13-27] 17 (07/27 0554) BP: (84-146)/(40-85) 146/85 mmHg (07/27 0600) SpO2:  [91 %-100 %] 98 % (07/27 0554) Arterial Line BP: (87-166)/(38-68) 146/58 mmHg (07/27 0500) Weight:  [79.1 kg (174 lb 6.1 oz)] 79.1 kg (174 lb 6.1 oz) (07/27 0554) Last BM Date: 10/25/13  Intake/Output from previous day: 07/26 0701 - 07/27 0700 In: 2683.3 [P.O.:120; I.V.:2303.3; IV Piggyback:150] Out: 2605 [Urine:2145; Drains:460] Intake/Output this shift:    General appearance: alert, cooperative, appears stated age and sons at bedside Nose: Nares normal. Septum midline. Mucosa normal. No drainage or sinus tenderness. Throat: lips, mucosa, and tongue normal; teeth and gums normal Neck: supple, symmetrical, trachea midline Back: symmetric, no curvature. ROM normal. No CVA tenderness. Resp: non-labored on room air. Chest wall: no tenderness Cardio: tachycardia to 100 by bedside monitor.  GI: soft, non-tender; bowel sounds normal; no masses,  no organomegaly Male genitalia: normal Extremities: extremities normal,  atraumatic, no cyanosis or edema Pulses: 2+ and symmetric Skin: Skin color, texture, turgor normal. No rashes or lesions Lymph nodes: Cervical, supraclavicular, and axillary nodes normal. Neurologic: Grossly normal Incision/Wound: RLQ Urostomy pink / patent with banders in place. Midline incision and prior prior port sites c/d/i, On Q removed.   Lab Results:   Recent Labs  10/28/13 0500 10/29/13 0415  WBC 13.6* 13.3*  HGB 8.1* 7.0*  HCT 24.7* 21.2*  PLT 329 312   BMET  Recent Labs  10/28/13 0500 10/29/13 0415  NA 134* 135*  K 4.0 3.7  CL 101 102  CO2 22 22  GLUCOSE 134* 131*  BUN 13 11  CREATININE 1.53* 1.38*  CALCIUM 8.3* 8.1*   PT/INR No results found for this basename: LABPROT, INR,  in the last 72 hours ABG  Recent Labs  10/26/13 1540  PHART 7.296*  HCO3 20.3    Studies/Results: No results found.  Anti-infectives: Anti-infectives   Start     Dose/Rate Route Frequency Ordered Stop   10/27/13 1400  piperacillin-tazobactam (ZOSYN) IVPB 2.25 g  Status:  Discontinued     2.25 g 100 mL/hr over 30 Minutes Intravenous 3 times per day 10/27/13 0924 10/27/13 0938   10/27/13 1400  piperacillin-tazobactam (ZOSYN) IVPB 3.375 g  Status:  Discontinued     3.375 g 12.5 mL/hr over 240 Minutes Intravenous 3 times per day 10/27/13 0938 10/27/13 1031   10/27/13 1400  piperacillin-tazobactam (ZOSYN) IVPB 2.25 g  Status:  Discontinued     2.25 g 100 mL/hr over 30 Minutes Intravenous 3 times per day 10/27/13 1031 10/29/13 0811   10/26/13 2200  piperacillin-tazobactam (ZOSYN) IVPB  3.375 g  Status:  Discontinued     3.375 g 12.5 mL/hr over 240 Minutes Intravenous 3 times per day 10/26/13 1728 10/27/13 0924   10/26/13 1730  piperacillin-tazobactam (ZOSYN) IVPB 3.375 g  Status:  Discontinued     3.375 g 100 mL/hr over 30 Minutes Intravenous 3 times per day 10/26/13 1716 10/26/13 1726   10/26/13 0900  piperacillin-tazobactam (ZOSYN) IVPB 2.25 g     2.25 g 100 mL/hr over 30  Minutes Intravenous  Once 10/26/13 0853 10/26/13 0900   10/25/13 1215  piperacillin-tazobactam (ZOSYN) IVPB 2.25 g     2.25 g 100 mL/hr over 30 Minutes Intravenous  Once 10/25/13 1209 10/25/13 1505   10/25/13 1200  piperacillin-tazobactam (ZOSYN) IVPB 3.375 g  Status:  Discontinued    Comments:  Dr. Tresa Moore requests dosage be 2.25g IV x1   3.375 g 100 mL/hr over 30 Minutes Intravenous  Once 10/25/13 1156 10/25/13 1505      Assessment/Plan:   1 - Bladder Cancer - path pending, no wound issues. Removed on-Q today. Adv to reg diet today.   2 - Anemia - likely dilutional / equilibration. Discused with cardiology and we both agree to treat as some tachycardia w/o CP. Transfuse 2 units with lasix 20 between.   3 - Chest Pain, Atrial Fibrillation - appreciate cards input, has NTG avail.   4 - Disposition  Rehab - final dispo penidng. Remain ICU for now, hopefully to floor tomorrow.     [facility], [name] 10/29/2013

## 2013-10-29 NOTE — Progress Notes (Signed)
Patient completed first transfusion with no complaints of pain, vital signs within patient's normal, and asymptomatic. Will begin second transfusion shortly.

## 2013-10-30 ENCOUNTER — Encounter (HOSPITAL_COMMUNITY): Payer: Self-pay | Admitting: Cardiology

## 2013-10-30 DIAGNOSIS — N179 Acute kidney failure, unspecified: Secondary | ICD-10-CM

## 2013-10-30 DIAGNOSIS — N189 Chronic kidney disease, unspecified: Secondary | ICD-10-CM

## 2013-10-30 DIAGNOSIS — K921 Melena: Secondary | ICD-10-CM

## 2013-10-30 DIAGNOSIS — R195 Other fecal abnormalities: Secondary | ICD-10-CM | POA: Diagnosis not present

## 2013-10-30 DIAGNOSIS — I498 Other specified cardiac arrhythmias: Secondary | ICD-10-CM

## 2013-10-30 LAB — TYPE AND SCREEN
ABO/RH(D): AB POS
Antibody Screen: NEGATIVE
Unit division: 0
Unit division: 0

## 2013-10-30 LAB — BASIC METABOLIC PANEL
ANION GAP: 12 (ref 5–15)
BUN: 16 mg/dL (ref 6–23)
CO2: 23 meq/L (ref 19–32)
Calcium: 8.4 mg/dL (ref 8.4–10.5)
Chloride: 100 mEq/L (ref 96–112)
Creatinine, Ser: 1.38 mg/dL — ABNORMAL HIGH (ref 0.50–1.35)
GFR calc Af Amer: 54 mL/min — ABNORMAL LOW (ref >90)
GFR calc non Af Amer: 47 mL/min — ABNORMAL LOW (ref >90)
GLUCOSE: 110 mg/dL — AB (ref 70–99)
POTASSIUM: 4.1 meq/L (ref 3.7–5.3)
SODIUM: 135 meq/L — AB (ref 137–147)

## 2013-10-30 LAB — CBC
HCT: 29.5 % — ABNORMAL LOW (ref 39.0–52.0)
HEMOGLOBIN: 10 g/dL — AB (ref 13.0–17.0)
MCH: 27.5 pg (ref 26.0–34.0)
MCHC: 33.9 g/dL (ref 30.0–36.0)
MCV: 81.3 fL (ref 78.0–100.0)
PLATELETS: 387 10*3/uL (ref 150–400)
RBC: 3.63 MIL/uL — AB (ref 4.22–5.81)
RDW: 13.9 % (ref 11.5–15.5)
WBC: 13 10*3/uL — AB (ref 4.0–10.5)

## 2013-10-30 LAB — CREATININE, FLUID (PLEURAL, PERITONEAL, JP DRAINAGE): CREAT FL: 1.2 mg/dL

## 2013-10-30 MED ORDER — METOPROLOL TARTRATE 25 MG PO TABS
37.5000 mg | ORAL_TABLET | Freq: Two times a day (BID) | ORAL | Status: DC
  Administered 2013-10-30 – 2013-10-31 (×4): 37.5 mg via ORAL
  Filled 2013-10-30 (×7): qty 1

## 2013-10-30 NOTE — Progress Notes (Signed)
4 Days Post-Op  Subjective:  1 - Bladder Cancer - s/p robotic cystoprostatecotmy with ICG sentinal + template lymphadenectomy and ilial conduit urinary diversion 10/26/2013. Path pending. Adv to regular diet 7/27 as formed BM and no emesis.   2 - Anemia - H/o continued gross hematuria up to day of surgery. 1 p RBC 7/24 intra-op. Hgb 7.0 7/27 with some tachycardia. 2 more pRBC 7/27 for Hgb 8.   3 - Chest Pain, Atrial Fibrillation - extensive cardiac history s/p CABG previously. Developed some chest pain and arrythmia post-op. Cards following, Trop I negative, and echo encouraging.  4 - Disposition  Rehab - PT following will at least need HH, final recs pending. OT eval pending.   Today Rush is feeling good. Tollerating regular diet, pain controlled. NO CP/SOB.   Objective: Vital signs in last 24 hours: Temp:  [97.6 F (36.4 C)-98.6 F (37 C)] 98.1 F (36.7 C) (07/28 0400) Pulse Rate:  [35-105] 91 (07/28 0408) Resp:  [10-25] 15 (07/28 0408) BP: (86-181)/(46-96) 152/73 mmHg (07/28 0408) SpO2:  [94 %-100 %] 98 % (07/28 0408) Arterial Line BP: (107-148)/(58-65) 114/64 mmHg (07/27 1345) Weight:  [77.9 kg (171 lb 11.8 oz)] 77.9 kg (171 lb 11.8 oz) (07/28 0528) Last BM Date: 10/29/13  Intake/Output from previous day: 07/27 0701 - 07/28 0700 In: 1060.3 [P.O.:240; I.V.:460.3; Blood:360] Out: 2690 [Urine:1745; Drains:945] Intake/Output this shift:    General appearance: alert, cooperative, appears stated age and family at bedside Nose: Nares normal. Septum midline. Mucosa normal. No drainage or sinus tenderness. Throat: lips, mucosa, and tongue normal; teeth and gums normal Neck: supple, symmetrical, trachea midline Back: symmetric, no curvature. ROM normal. No CVA tenderness. Resp: non-labored Chest wall: no tenderness Cardio: HR 90s with some ectopy by bedisde monitor (stable) GI: soft, non-tender; bowel sounds normal; no masses,  no organomegaly Male genitalia: normal Extremities:  extremities normal, atraumatic, no cyanosis or edema Pulses: 2+ and symmetric Skin: Skin color, texture, turgor normal. No rashes or lesions Lymph nodes: Cervical, supraclavicular, and axillary nodes normal. Neurologic: Grossly normal Incision/Wound: Recent port / extraction sites c/d/i. JP with serous fluid.   Lab Results:   Recent Labs  10/29/13 0415 10/30/13 0345  WBC 13.3* 13.0*  HGB 7.0* 10.0*  HCT 21.2* 29.5*  PLT 312 387   BMET  Recent Labs  10/29/13 0415 10/30/13 0345  NA 135* 135*  K 3.7 4.1  CL 102 100  CO2 22 23  GLUCOSE 131* 110*  BUN 11 16  CREATININE 1.38* 1.38*  CALCIUM 8.1* 8.4   PT/INR No results found for this basename: LABPROT, INR,  in the last 72 hours ABG No results found for this basename: PHART, PCO2, PO2, HCO3,  in the last 72 hours  Studies/Results: No results found.  Anti-infectives: Anti-infectives   Start     Dose/Rate Route Frequency Ordered Stop   10/27/13 1400  piperacillin-tazobactam (ZOSYN) IVPB 2.25 g  Status:  Discontinued     2.25 g 100 mL/hr over 30 Minutes Intravenous 3 times per day 10/27/13 0924 10/27/13 0938   10/27/13 1400  piperacillin-tazobactam (ZOSYN) IVPB 3.375 g  Status:  Discontinued     3.375 g 12.5 mL/hr over 240 Minutes Intravenous 3 times per day 10/27/13 0938 10/27/13 1031   10/27/13 1400  piperacillin-tazobactam (ZOSYN) IVPB 2.25 g  Status:  Discontinued     2.25 g 100 mL/hr over 30 Minutes Intravenous 3 times per day 10/27/13 1031 10/29/13 0811   10/26/13 2200  piperacillin-tazobactam (ZOSYN) IVPB 3.375  g  Status:  Discontinued     3.375 g 12.5 mL/hr over 240 Minutes Intravenous 3 times per day 10/26/13 1728 10/27/13 0924   10/26/13 1730  piperacillin-tazobactam (ZOSYN) IVPB 3.375 g  Status:  Discontinued     3.375 g 100 mL/hr over 30 Minutes Intravenous 3 times per day 10/26/13 1716 10/26/13 1726   10/26/13 0900  piperacillin-tazobactam (ZOSYN) IVPB 2.25 g     2.25 g 100 mL/hr over 30 Minutes  Intravenous  Once 10/26/13 0853 10/26/13 0900   10/25/13 1215  piperacillin-tazobactam (ZOSYN) IVPB 2.25 g     2.25 g 100 mL/hr over 30 Minutes Intravenous  Once 10/25/13 1209 10/25/13 1505   10/25/13 1200  piperacillin-tazobactam (ZOSYN) IVPB 3.375 g  Status:  Discontinued    Comments:  Dr. Tresa Moore requests dosage be 2.25g IV x1   3.375 g 100 mL/hr over 30 Minutes Intravenous  Once 10/25/13 1156 10/25/13 1505      Assessment/Plan:  1 - Bladder Cancer - path pending, no wound issues. Check JP Cr today, will remove if same as serum.   2 - Anemia - likely dilutional / equilibration. Hgb responded approprieatly to 2 upRBC yesterday, monitor.   3 - Chest Pain, Atrial Fibrillation - appreciate cards input, has NTG avail.   4 - Disposition  Rehab - final dispo penidng. Transfer floor today.   [facility], Kiosha Buchan 10/30/2013

## 2013-10-30 NOTE — Progress Notes (Signed)
Came to visit at bedside to offer [facility] services. Patient states he is not sure if he is interested in [facility] or not at this time. States he will call if he becomes interested. Gave patient contact number and [facility] Care Management brochure for contact if interested in the future. Will make inpatient RNCM aware patient declined services at this time.  [facility] [phone]

## 2013-10-30 NOTE — Plan of Care (Signed)
Problem: Phase I Progression Outcomes Goal: Voiding-avoid urinary catheter unless indicated Outcome: Not Applicable Date Met:  91/44/45 Pt has an ileoconduit

## 2013-10-30 NOTE — Care Management Note (Addendum)
    Page 1 of 1   11/01/2013     10:42:22 AM CARE MANAGEMENT NOTE 11/01/2013  Patient:  [name], [name]   Account Number:  [id number]  Date Initiated:  10/30/2013  Documentation initiated by:  Dessa Phi  Subjective/Objective Assessment:   80 Y/O M ADMITTED W/BLADDER CA.     Action/Plan:   FROM HOME W/SPOUSE.   Anticipated DC Date:  11/01/2013   Anticipated DC Plan:  [place]  CM consult  Patient refused services      Choice offered to / List presented to:  C-1 Patient           Status of service:  Completed, signed off Medicare Important Message given?  NA - LOS <3 / Initial given by admissions (If response is "NO", the following Medicare IM given date fields will be blank) Date Medicare IM given:  10/30/2013 Medicare IM given by:  [facility] Date Additional Medicare IM given:  11/01/2013 Additional Medicare IM given by:  [facility]  Discharge Disposition:  HOME/SELF CARE  Per UR Regulation:  Reviewed for med. necessity/level of care/duration of stay  If discussed at [place] of Stay Meetings, dates discussed:   11/01/2013    Comments:  11/01/13 Renny Remer RN,BSN NCM [phone] D/C [place].  10/31/13 Angelie Kram RN,BSN NCM [phone] PT-HH.PATIENT DECLINES HHC-STATES HE WALKS INDEPENDENTLY.WOC-RLQ ILEAL CONDUIT-PATIENT STATES INDEP W/DSG CHANGE.NO ANTICIPATED D/C NEEDS.  10/30/13 Geneve Kimpel RN,BSN NCM [phone] TRANSFER FROM SDU.POD#4 CYSTOPROSTATECTOMY,JP DRAIN,REG DIET,BM.CARDIO-AFIB.PT-HH.WILL PROVIDE HHC AGENCY LIST.AWAIT CHOICE.AWAIT FINAL HHPT ORDER.

## 2013-10-30 NOTE — Progress Notes (Signed)
1800 dose of Crestor not available in pyxis or pt's med drawer. Pharmacy made aware and dose to be tubed to floor. Med did not arrive before change of shift. Will report to oncoming nurse.

## 2013-10-30 NOTE — Evaluation (Signed)
Occupational Therapy Evaluation Patient Details Name: [name]. MRN: [id number] DOB: [birth year] Today's Date: 10/30/2013    History of Present Illness Pt is 80 y.o. male admitted with bladder cancer and is s/p ROBOTIC ASSISTED LAPAROSCOPIC CYSTECTOMY AND PROSTECTOMY WITH NODE DISSECTION,  ILEAL CONDUIT.   Clinical Impression   Pt was admitted for the above surgery.  At baseline, he is independent with ADLs and is active on his farm.  He needs overall min guard for adls/min A for transfers.  He will benefit from skilled OT in acute to increase independence with basic ADLs and emphasize energy conservation.  Goals are set for supervision to mod I.    Follow Up Recommendations  No OT follow up    Equipment Recommendations  None recommended by OT    Recommendations for Other Services       Precautions / Restrictions Precautions Precautions: Fall Precaution Comments: multiple lines/drains, onQ Restrictions Weight Bearing Restrictions: No      Mobility Bed Mobility       Sit to supine:  Min guard for safety        Transfers Overall transfer level: Needs assistance Equipment used: 1 person hand held assist Transfers: Sit to/from Stand Sit to Stand: Min guard         General transfer comment: used iv pole to walk to bathroom    Balance Overall balance assessment: Needs assistance                                          ADL Overall ADL's : Needs assistance/impaired     Grooming: Supervision/safety;Standing   Upper Body Bathing: Set up;Sitting   Lower Body Bathing: Min guard;Sit to/from stand   Upper Body Dressing : Set up;Sitting   Lower Body Dressing: Sit to/from stand;Min guard   Toilet Transfer: Ambulation;Comfort height toilet;Minimal assistance   Toileting- Clothing Manipulation and Hygiene: Sit to/from stand;Min guard   Tub/ Shower Transfer: Ambulation;Minimal assistance (simulated tub)     General ADL Comments:  ambulated to bathroom; simulated tub--recommended pt hold onto wall by shower head when stepping in.  He has a grab bar on far wall.  Pt tends to move quickly.  Used IV pole for support.  Educated on energy conservation especially rest breaks and breaking up activities.  Pt verbalizes understanding.  Pt wanted to walk more:  PT was with him earlier and he was limited by diarrhea.  Unable to reach therapist by phone so walked with pt in the hall using RW.  Son present and assisted:  Very involved.  Min A given for safety, especially turns.  HR 83-101      Vision                     Perception     Praxis      Pertinent Vitals/Pain No c/o pain     Hand Dominance Right   Extremity/Trunk Assessment Upper Extremity Assessment Upper Extremity Assessment: Overall WFL for tasks assessed           Communication Communication Communication: No difficulties   Cognition Arousal/Alertness: Awake/alert Behavior During Therapy: WFL for tasks assessed/performed Overall Cognitive Status: Within Functional Limits for tasks assessed                     General Comments       Exercises  Shoulder Instructions      Home Living Family/patient expects to be discharged to:: Private residence Living Arrangements: Spouse/significant other Available Help at Discharge: Available 24 hours/day;Family Type of Home: House Home Access: Stairs to enter [employer] of Steps: 1   Home Layout: One level     Bathroom Shower/Tub: Tub/shower unit Shower/tub characteristics: Architectural technologist: Handicapped height     Home Equipment: [place] - single point          Prior Functioning/Environment Level of Independence: Independent        Comments: Pt drives, 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     OT Diagnosis: Generalized weakness   OT Problem List: Decreased strength;Decreased activity tolerance;Decreased knowledge of use of  DME or AE;Pain;Impaired balance (sitting and/or standing)   OT Treatment/Interventions: Self-care/ADL training;DME and/or AE instruction;Balance training;Patient/family education;Energy conservation    OT Goals(Current goals can be found in the care plan section) Acute Rehab OT Goals OT Goal Formulation: With patient Time For Goal Achievement: 11/13/13 Potential to Achieve Goals: Good ADL Goals Pt Will Transfer to Toilet: with modified independence;ambulating (high commode with sink) Additional ADL Goal #1: pt will verbalize 3 energy conservation strategies Additional ADL Goal #2: Pt will gather clothes at supervision level  OT Frequency: Min 2X/week   Barriers to D/C:            Co-evaluation              End of Session    Activity Tolerance: Patient tolerated treatment well Patient left: in chair;with call bell/phone within reach;with family/visitor present (then back to bed after walking in hall)   Time: 7116-5790 (walked with pt:  [id number]) OT Time Calculation (min): 15 min Charges:  OT General Charges $OT Visit: 1 Procedure OT Evaluation $Initial OT Evaluation Tier I: 1 Procedure OT Treatments $Self Care/Home Management : 8-22 mins G-Codes:    [name] 2013/11/13, 3:35 PM  Lesle Chris, OTR/L [phone] 11/13/2013

## 2013-10-30 NOTE — Progress Notes (Addendum)
Patient ID: Kenneth Solian., male   DOB: 12-31-1931, 80 y.o.   MRN: [id number]  Date of Encounter: 10/30/2013   Principal Problem:   Bladder cancer Active Problems:   CAD in native artery = CABG in 1983; mild inferior ischemia on 2010 Nuc ST.   Abnormal finding on EKG - diffuse ST-Depressio   Acute post-hemorrhagic anemia   CKD (chronic kidney disease), stage III   Frequent PVCs   Atrial fibrillation with RVR vs. WAP/MAT (with reduced HR - EKG appears to be WAP & not Afib   Hematochezia   ATHEROSCLEROSIS, CORONARY, ARTERY BYPASS GRFT   Anemia   Peripheral vascular disease   Mild aortic stenosis by prior echocardiogram   SUBJECTIVE  2 U PRBC yesterday  Apparent Afib on Tele yesterday more likely to be MAT as EKG was more consistent with Wandering Atrial Pacemaker & not Afib   Had episode of ~hematochezia in afternoon & another ~1 AM this AM, also mild blood with flatulence.  -- Feels OK today - no sense of irregular Heartbeats. No further angina. Ambulated with PT.  CURRENT MEDS . antiseptic oral rinse  15 mL Mouth Rinse q12n4p  . aspirin  81 mg Oral Daily  . cholecalciferol  1,000 Units Oral Daily  . isosorbide mononitrate  60 mg Oral Daily  . metoprolol tartrate  37.5 mg Oral BID  . omeprazole  40 mg Oral Daily  . rosuvastatin  10 mg Oral q1800  . senna-docusate  2 tablet Oral QHS    OBJECTIVE  Filed Vitals:   10/30/13 0241 10/30/13 0400 10/30/13 0408 10/30/13 0528  BP: 139/81 170/60 152/73   Pulse: 35 92 91   Temp:  98.1 F (36.7 C)    TempSrc:  Oral    Resp: 13 21 15    Height:      Weight:    171 lb 11.8 oz (77.9 kg)  SpO2: 96% 97% 98%     Intake/Output Summary (Last 24 hours) at 10/30/13 0728 Last data filed at 10/30/13 0600  Gross per 24 hour  Intake 1060.3 ml  Output   2690 ml  Net -1629.7 ml   Filed Weights   10/28/13 0001 10/29/13 0554 10/30/13 0528  Weight: 172 lb 9.9 oz (78.3 kg) 174 lb 6.1 oz (79.1 kg) 171 lb 11.8 oz (77.9 kg)    PHYSICAL  EXAM General: Pleasant, NAD. Neuro: Alert and oriented X 3. Moves all extremities spontaneously. Psych: Normal affect. HEENT:  Normal  Neck: Supple without bruits or JVD. Lungs:  Resp regular and unlabored, CTA. Heart: Irreg Irreg with RR. Grade 2/6 aortic stenosis murmur Abdomen: Soft, non-tender, non-distended, BS + x 4.  Extremities: No clubbing, cyanosis or edema. DP/PT/Radials 2+ and equal bilaterally.  Accessory Clinical Findings  CBC  Recent Labs  10/29/13 0415 10/30/13 0345  WBC 13.3* 13.0*  HGB 7.0* 10.0*  HCT 21.2* 29.5*  MCV 81.9 81.3  PLT 312 242   Basic Metabolic Panel  Recent Labs  10/29/13 0415 10/30/13 0345  NA 135* 135*  K 3.7 4.1  CL 102 100  CO2 22 23  GLUCOSE 131* 110*  BUN 11 16  CREATININE 1.38* 1.38*  CALCIUM 8.1* 8.4   Liver Function Tests No results found for this basename: AST, ALT, ALKPHOS, BILITOT, PROT, ALBUMIN,  in the last 72 hours No results found for this basename: LIPASE, AMYLASE,  in the last 72 hours Cardiac Enzymes  Recent Labs  10/27/13 [place] <0.30  TELE Afib vs. WAP with rates 90s    ECG yesterday read as Wandering Atrial Pacemaker ([facility]) Echo 7/25: Normal EF 60-65% without WMA. Gr2 Diastolic Dysfxn., Mild AS  Radiology/Studies - no new  ASSESSMENT AND PLAN Principal Problem:   Bladder cancer Active Problems:   CAD in native artery = CABG in 1983; mild inferior ischemia on 2010 Nuc ST.   Abnormal finding on EKG - diffuse ST-Depressio   Acute post-hemorrhagic anemia   CKD (chronic kidney disease), stage III   Frequent PVCs   Atrial fibrillation with RVR vs. WAP/MAT (with reduced HR - EKG appears to be WAP & not Afib   Hematochezia   ATHEROSCLEROSIS, CORONARY, ARTERY BYPASS GRFT   Anemia   Peripheral vascular disease   Mild aortic stenosis by prior echocardiogram  Plan: For now would opt for rate control with BB; probably related to anemia, but plans are in place for OP ischemic evaluation.   Lack of angina with MAT & anemia is reassuring.  Less likely inclined to pursue cath unless intractable angina given persistent & worsening anemia.    Continue metoprolol - changed to 37.5 mb BID; On PO Imdur.  -- Tele still looks more like Afib - recheck EKG today  Anemia could also be related to supply vs. Demand ischemia mediated angina (this would go along with possible ischemic ECG changes intraop)  Hgb with appropriate "bump" post transfusion - now with Hgb ~10.0 (Good UOP after transfusion)  Hematochezia yesterday -- defer to primary Svc, but? GI consult.    No anticoagulation would be recommended as yesterday's arrhythmia was more likely MAT/WAP than Afib  If HR increases - would use CCB gtt, but for now will use BB (IV load x 1 5mg ).  BP much improved.  Renal Fxn has improved Advance activity. Anticipate transfer to Tele.  Signed, Leonie Man, M.D., M.S. Interventional Cardiologist   Pager # [phone] 10/30/2013

## 2013-10-30 NOTE — Progress Notes (Signed)
Physical Therapy Treatment Patient Details Name: [name]. MRN: [id number] DOB: 11-Sep-1931 Today's Date: 10/30/2013    History of Present Illness Pt is 80 y.o. male admitted with bladder cancer and is s/p ROBOTIC ASSISTED LAPAROSCOPIC CYSTECTOMY AND PROSTECTOMY WITH NODE DISSECTION,  ILEAL CONDUIT.    PT Comments    Pt very motivated to mobilize.  Continue PT while in acute care. Son to get [employer] for walking  Follow Up Recommendations  Home health PT;Supervision for mobility/OOB     Equipment Recommendations  Rolling walker with 5" wheels    Recommendations for Other Services       Precautions / Restrictions Precautions Precautions: Fall Precaution Comments: multiple lines/drains, onQ    Mobility  Bed Mobility   Bed Mobility: Supine to Sit     Supine to sit: Min assist;HOB elevated     General bed mobility comments: use of rail, cues for safety  Transfers Overall transfer level: Needs assistance Equipment used: 1 person hand held assist Transfers: Sit to/from Stand Sit to Stand: Min assist         General transfer comment: pt used rail and held [name]  Ambulation/Gait Ambulation/Gait assistance: Museum/gallery curator (Feet): 5 Feet         General Gait Details: limited due to diarrhea   Stairs            Wheelchair Mobility    Modified Rankin (Stroke Patients Only)       Balance Overall balance assessment: Needs assistance                                  Cognition Arousal/Alertness: Awake/alert Behavior During Therapy: WFL for tasks assessed/performed                        Exercises      General Comments        Pertinent Vitals/Pain L side sore at drain    Home Living                      Prior Function            PT Goals (current goals can now be found in the care plan section) Progress towards PT goals: Progressing toward goals    Frequency  Min 3X/week    PT  Plan Current plan remains appropriate    Co-evaluation             End of Session   Activity Tolerance: Patient tolerated treatment well Patient left: in chair;with family/visitor present;with nursing/sitter in room     Time: 3254-9826 PT Time Calculation (min): 21 min  Charges:  $Gait Training: 8-22 mins                    G Codes:      Claretha Cooper 10/30/2013, 12:49 PM Tresa Endo PT [phone]

## 2013-10-31 MED ORDER — VITAMINS A & D EX OINT
TOPICAL_OINTMENT | CUTANEOUS | Status: AC
  Administered 2013-10-31: 20:00:00
  Filled 2013-10-31: qty 5

## 2013-10-31 NOTE — Consult Note (Signed)
WOC ostomy follow up Stoma type/location: RLQ ileal conduit Stomal assessment/size: 1 and 1/4 inch when measured last on Monday  Peristomal assessment: not seen today Treatment options for stomal/peristomal skin:  Output clear yellow urine Ostomy pouching: 1pc. Pouching system with skin barrier ring intact.  Education provided: Extended session with 2 sons present for education regarding use of the bedside drainage bag.  Demonstration of attaching to bedside drainage bag provided, also care of equipment at home discussed. Enrolled patient in [facility] Start Discharge program: Yes [facility] nursing team will follow, andwill remain available to this patient, the nursing, urology and medical teams.  Plan to see early in am for pouch change following a shower if Dr. Tresa Moore agrees that a shower is possible prior to discharge. Thanks, Maudie Flakes, MSN, RN, [place], [place], [place] [phone])

## 2013-10-31 NOTE — Progress Notes (Signed)
    Subjective:  No further chest pain. No dyspnea.   Objective:  Vital Signs in the last 24 hours: Temp:  [97.5 F (36.4 C)-98.1 F (36.7 C)] 98.1 F (36.7 C) (07/29 0441) Pulse Rate:  [50-96] 50 (07/29 0441) Resp:  [16-19] 19 (07/29 0441) BP: (98-159)/(62-88) 155/88 mmHg (07/29 0441) SpO2:  [97 %-100 %] 97 % (07/29 0441) Weight:  [171 lb 11.8 oz (77.9 kg)] 171 lb 11.8 oz (77.9 kg) (07/28 1036)  Intake/Output from previous day: 07/28 0701 - 07/29 0700 In: 720 [P.O.:240; I.V.:480] Out: 2765 [Urine:2305; Drains:460]  Physical Exam: Pt is alert and oriented, pleasant male in NAD HEENT: normal Neck: JVP - normal, carotids 2+= without bruits Lungs: CTA bilaterally CV: irregular with grade 2/6 harsh systolic murmur at the LSB Abd: soft, mild diffuse tenderness without rebound or guarding Ext: no C/C/E, distal pulses intact and equal Skin: warm/dry no rash   Lab Results:  Recent Labs  10/29/13 0415 10/30/13 0345  WBC 13.3* 13.0*  HGB 7.0* 10.0*  PLT 312 387    Recent Labs  10/29/13 0415 10/30/13 0345  NA 135* 135*  K 3.7 4.1  CL 102 100  CO2 22 23  GLUCOSE 131* 110*  BUN 11 16  CREATININE 1.38* 1.38*   No results found for this basename: TROPONINI, CK, MB,  in the last 72 hours  Cardiac Studies: 2D Echo: Study Conclusions  - Left ventricle: The cavity size was normal. Wall thickness was increased in a pattern of mild LVH. Systolic function was normal. The estimated ejection fraction was in the range of 60% to 65%. Wall motion was normal; there were no regional wall motion abnormalities. Features are consistent with a pseudonormal left ventricular filling pattern, with concomitant abnormal relaxation and increased filling pressure (grade 2 diastolic dysfunction). - Aortic valve: Valve mobility was restricted. There was mild stenosis. - Mitral valve: Calcified annulus. There was mild regurgitation. - Left atrium: The atrium was severely dilated. -  Right ventricle: The cavity size was mildly dilated. - Right atrium: The atrium was mildly dilated. - Pulmonary arteries: Systolic pressure was mildly increased. PA peak pressure: 40 mm Hg (S).  Impressions:  - Normal LV function; grade 2 diastolic dysfunction; biatrial enlargement; mild RVE; mild AS.  Tele: Atrial fibrillation heart rate controlled in 70-80's  Assessment/Plan:  1. CAD s/p remote CABG, angina resolved with correction of anemia 2. Atrial fibrillation, new onset 3. Acute blood loss anemia/hematuria 4. CKD Stage 3  Pt has stabilized from cardiac perspective. Echo shows normal LV systolic function. Agree with med Rx (ASA, metoprolol, imdur). Heart rate controlled on metoprolol. Pt not a candidate for anticoagulation but this should be considered as an outpatient after he has recovered from surgery and H/H stabilized.  Sherren Mocha, M.D. 10/31/2013, 6:40 AM

## 2013-10-31 NOTE — Progress Notes (Addendum)
5 Days Post-Op  Subjective:  1 - Bladder Cancer - s/p robotic cystoprostatecotmy with ICG sentinal + template lymphadenectomy and ilial conduit urinary diversion 10/26/2013. Path pending. Adv to regular diet 7/27 as formed BM and no emesis.   2 - Anemia - H/o continued gross hematuria up to day of surgery. 1 p RBC 7/24 intra-op. Hgb 7.0 7/27 with some tachycardia. 2 more pRBC 7/27 for Hgb 8. Now hgb 10.  3 - Chest Pain, Atrial Fibrillation - extensive cardiac history s/p CABG previously. Developed some chest pain and arrythmia post-op. Cards following, Trop I negative, and echo encouraging. May need anticoagulation as outpatient.  4 - Disposition  Rehab - PT following will at least need HH, final recs pending. OT eval pending.   Today [place] is feeling good. Tollerating regular diet, pain controlled. Ambulating. NO CP/SOB.   Objective: Vital signs in last 24 hours: Temp:  [97.5 F (36.4 C)-98.1 F (36.7 C)] 98.1 F (36.7 C) (07/29 0441) Pulse Rate:  [50-96] 50 (07/29 0441) Resp:  [16-19] 19 (07/29 0441) BP: (98-159)/(62-88) 155/88 mmHg (07/29 0441) SpO2:  [97 %-100 %] 97 % (07/29 0441) Weight:  [77.9 kg (171 lb 11.8 oz)] 77.9 kg (171 lb 11.8 oz) (07/28 1036) Last BM Date: 10/30/13  Intake/Output from previous day: 07/28 0701 - 07/29 0700 In: 720 [P.O.:240; I.V.:480] Out: 2765 [Urine:2305; Drains:460] Intake/Output this shift: Total I/O In: 120 [P.O.:120] Out: -   General appearance: alert, cooperative, appears stated age and family at bedside Nose: Nares normal. Septum midline. Mucosa normal. No drainage or sinus tenderness. Throat: lips, mucosa, and tongue normal; teeth and gums normal Neck: supple, symmetrical, trachea midline Back: symmetric, no curvature. ROM normal. No CVA tenderness. Resp: non-labored Chest wall: no tenderness Cardio: HR 90s with some ectopy by bedisde monitor (stable) GI: soft, non-tender; bowel sounds normal; no masses,  no organomegaly Male  genitalia: normal Extremities: extremities normal, atraumatic, no cyanosis or edema Pulses: 2+ and symmetric Skin: Skin color, texture, turgor normal. No rashes or lesions Lymph nodes: Cervical, supraclavicular, and axillary nodes normal. Neurologic: Grossly normal Incision/Wound: Recent port / extraction sites c/d/i. JP with serous fluid.  Some erythema laterally on ostomy appliance. Cream has been applied. Stents in place.  Lab Results:   Recent Labs  10/29/13 0415 10/30/13 0345  WBC 13.3* 13.0*  HGB 7.0* 10.0*  HCT 21.2* 29.5*  PLT 312 387   BMET  Recent Labs  10/29/13 0415 10/30/13 0345  NA 135* 135*  K 3.7 4.1  CL 102 100  CO2 22 23  GLUCOSE 131* 110*  BUN 11 16  CREATININE 1.38* 1.38*  CALCIUM 8.1* 8.4   PT/INR No results found for this basename: LABPROT, INR,  in the last 72 hours ABG No results found for this basename: PHART, PCO2, PO2, HCO3,  in the last 72 hours  Studies/Results: No results found.  Anti-infectives: Anti-infectives   Start     Dose/Rate Route Frequency Ordered Stop   10/27/13 1400  piperacillin-tazobactam (ZOSYN) IVPB 2.25 g  Status:  Discontinued     2.25 g 100 mL/hr over 30 Minutes Intravenous 3 times per day 10/27/13 0924 10/27/13 0938   10/27/13 1400  piperacillin-tazobactam (ZOSYN) IVPB 3.375 g  Status:  Discontinued     3.375 g 12.5 mL/hr over 240 Minutes Intravenous 3 times per day 10/27/13 0938 10/27/13 1031   10/27/13 1400  piperacillin-tazobactam (ZOSYN) IVPB 2.25 g  Status:  Discontinued     2.25 g 100 mL/hr over 30 Minutes  Intravenous 3 times per day 10/27/13 1031 10/29/13 0811   10/26/13 2200  piperacillin-tazobactam (ZOSYN) IVPB 3.375 g  Status:  Discontinued     3.375 g 12.5 mL/hr over 240 Minutes Intravenous 3 times per day 10/26/13 1728 10/27/13 0924   10/26/13 1730  piperacillin-tazobactam (ZOSYN) IVPB 3.375 g  Status:  Discontinued     3.375 g 100 mL/hr over 30 Minutes Intravenous 3 times per day 10/26/13 1716  10/26/13 1726   10/26/13 0900  piperacillin-tazobactam (ZOSYN) IVPB 2.25 g     2.25 g 100 mL/hr over 30 Minutes Intravenous  Once 10/26/13 0853 10/26/13 0900   10/25/13 1215  piperacillin-tazobactam (ZOSYN) IVPB 2.25 g     2.25 g 100 mL/hr over 30 Minutes Intravenous  Once 10/25/13 1209 10/25/13 1505   10/25/13 1200  piperacillin-tazobactam (ZOSYN) IVPB 3.375 g  Status:  Discontinued    Comments:  Dr. Tresa Moore requests dosage be 2.25g IV x1   3.375 g 100 mL/hr over 30 Minutes Intravenous  Once 10/25/13 1156 10/25/13 1505      Assessment/Plan:  1 - Bladder Cancer - path pending, no wound issues. JP = serum 7/28. Removed.  2 - Anemia - likely dilutional / equilibration. Hgb responded approprieatly to 2 upRBC yesterday, monitor.   3 - Chest Pain, Atrial Fibrillation - appreciate cards input, has NTG avail.   4 - Disposition  Rehab - final dispo penidng. Likely home tomorrow. Pt feels that no HHRN will be necessary.  Margo Aye 10/31/2013  I have seen and examined the patient and agree with above assessment and plan. Path shows T2N0 disease which was discussed with patient and family.  1- Plan for home tomorrow, SLIV. 2 - Appreciate case manager help to arrange for [facility] for ostomy reinforcment and PT if needed.

## 2013-11-01 MED ORDER — OXYCODONE-ACETAMINOPHEN 5-325 MG PO TABS: 1.0000 | ORAL_TABLET | Freq: Four times a day (QID) | ORAL | Status: DC | PRN

## 2013-11-01 MED ORDER — SENNOSIDES-DOCUSATE SODIUM 8.6-50 MG PO TABS: 1.0000 | ORAL_TABLET | Freq: Every evening | ORAL | Status: DC | PRN

## 2013-11-01 NOTE — Discharge Summary (Signed)
Physician Discharge Summary  Patient ID: [name]. MRN: [id number] DOB/AGE: Jun 03, 1931 80 y.o.  Admit date: 10/25/2013 Discharge date: 11/01/2013  Admission Diagnoses: Bladder Cancer  Discharge Diagnoses:  Principal Problem:   Bladder cancer Active Problems:   ATHEROSCLEROSIS, CORONARY, ARTERY BYPASS GRFT   Acute post-hemorrhagic anemia   CAD in native artery = CABG in 1983; mild inferior ischemia on 2010 Nuc ST.   CKD (chronic kidney disease), stage III   Anemia   Peripheral vascular disease   Abnormal finding on EKG - diffuse ST-Depressio   Frequent PVCs   Atrial fibrillation with RVR vs. WAP/MAT (with reduced HR - EKG initiallly WAP, f/u EKG is Afib (New Afib Diagnosis)   Mild aortic stenosis by prior echocardiogram   Hematochezia   Discharged Condition: good  Hospital Course:   1 - Bladder Cancer - s/p robotic cystoprostatecotmy with ICG sentinal + template lymphadenectomy and ilial conduit urinary diversion 10/26/2013 for [id number] transitional cell carcinoma. Adv to regular diet 7/27 as formed BM and no emesis.   2 - Anemia - H/o continued gross hematuria up to day of surgery. 1 p RBC 7/24 intra-op. Hgb 7.0 7/27 with some tachycardia. 2 more pRBC 7/27 for Hgb 8. NHgb 10 and stable at discharge.  3 - Chest Pain, Atrial Fibrillation - extensive cardiac history s/p CABG previously. Developed some chest pain and arrythmia post-op. Trop I negative, and echo encouraging. Will f/u as outpatient with cardiology.  4 - Disposition Rehab - Pt did very well with in house physical therapy and felt to be adequate to DC home only with rolling walker. Is establish with ostomy RN for supplies.    Consults: cardiology, wound-osotmy  Significant Diagnostic Studies: labs: Hgb 10 at discharge, pathology as per above  Treatments: surgery:  robotic cystoprostatecotmy with ICG sentinal + template lymphadenectomy and ilial conduit urinary diversion 10/26/2013   Discharge Exam: Blood  pressure 115/80, pulse 62, temperature 98.2 F (36.8 C), temperature source Oral, resp. rate 18, height 6' (1.829 m), weight 77.9 kg (171 lb 11.8 oz), SpO2 98.00%. General appearance: alert, cooperative and appears stated age Eyes: conjunctivae/corneas clear. PERRL, EOM's intact. Fundi benign. Nose: Nares normal. Septum midline. Mucosa normal. No drainage or sinus tenderness. Throat: lips, mucosa, and tongue normal; teeth and gums normal Neck: supple, symmetrical, trachea midline Back: symmetric, no curvature. ROM normal. No CVA tenderness. Resp: non-labored on room air Chest wall: no tenderness Cardio: Nl rate GI: soft, non-tender; bowel sounds normal; no masses,  no organomegaly Male genitalia: normal Extremities: extremities normal, atraumatic, no cyanosis or edema Pulses: 2+ and symmetric Skin: Skin color, texture, turgor normal. No rashes or lesions Lymph nodes: Cervical, supraclavicular, and axillary nodes normal. Neurologic: Grossly normal Incision/Wound: Recent port sites and extraction sites c/d/i. RLQ URostomy pink and patetn with bander stents in situ.   Disposition: 01-Home or Self Care     Medication List         cholecalciferol 1000 UNITS tablet  Commonly known as:  VITAMIN D  Take 1,000 Units by mouth daily.     hydroxypropyl methylcellulose 2.5 % ophthalmic solution  Commonly known as:  ISOPTO TEARS  Place 1 drop into both eyes 3 (three) times daily as needed for dry eyes.     omeprazole 40 MG capsule  Commonly known as:  PRILOSEC  Take 40 mg by mouth daily.     oxyCODONE-acetaminophen 5-325 MG per tablet  Commonly known as:  ROXICET  Take 1-2 tablets by mouth every 6 (six)  hours as needed for moderate pain or severe pain. Post-operatively     senna-docusate 8.6-50 MG per tablet  Commonly known as:  Senokot-S  Take 1 tablet by mouth at bedtime as needed for mild constipation. While taking pain meds.           Follow-up Information   Follow up with  Alexis Frock, MD On 11/05/2013. (at 10:30 AM for MD visit)    Specialty:  Urology   Contact information:   [address] [place] [address] [phone]       Signed: Alexis Frock 11/01/2013, 7:49 AM

## 2013-11-01 NOTE — Discharge Instructions (Signed)

## 2013-11-01 NOTE — Progress Notes (Signed)
   Principal Problem:   Bladder cancer Active Problems:   CAD in native artery = CABG in 1983; mild inferior ischemia on 2010 Nuc ST.   Abnormal finding on EKG - diffuse ST-Depressio   Acute post-hemorrhagic anemia   CKD (chronic kidney disease), stage III   Frequent PVCs   Atrial fibrillation with RVR vs. WAP/MAT (with reduced HR - EKG initiallly WAP, f/u EKG is Afib (New Afib Diagnosis)   Hematochezia   ATHEROSCLEROSIS, CORONARY, ARTERY BYPASS GRFT   Anemia   Peripheral vascular disease   Mild aortic stenosis by prior echocardiogram  Subjective:  No further chest pain. No dyspnea.   Objective:  Vital Signs in the last 24 hours: Temp:  [98.1 F (36.7 C)-98.3 F (36.8 C)] 98.2 F (36.8 C) (07/30 0500) Pulse Rate:  [62-102] 62 (07/30 0500) Resp:  [18] 18 (07/30 0500) BP: (115-121)/(55-80) 115/80 mmHg (07/30 0500) SpO2:  [97 %-100 %] 98 % (07/30 0500)  Intake/Output from previous day: 07/29 0701 - 07/30 0700 In: 360 [P.O.:360] Out: 2600 [Urine:2600]  Physical Exam: Pt is alert and oriented, pleasant male in NAD HEENT: normal Neck: JVP - normal, carotids 2+= without bruits Lungs: CTA bilaterally CV: RRR grade 2/6 harsh systolic murmur at the LSB Abd: soft, mild diffuse tenderness without rebound or guarding Ext: no C/C/E, distal pulses intact and equal Skin: warm/dry no rash  Lab Results:  Recent Labs  10/30/13 0345  WBC 13.0*  HGB 10.0*  PLT 387    Recent Labs  10/30/13 0345  NA 135*  K 4.1  CL 100  CO2 23  GLUCOSE 110*  BUN 16  CREATININE 1.38*   No results found for this basename: TROPONINI, CK, MB,  in the last 72 hours  Cardiac Studies: 2D Echo: Study Conclusions: mld LVH, EF 60-65%, Grade 2 DD, mild AS, mild MR, Severe LA dilation, PAP ~40 mmHg.  Tele: NSR with PVCs (often in [place])  Assessment/Plan:  1. CAD s/p remote CABG, angina resolved with correction of anemia; on BB & Imdur 2. Atrial fibrillation, new onset - rate controlled &  converted to NSR 3. Acute blood loss anemia/hematuria; Hgb stable after transfusion. 4. CKD Stage 3 - renal Fxn stable  5. Mild AS  Pt has stabilized from cardiac perspective. Echo shows normal LV systolic function. Agree with med Rx (ASA, metoprolol, imdur). Heart rate controlled on current dose of metoprolol.  Pt not currently a candidate for anticoagulation (hematuria & hematochezia) but this should be considered as an outpatient after he has recovered from surgery and H/H stabilized. & would also consider OP Ischemic evaluation if Dr. Rockey Situ agrees.  Otherwise stable for d/c from Cardiac perspective.  Will need close f/u with Dr. Rockey Situ [facility], may be with NP/PA).  Leonie Man, M.D. 11/01/2013, 7:18 AM

## 2013-11-01 NOTE — Consult Note (Signed)
WOC ostomy follow up Stoma type/location: RLQ ileal conduit. Stomal assessment/size: 1 and 1/4 inches round, edematous, moist.  Two stents in place. Peristomal assessment: intact, clear Treatment options for stomal/peristomal skin: Skin barrier ring.  Small linear area of MARSI is healing well.  Covered today with a hydrocolloid dressing. Output clear yellow urine Ostomy pouching: 1pc.convexpouching system with skin barrier ring Education provided: Patient and son participating in teaching session today.  Discussed emergent situations:  That mucous is normal and that there are only two ways to collect a urine sample from a urostomy.  Some traced and cut out ostomy pouching system, I assist him with the placement of the pouch on top of the skin barrier ring.  Many questions answered. Patient is ready for discharge. Enrolled patient in [place] Start Discharge program: Yes Thanks you for allowing me to see and assist this nice gentleman. Thanks, Maudie Flakes, MSN, RN, [place], [place], [place] [phone])

## 2013-12-03 ENCOUNTER — Telehealth: Payer: Self-pay

## 2013-12-03 NOTE — Telephone Encounter (Signed)
Pt recently had an ileostomy and needs supplies for after care as soon as possible--the order form requiring signature is placed in your inbox--please let me know when this is ready--thanks as Dr Damita Dunnings is out of the office

## 2013-12-03 NOTE — Telephone Encounter (Signed)
Spoke with patient and he said stoma size was ~1.25 in. Form completed and faxed.

## 2013-12-03 NOTE — Telephone Encounter (Signed)
Filed and placed in Kim's box. - do we have stoma size (required by form)?

## 2013-12-21 ENCOUNTER — Encounter: Payer: Self-pay | Admitting: Family Medicine

## 2013-12-21 ENCOUNTER — Ambulatory Visit (INDEPENDENT_AMBULATORY_CARE_PROVIDER_SITE_OTHER): Payer: Commercial Managed Care - HMO | Admitting: Family Medicine

## 2013-12-21 VITALS — BP 120/70 | HR 74 | Temp 98.5°F | Ht 73.0 in | Wt 153.0 lb

## 2013-12-21 DIAGNOSIS — J309 Allergic rhinitis, unspecified: Secondary | ICD-10-CM

## 2013-12-21 DIAGNOSIS — C679 Malignant neoplasm of bladder, unspecified: Secondary | ICD-10-CM

## 2013-12-21 DIAGNOSIS — Z23 Encounter for immunization: Secondary | ICD-10-CM

## 2013-12-21 MED ORDER — FLUTICASONE PROPIONATE 50 MCG/ACT NA SUSP: 2.0000 | Freq: Every day | NASAL | Status: DC

## 2013-12-21 MED ORDER — FEXOFENADINE HCL 180 MG PO TABS: 180.0000 mg | ORAL_TABLET | Freq: Every day | ORAL | Status: DC

## 2013-12-21 NOTE — Progress Notes (Signed)
Pre visit review using our clinic review tool, if applicable. No additional management support is needed unless otherwise documented below in the visit note.  S/p bladder surgery.  Extensive resection, now with urostomy.  Recently UTI sx resolved.  D/w pt about adjustment after the surgery.  His stoma is healed well.  Good bag fit, etc.  Nasal congestion present since surgery 2015.  No fevers now.  Recently done with cipro for UTI.  No abd pain like he had with the UTI.  No ST.  Had not taken allegra recently.  H/o fall allergies.    Meds, vitals, and allergies reviewed.   ROS: See HPI.  Otherwise, noncontributory.  nad ncat Tm wnl Nasal exam stuffy Sinuses not ttp  OP wnl Neck supple, no LA rrr ctab Abd soft, urostomy in place No cva pain

## 2013-12-21 NOTE — Patient Instructions (Signed)
Use Allegra (not allegra D). If not better, then add on nasal saline.  If still stuffy, then add on flonase.   Take care.

## 2013-12-23 NOTE — Assessment & Plan Note (Signed)
No sign of sinusitis, no sign of infectious process but would restart allegra/flonse for likely allergic sx.  D/w pt.  He agrees.  F/u prn.

## 2013-12-23 NOTE — Assessment & Plan Note (Signed)
We talked about the adjustment after the surgery and he is trying to work through this.  He has healed well and is doing well from a physical standpoint, weight loss not withstanding. >25 minutes spent in face to face time with patient, >50% spent in counselling or coordination of care.  He'll notify me if his mood is worsening. Okay for outpatient f/u.

## 2014-01-07 ENCOUNTER — Telehealth: Payer: Self-pay

## 2014-01-07 ENCOUNTER — Ambulatory Visit (INDEPENDENT_AMBULATORY_CARE_PROVIDER_SITE_OTHER): Payer: Commercial Managed Care - HMO | Admitting: Cardiovascular Disease

## 2014-01-07 ENCOUNTER — Encounter: Payer: Self-pay | Admitting: Cardiovascular Disease

## 2014-01-07 ENCOUNTER — Telehealth: Payer: Self-pay | Admitting: *Deleted

## 2014-01-07 VITALS — BP 130/80 | HR 96 | Ht 73.0 in | Wt 162.8 lb

## 2014-01-07 DIAGNOSIS — C679 Malignant neoplasm of bladder, unspecified: Secondary | ICD-10-CM

## 2014-01-07 DIAGNOSIS — N183 Chronic kidney disease, stage 3 unspecified: Secondary | ICD-10-CM

## 2014-01-07 DIAGNOSIS — E7800 Pure hypercholesterolemia, unspecified: Secondary | ICD-10-CM

## 2014-01-07 DIAGNOSIS — I4891 Unspecified atrial fibrillation: Secondary | ICD-10-CM

## 2014-01-07 DIAGNOSIS — I10 Essential (primary) hypertension: Secondary | ICD-10-CM

## 2014-01-07 DIAGNOSIS — I480 Paroxysmal atrial fibrillation: Secondary | ICD-10-CM

## 2014-01-07 DIAGNOSIS — I482 Chronic atrial fibrillation, unspecified: Secondary | ICD-10-CM | POA: Insufficient documentation

## 2014-01-07 MED ORDER — NITROGLYCERIN 0.4 MG/SPRAY TL SOLN: 1.0000 | Status: DC | PRN

## 2014-01-07 MED ORDER — RIVAROXABAN 15 MG PO TABS: 15.0000 mg | ORAL_TABLET | Freq: Two times a day (BID) | ORAL | Status: DC

## 2014-01-07 MED ORDER — NITROGLYCERIN 0.4 MG SL SUBL: 0.4000 mg | SUBLINGUAL_TABLET | SUBLINGUAL | Status: DC | PRN

## 2014-01-07 MED ORDER — METOPROLOL TARTRATE 25 MG PO TABS: 25.0000 mg | ORAL_TABLET | Freq: Two times a day (BID) | ORAL | Status: DC

## 2014-01-07 MED ORDER — RIVAROXABAN 15 MG PO TABS: 15.0000 mg | ORAL_TABLET | Freq: Every day | ORAL | Status: DC

## 2014-01-07 NOTE — Telephone Encounter (Signed)
[facility] calling regarding Nitorglycerin, states spray is $295, would like rx for tablets

## 2014-01-07 NOTE — Assessment & Plan Note (Signed)
He is currently not on his cholesterol medication. We'll discuss this with him in followup. Previously was doing well on Crestor.

## 2014-01-07 NOTE — Assessment & Plan Note (Signed)
Status post resection, now with an ostomy bag

## 2014-01-07 NOTE — Assessment & Plan Note (Addendum)
Hospital records reviewed. Atrial fibrillation First noted while he was in the hospital July 2015. Now in atrial fibrillation again. We will restart metoprolol 25 mg twice a day, also start xarelto 50 mg daily. GFR was slightly below 50. We'll repeat renal function in followup, adjust anticoagulation as needed up to the 20 mg dose depending on his renal function. We did discuss various options for his atrial fibrillation. Left atrium is severely dilated on echo. He would be high risk of recurrent arrhythmia

## 2014-01-07 NOTE — Telephone Encounter (Signed)
Spoke w/ Heidi.  She states that pt is requesting nitrostat tabs due to cost.  Gave verbal order and updated pt med list.

## 2014-01-07 NOTE — Telephone Encounter (Signed)
Clarified instructions w/ Heidi.

## 2014-01-07 NOTE — Assessment & Plan Note (Addendum)
We will repeat his basic metabolic panel in followup

## 2014-01-07 NOTE — Assessment & Plan Note (Signed)
We'll start metoprolol 25 mg twice a day

## 2014-01-07 NOTE — Progress Notes (Signed)
Patient ID: [name]., male    DOB: 17-Apr-1931, 80 y.o.   MRN: [id number]  HPI Comments: [name] is a pleasant 80 year old gentleman who has a history of coronary artery disease status post coronary artery bypass grafting in 1983, peripheral vascular disease, cerebrovascular disease, hypertension, hyperlipidemia.  Last Myoview in June of 2010 revealed  mild inferior ischemia.  treated  medically.  Also note the patient had ABIs performed several years ago,  There was a greater than 50% mid left SFA stenosis and  the left ABI was in the moderate range.   Echocardiogram performed on Aug 09, 2007 showed normal LV function and trivial mitral and tricuspid regurgitation.  Abdominal ultrasound in March of 2008 showed no aneurysm. There is moderate flow reduction in the left common iliac.  Last carotid Dopplers  several years ago showed a 40-59% right and 60-79% left stenosis.  In followup today, he has had a difficult several months. Diagnosed with prostate cancer, status post partial resection early June 2015. Readmitted to the hospital with urinary retention shortly after June 8. The hospital June 14, readmitted for surgery at the end of July 2015. During his hospital course, he developed atrial fibrillation, seen by cardiology. With beta blockers, converted back to normal sinus rhythm.  In followup today, he reports that he has recovered well. He does have some mild renal insufficiency during his hospital course, creatinine up to 1.35. He denies any significant shortness of breath, no lower extremity edema, no chest pain with exertion. EKG July 28 showing atrial fibrillation. Arrival to the hospital July 23, he was normal sinus rhythm.  He stopped his cholesterol medication in the past and cholesterol climbed up to 300 . Reports that he is been taking Crestor 10 mg daily with  zetia 10 mg daily . He does not do any regular exercise but does stay active.  EKG shows atrial fibrillation with rate  96 beats per minute, nonspecific ST and T wave abnormality   Outpatient Encounter Prescriptions as of 01/07/2014  Medication Sig  . Cholecalciferol (VITAMIN D3) 1000 UNITS tablet Take 1,000 Units by mouth daily.   . fexofenadine (ALLEGRA ALLERGY) 180 MG tablet Take 1 tablet (180 mg total) by mouth daily.  . fluticasone (FLONASE) 50 MCG/ACT nasal spray Place 2 sprays into both nostrils daily.  . hydroxypropyl methylcellulose (ISOPTO TEARS) 2.5 % ophthalmic solution Place 1 drop into both eyes 3 (three) times daily as needed for dry eyes.  [name] omeprazole (PRILOSEC) 40 MG capsule Take 40 mg by mouth daily.  [name] senna-docusate (SENOKOT-S) 8.6-50 MG per tablet Take 1 tablet by mouth at bedtime as needed for mild constipation. While taking pain meds.  .  nitroGLYCERIN (NITROLINGUAL) 0.4 MG/SPRAY spray Place 1 spray under the tongue every 5 (five) minutes x 3 doses as needed for chest pain.    Review of Systems  Constitutional: Negative.   HENT: Negative.   Eyes: Negative.   Respiratory: Negative.   Cardiovascular: Negative.   Gastrointestinal: Negative.   Endocrine: Negative.   Musculoskeletal: Negative.   Skin: Negative.   Allergic/Immunologic: Negative.   Neurological: Negative.   Hematological: Negative.   Psychiatric/Behavioral: Negative.   All other systems reviewed and are negative.    BP 130/80  Pulse 96  Ht 6\' 1"  (1.854 m)  Wt 162 lb 12 oz (73.823 kg)  BMI 21.48 kg/m2  Physical Exam  Nursing note and vitals reviewed. Constitutional: He is oriented to person, place, and time. He appears well-developed  and well-nourished.  HENT:  Head: Normocephalic.  Nose: Nose normal.  Mouth/Throat: Oropharynx is clear and moist.  Eyes: Conjunctivae are normal. Pupils are equal, round, and reactive to light.  Neck: Normal range of motion. Neck supple. No JVD present. Carotid bruit is present.  Cardiovascular: Normal rate, regular rhythm, S1 normal, S2 normal and intact distal pulses.  Exam  reveals no gallop and no friction rub.   Murmur heard.  Crescendo systolic murmur is present with a grade of 2/6  Pulmonary/Chest: Effort normal and breath sounds normal. No respiratory distress. He has no wheezes. He has no rales. He exhibits no tenderness.  Abdominal: Soft. Bowel sounds are normal. He exhibits no distension. There is no tenderness.  Musculoskeletal: Normal range of motion. He exhibits no edema and no tenderness.  Lymphadenopathy:    He has no cervical adenopathy.  Neurological: He is alert and oriented to person, place, and time. Coordination normal.  Skin: Skin is warm and dry. No rash noted. No erythema.  Psychiatric: He has a normal mood and affect. His behavior is normal. Judgment and thought content normal.      Assessment and Plan

## 2014-01-07 NOTE — Telephone Encounter (Signed)
[facility] called and 2 different directions on rx Xarelto. Please call

## 2014-01-07 NOTE — Patient Instructions (Addendum)
Please start xarelto one a day (15 mg), blood thinner Please start metoprolol 25 mg twice a day for heart rate control  Please call us if you have new issues that need to be addressed before your next appt.  Your physician wants you to follow-up in: 1 month.

## 2014-02-11 ENCOUNTER — Encounter: Payer: Self-pay | Admitting: Cardiovascular Disease

## 2014-02-11 ENCOUNTER — Ambulatory Visit (INDEPENDENT_AMBULATORY_CARE_PROVIDER_SITE_OTHER): Payer: Commercial Managed Care - HMO | Admitting: Cardiovascular Disease

## 2014-02-11 ENCOUNTER — Telehealth: Payer: Self-pay

## 2014-02-11 VITALS — BP 138/92 | HR 63 | Ht 72.0 in | Wt 160.0 lb

## 2014-02-11 DIAGNOSIS — I6523 Occlusion and stenosis of bilateral carotid arteries: Secondary | ICD-10-CM

## 2014-02-11 DIAGNOSIS — E7800 Pure hypercholesterolemia, unspecified: Secondary | ICD-10-CM

## 2014-02-11 DIAGNOSIS — I480 Paroxysmal atrial fibrillation: Secondary | ICD-10-CM

## 2014-02-11 DIAGNOSIS — I10 Essential (primary) hypertension: Secondary | ICD-10-CM

## 2014-02-11 DIAGNOSIS — I25810 Atherosclerosis of coronary artery bypass graft(s) without angina pectoris: Secondary | ICD-10-CM

## 2014-02-11 NOTE — Progress Notes (Signed)
Patient ID: Kenneth Solian., male    DOB: February 27, 1932, 80 y.o.   MRN: [id number]  HPI Comments: [name] is a pleasant 80 year old gentleman who has a history of coronary artery disease status post coronary artery bypass grafting in 1983, peripheral vascular disease, cerebrovascular disease, hypertension, hyperlipidemia.  Last Myoview in June of 2010 revealed  mild inferior ischemia.  treated  medically.  Also note the patient had ABIs performed several years ago,  There was a greater than 50% mid left SFA stenosis and  the left ABI was in the moderate range. June 2015 had   He is status post bladder, prostate resection surgery for cancer. Developed atrial fibrillation in the setting July 2015  In follow-up today, he reports that he is doing well.tolerating his urine ostomy well He has not restarted his Crestor. Previous total cholesterol without a statin was 300. He feels that Zetia caused side effects, possible GI related issues though unclear. He does not do any regular exercise but stays active He denies any significant shortness of breath or chest pain with exertion  EKG today shows atrial fibrillation with rate 63 bpm, right bundle branch block  Other past medical history  Echocardiogram performed on Aug 09, 2007 showed normal LV function and trivial mitral and tricuspid regurgitation.  Abdominal ultrasound in March of 2008 showed no aneurysm. There is moderate flow reduction in the left common iliac.  Last carotid Dopplers  several years ago showed a 40-59% right and 60-79% left stenosis.  In followup today, he has had a difficult several months. Diagnosed with prostate cancer, status post partial resection early June 2015. Readmitted to the hospital with urinary retention shortly after June 8. The hospital June 14, readmitted for surgery at the end of July 2015. During his hospital course, he developed atrial fibrillation, seen by cardiology. With beta blockers, converted back to normal  sinus rhythm.    Outpatient Encounter Prescriptions as of 78/12/2013  Medication Sig  . Cholecalciferol (VITAMIN D3) 1000 UNITS tablet Take 1,000 Units by mouth daily.   . fexofenadine (ALLEGRA ALLERGY) 180 MG tablet Take 1 tablet (180 mg total) by mouth daily.  . fluticasone (FLONASE) 50 MCG/ACT nasal spray Place 2 sprays into both nostrils daily.  . hydroxypropyl methylcellulose (ISOPTO TEARS) 2.5 % ophthalmic solution Place 1 drop into both eyes 3 (three) times daily as needed for dry eyes.  . metoprolol tartrate (LOPRESSOR) 25 MG tablet Take 1 tablet (25 mg total) by mouth 2 (two) times daily.  . nitroGLYCERIN (NITROSTAT) 0.4 MG SL tablet Place 1 tablet (0.4 mg total) under the tongue every 5 (five) minutes as needed for chest pain.  [name] omeprazole (PRILOSEC) 40 MG capsule Take 40 mg by mouth daily.  . Rivaroxaban (XARELTO) 15 MG TABS tablet Take 1 tablet (15 mg total) by mouth daily with supper.  . senna-docusate (SENOKOT-S) 8.6-50 MG per tablet Take 1 tablet by mouth at bedtime as needed for mild constipation. While taking pain meds.   in terms of his social history  reports that he quit smoking about 36 years ago. His smoking use included Cigarettes. He has a 87 pack-year smoking history. He has quit using smokeless tobacco. His smokeless tobacco use included Chew. He reports that he drinks alcohol. He reports that he does not use illicit drugs.  Review of Systems  Constitutional: Negative.   Respiratory: Negative.   Cardiovascular: Negative.   Genitourinary:       Urination into an ostomy bag  Neurological:  Negative.   Psychiatric/Behavioral: Negative.   All other systems reviewed and are negative.   BP 138/92 mmHg  Pulse 63  Ht 6' (1.829 m)  Wt 160 lb (72.576 kg)  BMI 21.70 kg/m2  Physical Exam  Constitutional: He is oriented to person, place, and time. He appears well-developed and well-nourished.  HENT:  Head: Normocephalic.  Nose: Nose normal.  Mouth/Throat:  Oropharynx is clear and moist.  Eyes: Conjunctivae are normal. Pupils are equal, round, and reactive to light.  Neck: Normal range of motion. Neck supple. No JVD present.  Cardiovascular: Normal rate, regular rhythm, S1 normal, S2 normal, normal heart sounds and intact distal pulses.  Exam reveals no gallop and no friction rub.   No murmur heard. Pulmonary/Chest: Effort normal and breath sounds normal. No respiratory distress. He has no wheezes. He has no rales. He exhibits no tenderness.  Abdominal: Soft. Bowel sounds are normal. He exhibits no distension. There is no tenderness.  Genitourinary:  Ostomy in place  Musculoskeletal: Normal range of motion. He exhibits no edema or tenderness.  Lymphadenopathy:    He has no cervical adenopathy.  Neurological: He is alert and oriented to person, place, and time. Coordination normal.  Skin: Skin is warm and dry. No rash noted. No erythema.  Psychiatric: He has a normal mood and affect. His behavior is normal. Judgment and thought content normal.      Assessment and Plan   Nursing note and vitals reviewed.

## 2014-02-11 NOTE — Assessment & Plan Note (Signed)
He has been reluctant to restart his Crestor. Discussed the importance of a lower cholesterol with him. He will restart Crestor 10 mg daily.

## 2014-02-11 NOTE — Patient Instructions (Addendum)
Your next appointment will be scheduled in our new office located at :  [place]  [address], [place], [place] [address]  You are doing well.  Pleas restart crestor 10 mg daily  Please call us if you have new issues that need to be addressed before your next appt.  Your physician wants you to follow-up in: 6 months.  You will receive a reminder letter in the mail two months in advance. If you don't receive a letter, please call our office to schedule the follow-up appointment.

## 2014-02-11 NOTE — Assessment & Plan Note (Signed)
Chronic atrial fibrillation, rate controlled. Tolerating anticoagulation with low-dose/renally adjusted xarelto. We spent time talking about the indication for anticoagulation

## 2014-02-11 NOTE — Assessment & Plan Note (Signed)
40-59% disease on the right, 60-70% disease on the left We have stressed the importance of aggressive cholesterol management. Repeat ultrasound needs to be scheduled. We will recommend he have this repeated at his convenience. Order has been placed

## 2014-02-11 NOTE — Assessment & Plan Note (Signed)
Blood pressure is well controlled on today's visit. No changes made to the medications. 

## 2014-02-11 NOTE — Telephone Encounter (Signed)
l mom for pt to schedule carotid u/s

## 2014-02-11 NOTE — Assessment & Plan Note (Signed)
Currently with no symptoms of angina. No further workup at this time. Continue current medication regimen. 

## 2014-02-18 ENCOUNTER — Telehealth: Payer: Self-pay | Admitting: Cardiovascular Disease

## 2014-02-18 NOTE — Telephone Encounter (Signed)
Pt is called to tell us he recently stated Xarelto and that dr.gollan told pt that if he saw any blood in stool to call us, he is calling to tell us there was bright red blood in stool this morning.

## 2014-02-18 NOTE — Telephone Encounter (Signed)
Could probably hold the blood thinner for a day or 2, Would start a stool softener on a regular basis, daily Then restart xarelto and continue to monitor

## 2014-02-18 NOTE — Telephone Encounter (Signed)
Spoke w/ pt.  He reports bright red blood mixed w/ his stool this am.  Pt admits that he has hemorrhoids and has been more observant of any potential bleeding since starting xarelto. Pt would like for Dr. Rockey Situ to be aware, as he was advised to call if he noticed any unusual bleeding.

## 2014-02-19 NOTE — Telephone Encounter (Signed)
Spoke w/ pt.  Advised him of Dr. Donivan Scull recommendation.  He verbalizes understanding and will call back w/ any further questions or concerns.

## 2014-04-09 ENCOUNTER — Encounter (INDEPENDENT_AMBULATORY_CARE_PROVIDER_SITE_OTHER): Payer: Commercial Managed Care - HMO

## 2014-04-09 DIAGNOSIS — I6523 Occlusion and stenosis of bilateral carotid arteries: Secondary | ICD-10-CM | POA: Diagnosis not present

## 2014-04-11 DIAGNOSIS — H35372 Puckering of macula, left eye: Secondary | ICD-10-CM | POA: Diagnosis not present

## 2014-04-11 DIAGNOSIS — H524 Presbyopia: Secondary | ICD-10-CM | POA: Diagnosis not present

## 2014-04-11 DIAGNOSIS — H2513 Age-related nuclear cataract, bilateral: Secondary | ICD-10-CM | POA: Diagnosis not present

## 2014-04-22 DIAGNOSIS — L309 Dermatitis, unspecified: Secondary | ICD-10-CM | POA: Diagnosis not present

## 2014-04-22 DIAGNOSIS — L570 Actinic keratosis: Secondary | ICD-10-CM | POA: Diagnosis not present

## 2014-05-07 ENCOUNTER — Telehealth: Payer: Self-pay

## 2014-05-07 NOTE — Telephone Encounter (Signed)
Pt is having a dental cleaning on 2/18, and needs to know if he needs to have an antibiotic. Please call and advise.

## 2014-05-08 DIAGNOSIS — Z932 Ileostomy status: Secondary | ICD-10-CM | POA: Diagnosis not present

## 2014-05-08 NOTE — Telephone Encounter (Signed)
Patients at highest risk - Prophylaxis is warranted only in settings associated with the highest risk of an adverse outcome if IE occurs [1,4]. This includes patients with: ?Prosthetic heart valves, including bioprosthetic and homograft valves ?A prior history of IE ?Unrepaired cyanotic congenital heart disease, including palliative shunts and conduits ?Completely repaired congenital heart defects with prosthetic material or device during the first six months after the procedure (whether placed by surgery or by catheter intervention) ?Repaired congenital heart disease with residual defects at the site or adjacent to the site of the prosthetic patch or prosthetic device ?Valve regurgitation due to a structurally abnormal valve in a transplanted heart.   Patient does not have any of the above conditions documented. Therefore this does not require antibiotic PPX. If his DDS would like PPX they may choose to write for them.

## 2014-05-09 NOTE — Telephone Encounter (Signed)
Left detailed message on pt's vm w/ Ryan's recommendation.  Asked him to call back w/ any questions or concerns.

## 2014-05-16 ENCOUNTER — Telehealth: Payer: Self-pay

## 2014-05-16 MED ORDER — APIXABAN 5 MG PO TABS: 5.0000 mg | ORAL_TABLET | Freq: Two times a day (BID) | ORAL | Status: DC

## 2014-05-16 NOTE — Telephone Encounter (Signed)
Pt states he is having side effects from Xarelto, states he is having a rash, severe itching, and blistering. States he has been to a dermatologist last week. States he has stopped the Xarelto, and the itching has subsided, and the blisters are not as bad. States he has been off for 3 days.

## 2014-05-16 NOTE — Telephone Encounter (Signed)
Would recommend that he change to eliquis 5 mg twice a day instead of xarelto

## 2014-05-16 NOTE — Telephone Encounter (Signed)
Spoke w/ pt.  Advised her of Dr. Donivan Scull recommendation.  He is agreeable and will call back w/ any questions or concerns.

## 2014-05-16 NOTE — Telephone Encounter (Signed)
Spoke w/ pt.  He reports a reaction to Xarelto x several weeks of rash, itching and blistering .  Reports that his dermatologist has been treating w/ hydrocortisone creams w/ no relief. Reports that he stopped his xarelto 3 days ago and his sx are resolving.  He would like to know Dr. Donivan Scull recommendation.  Please advise.  Thank you.

## 2014-06-06 DIAGNOSIS — C679 Malignant neoplasm of bladder, unspecified: Secondary | ICD-10-CM | POA: Diagnosis not present

## 2014-06-06 DIAGNOSIS — C672 Malignant neoplasm of lateral wall of bladder: Secondary | ICD-10-CM | POA: Diagnosis not present

## 2014-06-08 DIAGNOSIS — L089 Local infection of the skin and subcutaneous tissue, unspecified: Secondary | ICD-10-CM | POA: Diagnosis not present

## 2014-06-09 ENCOUNTER — Encounter: Payer: Self-pay | Admitting: Physician Assistant

## 2014-06-09 ENCOUNTER — Inpatient Hospital Stay (HOSPITAL_COMMUNITY)
Admission: AD | Admit: 2014-06-09 | Discharge: 2014-06-11 | DRG: 540 | Disposition: A | Discharge status: Home / Self Care | Payer: Commercial Managed Care - HMO | Source: Ambulatory Visit | Attending: Internal Medicine | Admitting: Internal Medicine

## 2014-06-09 ENCOUNTER — Inpatient Hospital Stay (HOSPITAL_COMMUNITY): Payer: Commercial Managed Care - HMO

## 2014-06-09 DIAGNOSIS — I482 Chronic atrial fibrillation, unspecified: Secondary | ICD-10-CM | POA: Diagnosis present

## 2014-06-09 DIAGNOSIS — I129 Hypertensive chronic kidney disease with stage 1 through stage 4 chronic kidney disease, or unspecified chronic kidney disease: Secondary | ICD-10-CM | POA: Diagnosis present

## 2014-06-09 DIAGNOSIS — I080 Rheumatic disorders of both mitral and aortic valves: Secondary | ICD-10-CM | POA: Diagnosis present

## 2014-06-09 DIAGNOSIS — Z87891 Personal history of nicotine dependence: Secondary | ICD-10-CM | POA: Diagnosis not present

## 2014-06-09 DIAGNOSIS — M1009 Idiopathic gout, multiple sites: Secondary | ICD-10-CM | POA: Diagnosis not present

## 2014-06-09 DIAGNOSIS — I5032 Chronic diastolic (congestive) heart failure: Secondary | ICD-10-CM | POA: Diagnosis present

## 2014-06-09 DIAGNOSIS — E780 Pure hypercholesterolemia: Secondary | ICD-10-CM | POA: Diagnosis present

## 2014-06-09 DIAGNOSIS — Z888 Allergy status to other drugs, medicaments and biological substances status: Secondary | ICD-10-CM

## 2014-06-09 DIAGNOSIS — I350 Nonrheumatic aortic (valve) stenosis: Secondary | ICD-10-CM

## 2014-06-09 DIAGNOSIS — Z8551 Personal history of malignant neoplasm of bladder: Secondary | ICD-10-CM | POA: Diagnosis not present

## 2014-06-09 DIAGNOSIS — Z936 Other artificial openings of urinary tract status: Secondary | ICD-10-CM | POA: Diagnosis not present

## 2014-06-09 DIAGNOSIS — I2510 Atherosclerotic heart disease of native coronary artery without angina pectoris: Secondary | ICD-10-CM | POA: Diagnosis present

## 2014-06-09 DIAGNOSIS — I10 Essential (primary) hypertension: Secondary | ICD-10-CM | POA: Diagnosis not present

## 2014-06-09 DIAGNOSIS — N183 Chronic kidney disease, stage 3 unspecified: Secondary | ICD-10-CM | POA: Diagnosis present

## 2014-06-09 DIAGNOSIS — Z01818 Encounter for other preprocedural examination: Secondary | ICD-10-CM

## 2014-06-09 DIAGNOSIS — M86171 Other acute osteomyelitis, right ankle and foot: Secondary | ICD-10-CM | POA: Diagnosis not present

## 2014-06-09 DIAGNOSIS — Z79899 Other long term (current) drug therapy: Secondary | ICD-10-CM | POA: Diagnosis not present

## 2014-06-09 DIAGNOSIS — E559 Vitamin D deficiency, unspecified: Secondary | ICD-10-CM | POA: Diagnosis present

## 2014-06-09 DIAGNOSIS — M109 Gout, unspecified: Secondary | ICD-10-CM | POA: Diagnosis not present

## 2014-06-09 DIAGNOSIS — M869 Osteomyelitis, unspecified: Secondary | ICD-10-CM | POA: Insufficient documentation

## 2014-06-09 DIAGNOSIS — I739 Peripheral vascular disease, unspecified: Secondary | ICD-10-CM | POA: Diagnosis present

## 2014-06-09 DIAGNOSIS — H9190 Unspecified hearing loss, unspecified ear: Secondary | ICD-10-CM | POA: Diagnosis present

## 2014-06-09 DIAGNOSIS — D631 Anemia in chronic kidney disease: Secondary | ICD-10-CM | POA: Diagnosis present

## 2014-06-09 DIAGNOSIS — N179 Acute kidney failure, unspecified: Secondary | ICD-10-CM | POA: Diagnosis present

## 2014-06-09 DIAGNOSIS — E785 Hyperlipidemia, unspecified: Secondary | ICD-10-CM | POA: Diagnosis present

## 2014-06-09 DIAGNOSIS — Z951 Presence of aortocoronary bypass graft: Secondary | ICD-10-CM | POA: Diagnosis not present

## 2014-06-09 DIAGNOSIS — K219 Gastro-esophageal reflux disease without esophagitis: Secondary | ICD-10-CM | POA: Diagnosis present

## 2014-06-09 DIAGNOSIS — Z7901 Long term (current) use of anticoagulants: Secondary | ICD-10-CM

## 2014-06-09 DIAGNOSIS — N390 Urinary tract infection, site not specified: Secondary | ICD-10-CM | POA: Diagnosis not present

## 2014-06-09 DIAGNOSIS — E860 Dehydration: Secondary | ICD-10-CM | POA: Diagnosis not present

## 2014-06-09 DIAGNOSIS — K449 Diaphragmatic hernia without obstruction or gangrene: Secondary | ICD-10-CM | POA: Diagnosis not present

## 2014-06-09 DIAGNOSIS — I517 Cardiomegaly: Secondary | ICD-10-CM | POA: Diagnosis not present

## 2014-06-09 HISTORY — DX: Other acute osteomyelitis, right ankle and foot: M86.171

## 2014-06-09 HISTORY — DX: Other artificial openings of urinary tract status: Z93.6

## 2014-06-09 LAB — CBC WITH DIFFERENTIAL/PLATELET
BASOS ABS: 0 10*3/uL (ref 0.0–0.1)
BASOS PCT: 0 % (ref 0–1)
Eosinophils Absolute: 0.3 10*3/uL (ref 0.0–0.7)
Eosinophils Relative: 3 % (ref 0–5)
HCT: 28.7 % — ABNORMAL LOW (ref 39.0–52.0)
Hemoglobin: 8.4 g/dL — ABNORMAL LOW (ref 13.0–17.0)
LYMPHS ABS: 1.9 10*3/uL (ref 0.7–4.0)
Lymphocytes Relative: 18 % (ref 12–46)
MCH: 21.8 pg — ABNORMAL LOW (ref 26.0–34.0)
MCHC: 29.3 g/dL — ABNORMAL LOW (ref 30.0–36.0)
MCV: 74.4 fL — AB (ref 78.0–100.0)
MONO ABS: 0.9 10*3/uL (ref 0.1–1.0)
Monocytes Relative: 8 % (ref 3–12)
Neutro Abs: 7.6 10*3/uL (ref 1.7–7.7)
Neutrophils Relative %: 71 % (ref 43–77)
Platelets: 371 10*3/uL (ref 150–400)
RBC: 3.86 MIL/uL — ABNORMAL LOW (ref 4.22–5.81)
RDW: 15.8 % — AB (ref 11.5–15.5)
WBC: 10.6 10*3/uL — ABNORMAL HIGH (ref 4.0–10.5)

## 2014-06-09 LAB — COMPREHENSIVE METABOLIC PANEL
ALT: 8 U/L (ref 0–53)
AST: 32 U/L (ref 0–37)
Albumin: 3.7 g/dL (ref 3.5–5.2)
Alkaline Phosphatase: 73 U/L (ref 39–117)
Anion gap: 6 (ref 5–15)
BUN: 25 mg/dL — ABNORMAL HIGH (ref 6–23)
CO2: 28 mmol/L (ref 19–32)
Calcium: 9.1 mg/dL (ref 8.4–10.5)
Chloride: 103 mmol/L (ref 96–112)
Creatinine, Ser: 2.08 mg/dL — ABNORMAL HIGH (ref 0.50–1.35)
GFR calc Af Amer: 33 mL/min — ABNORMAL LOW (ref >90)
GFR calc non Af Amer: 28 mL/min — ABNORMAL LOW (ref >90)
GLUCOSE: 119 mg/dL — AB (ref 70–99)
POTASSIUM: 4.8 mmol/L (ref 3.5–5.1)
Sodium: 137 mmol/L (ref 135–145)
Total Bilirubin: 0.7 mg/dL (ref 0.3–1.2)
Total Protein: 7.2 g/dL (ref 6.0–8.3)

## 2014-06-09 MED ORDER — OXYCODONE HCL 5 MG PO TABS
5.0000 mg | ORAL_TABLET | ORAL | Status: DC | PRN
  Administered 2014-06-11 (×2): 10 mg via ORAL
  Filled 2014-06-09 (×2): qty 2

## 2014-06-09 MED ORDER — HEPARIN (PORCINE) IN NACL 100-0.45 UNIT/ML-% IJ SOLN
1000.0000 [IU]/h | INTRAMUSCULAR | Status: DC
  Administered 2014-06-09: 1000 [IU]/h via INTRAVENOUS
  Filled 2014-06-09 (×2): qty 250

## 2014-06-09 MED ORDER — POLYVINYL ALCOHOL 1.4 % OP SOLN
1.0000 [drp] | Freq: Three times a day (TID) | OPHTHALMIC | Status: DC | PRN
  Filled 2014-06-09: qty 15

## 2014-06-09 MED ORDER — KCL IN DEXTROSE-NACL 20-5-0.9 MEQ/L-%-% IV SOLN
INTRAVENOUS | Status: DC
  Administered 2014-06-09: 17:00:00 via INTRAVENOUS
  Filled 2014-06-09 (×4): qty 1000

## 2014-06-09 MED ORDER — VANCOMYCIN HCL IN DEXTROSE 1-5 GM/200ML-% IV SOLN
1000.0000 mg | INTRAVENOUS | Status: DC
  Administered 2014-06-09: 1000 mg via INTRAVENOUS
  Filled 2014-06-09: qty 200

## 2014-06-09 MED ORDER — ACETAMINOPHEN 325 MG PO TABS
650.0000 mg | ORAL_TABLET | Freq: Four times a day (QID) | ORAL | Status: DC | PRN
  Administered 2014-06-09: 650 mg via ORAL
  Filled 2014-06-09: qty 2

## 2014-06-09 MED ORDER — METOPROLOL TARTRATE 25 MG PO TABS
25.0000 mg | ORAL_TABLET | Freq: Two times a day (BID) | ORAL | Status: DC
  Administered 2014-06-09 – 2014-06-11 (×4): 25 mg via ORAL
  Filled 2014-06-09 (×4): qty 1

## 2014-06-09 MED ORDER — POLYETHYLENE GLYCOL 3350 17 G PO PACK: 17.0000 g | PACK | Freq: Every day | ORAL | Status: DC | PRN

## 2014-06-09 MED ORDER — MORPHINE SULFATE 2 MG/ML IJ SOLN: 1.0000 mg | INTRAMUSCULAR | Status: DC | PRN

## 2014-06-09 MED ORDER — SODIUM CHLORIDE 0.9 % IV SOLN
INTRAVENOUS | Status: DC
  Administered 2014-06-09: 15:00:00 via INTRAVENOUS
  Filled 2014-06-09: qty 1000

## 2014-06-09 MED ORDER — ALUM & MAG HYDROXIDE-SIMETH 200-200-20 MG/5ML PO SUSP: 30.0000 mL | Freq: Four times a day (QID) | ORAL | Status: DC | PRN

## 2014-06-09 MED ORDER — DOCUSATE SODIUM 100 MG PO CAPS
100.0000 mg | ORAL_CAPSULE | Freq: Two times a day (BID) | ORAL | Status: DC
  Administered 2014-06-09 – 2014-06-11 (×4): 100 mg via ORAL
  Filled 2014-06-09 (×4): qty 1

## 2014-06-09 MED ORDER — PROMETHAZINE HCL 25 MG/ML IJ SOLN: 12.5000 mg | Freq: Four times a day (QID) | INTRAMUSCULAR | Status: DC | PRN

## 2014-06-09 MED ORDER — VANCOMYCIN HCL IN DEXTROSE 750-5 MG/150ML-% IV SOLN: 750.0000 mg | INTRAVENOUS | Status: DC

## 2014-06-09 MED ORDER — ASPIRIN EC 81 MG PO TBEC
81.0000 mg | DELAYED_RELEASE_TABLET | Freq: Every day | ORAL | Status: DC
  Administered 2014-06-09 – 2014-06-11 (×3): 81 mg via ORAL
  Filled 2014-06-09 (×3): qty 1

## 2014-06-09 MED ORDER — NITROGLYCERIN 0.4 MG SL SUBL: 0.4000 mg | SUBLINGUAL_TABLET | SUBLINGUAL | Status: DC | PRN

## 2014-06-09 MED ORDER — VITAMIN D3 25 MCG (1000 UNIT) PO TABS
1000.0000 [IU] | ORAL_TABLET | Freq: Every day | ORAL | Status: DC
  Administered 2014-06-11: 1000 [IU] via ORAL
  Filled 2014-06-09 (×5): qty 1

## 2014-06-09 MED ORDER — CIPROFLOXACIN IN D5W 400 MG/200ML IV SOLN
400.0000 mg | Freq: Two times a day (BID) | INTRAVENOUS | Status: DC
  Administered 2014-06-09: 400 mg via INTRAVENOUS
  Filled 2014-06-09 (×2): qty 200

## 2014-06-09 MED ORDER — PANTOPRAZOLE SODIUM 40 MG PO TBEC
40.0000 mg | DELAYED_RELEASE_TABLET | Freq: Every day | ORAL | Status: DC
  Administered 2014-06-09 – 2014-06-11 (×3): 40 mg via ORAL
  Filled 2014-06-09 (×3): qty 1

## 2014-06-09 MED ORDER — LORATADINE 10 MG PO TABS
10.0000 mg | ORAL_TABLET | Freq: Every day | ORAL | Status: DC
  Filled 2014-06-09 (×2): qty 1

## 2014-06-09 MED ORDER — CIPROFLOXACIN IN D5W 400 MG/200ML IV SOLN: 400.0000 mg | INTRAVENOUS | Status: DC

## 2014-06-09 MED ORDER — FLUTICASONE PROPIONATE 50 MCG/ACT NA SUSP
2.0000 | Freq: Every day | NASAL | Status: DC
  Administered 2014-06-11: 2 via NASAL
  Filled 2014-06-09: qty 16
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
## 2014-06-10 ENCOUNTER — Encounter (HOSPITAL_COMMUNITY): Payer: Self-pay | Admitting: Orthopedic Surgery

## 2014-06-10 DIAGNOSIS — I739 Peripheral vascular disease, unspecified: Secondary | ICD-10-CM

## 2014-06-10 LAB — BASIC METABOLIC PANEL
Anion gap: 4 — ABNORMAL LOW (ref 5–15)
BUN: 19 mg/dL (ref 6–23)
CALCIUM: 8.9 mg/dL (ref 8.4–10.5)
CO2: 26 mmol/L (ref 19–32)
CREATININE: 1.82 mg/dL — AB (ref 0.50–1.35)
Chloride: 106 mmol/L (ref 96–112)
GFR calc Af Amer: 38 mL/min — ABNORMAL LOW (ref >90)
GFR calc non Af Amer: 33 mL/min — ABNORMAL LOW (ref >90)
GLUCOSE: 129 mg/dL — AB (ref 70–99)
Potassium: 4.9 mmol/L (ref 3.5–5.1)
Sodium: 136 mmol/L (ref 135–145)

## 2014-06-10 LAB — URIC ACID: URIC ACID, SERUM: 7.5 mg/dL (ref 4.0–7.8)

## 2014-06-10 LAB — RETICULOCYTES
RBC.: 3.75 MIL/uL — AB (ref 4.22–5.81)
Retic Count, Absolute: 60 10*3/uL (ref 19.0–186.0)
Retic Ct Pct: 1.6 % (ref 0.4–3.1)

## 2014-06-10 LAB — IRON AND TIBC
IRON: 19 ug/dL — AB (ref 42–165)
SATURATION RATIOS: 4 % — AB (ref 20–55)
TIBC: 458 ug/dL — ABNORMAL HIGH (ref 215–435)
UIBC: 439 ug/dL — AB (ref 125–400)

## 2014-06-10 LAB — SURGICAL PCR SCREEN
MRSA, PCR: NEGATIVE
Staphylococcus aureus: NEGATIVE

## 2014-06-10 LAB — CBC
HCT: 27.8 % — ABNORMAL LOW (ref 39.0–52.0)
Hemoglobin: 8 g/dL — ABNORMAL LOW (ref 13.0–17.0)
MCH: 21.4 pg — ABNORMAL LOW (ref 26.0–34.0)
MCHC: 28.8 g/dL — ABNORMAL LOW (ref 30.0–36.0)
MCV: 74.3 fL — ABNORMAL LOW (ref 78.0–100.0)
Platelets: 371 10*3/uL (ref 150–400)
RBC: 3.74 MIL/uL — ABNORMAL LOW (ref 4.22–5.81)
RDW: 15.8 % — ABNORMAL HIGH (ref 11.5–15.5)
WBC: 7.9 10*3/uL (ref 4.0–10.5)

## 2014-06-10 LAB — FERRITIN: Ferritin: 14 ng/mL — ABNORMAL LOW (ref 22–322)

## 2014-06-10 LAB — APTT: aPTT: 72 seconds — ABNORMAL HIGH (ref 24–37)

## 2014-06-10 LAB — FOLATE: Folate: >20 ng/mL

## 2014-06-10 LAB — VITAMIN B12: Vitamin B-12: 362 pg/mL (ref 211–911)

## 2014-06-10 LAB — HEPARIN LEVEL (UNFRACTIONATED): Heparin Unfractionated: 0.98 IU/mL — ABNORMAL HIGH (ref 0.30–0.70)

## 2014-06-10 MED ORDER — FERROUS SULFATE 325 (65 FE) MG PO TABS
325.0000 mg | ORAL_TABLET | Freq: Two times a day (BID) | ORAL | Status: DC
  Administered 2014-06-11: 325 mg via ORAL
  Filled 2014-06-10: qty 1

## 2014-06-10 MED ORDER — ALLOPURINOL 100 MG PO TABS
100.0000 mg | ORAL_TABLET | Freq: Two times a day (BID) | ORAL | Status: DC
  Administered 2014-06-10 – 2014-06-11 (×3): 100 mg via ORAL
  Filled 2014-06-10 (×3): qty 1

## 2014-06-10 MED ORDER — COLCHICINE 0.6 MG PO TABS
0.6000 mg | ORAL_TABLET | Freq: Two times a day (BID) | ORAL | Status: DC
  Administered 2014-06-10 – 2014-06-11 (×3): 0.6 mg via ORAL
  Filled 2014-06-10 (×3): qty 1

## 2014-06-10 MED ORDER — APIXABAN 2.5 MG PO TABS
2.5000 mg | ORAL_TABLET | Freq: Two times a day (BID) | ORAL | Status: DC
  Administered 2014-06-10: 2.5 mg via ORAL
  Filled 2014-06-10 (×3): qty 1

## 2014-06-10 MED ORDER — VITAMIN B-12 1000 MCG PO TABS
1000.0000 ug | ORAL_TABLET | Freq: Every day | ORAL | Status: DC
  Administered 2014-06-11: 1000 ug via ORAL
  Filled 2014-06-10: qty 1

## 2014-06-10 MED ORDER — ALLOPURINOL 100 MG PO TABS: 100.0000 mg | ORAL_TABLET | Freq: Two times a day (BID) | ORAL | Status: DC

## 2014-06-10 MED ORDER — COLCHICINE 0.6 MG PO TABS: 0.6000 mg | ORAL_TABLET | Freq: Two times a day (BID) | ORAL | Status: DC

## 2014-06-10 NOTE — Progress Notes (Signed)
ANTICOAGULATION CONSULT NOTE - Follow Up Consult  Pharmacy Consult for Heparin to Apixaban Indication: atrial fibrillation  Allergies  Allergen Reactions  . Lovastatin     REACTION: muscle aches  . Rosuvastatin     REACTION: muscle aches--higher dose. Reportedly tolerates 10mg  daily.  . Xarelto [Rivaroxaban]     Hives, swelling, itching  . Zetia [Ezetimibe] Diarrhea    Plus blood in urine, nausea, fatigue, dizzyness    Patient Measurements: Height: 72 inches Height: 6' (182.9 cm) Weight: 171 lb 1.2 oz (77.6 kg) IBW/kg (Calculated) : 77.6 IBW: 77.6 kg Heparin Dosing Weight: 77.6 kg  Vital Signs: Temp: 98 F (36.7 C) (03/07 1300) BP: 145/82 mmHg (03/07 1300) Pulse Rate: 72 (03/07 1300)  Labs:  Recent Labs  06/09/14 1445 06/10/14 0608 06/10/14 1040  HGB 8.4* 8.0*  --   HCT 28.7* 27.8*  --   PLT 371 371  --   APTT  --   --  72*  HEPARINUNFRC  --   --  0.98*  CREATININE 2.08*  --  1.82*    Estimated Creatinine Clearance: 34.9 mL/min (by C-G formula based on Cr of 1.82).  Assessment:   Was on Eliquis 5 mg BID prior to admission for afib; on hold for possible surgery.  Last dose 3/6 am at home.    IV heparin begun ~11:50 pm on 3/6.  Since heparin levels will be effected by recent Eliquis doses, Rx will use aPTTs for heparin monitoring. First aPTT is at goal (72 seconds) on Heparin at 1000 units/hr. No surgical intervention currently planned.  Thought to be gout rather than infection. Antibiotics stopped, colchicine and allopurinol added. Uric acid 7.5, within normal limits.  Pharmacy is consulted to transition patient from heparin to apixaban. Heparin has been stopped and apixaban will be started this evening. Pt came in on apixaban 5mg  BID, but with age of 57 and sCr of 1.8, pt is recommended to take lower dose of apixaban. Hgb 8.0, Plt 371.  Goal of Therapy:  Monitor platelets by anticoagulation protocol: Yes   Plan:  Stop heparin drip Start apixaban 2.5mg  PO  BID Monitor s/sx of bleeding Re-educate patient on apixaban  [name]. [name], PharmD Clinical Pharmacist Pager [phone] 06/10/2014,6:11 PM

## 2014-06-10 NOTE — Progress Notes (Addendum)
VASCULAR LAB PRELIMINARY  ARTERIAL  Bilateral lower extremity arterial duplex completed.  Right:  50-99% stenosis noted in the right proximal femoral artery, right mid femoral artery, and right proximal profunda artery.  Left:  Two areas of 50-99% stenosis noted in the proximal left femoral artery.  The left distal femoral artery appears occluded with collateral flow noted.  Incidental finding of a Baker's cyst in the left popliteal fossa.  Bilateral:  Severe, mixed plaque noted in the right and left femoral arteries.  Severe, mixed plaque noted in the right popliteal artery and moderate, mixed plaque in the left popliteal artery.  ABI completed:    RIGHT    LEFT    PRESSURE WAVEFORM  PRESSURE WAVEFORM  BRACHIAL 138 triphasic BRACHIAL 130 triphasic  DP   DP    AT 71 Dampened monophasic AT 83 Dampened monophasic  PT 72 Dampened monophasic PT 69 Dampened monophasic  PER   PER    GREAT TOE  NA GREAT TOE  NA    RIGHT LEFT  ABI 0.52 0.6     [name], RVT 06/10/2014, 11:55 AM

## 2014-06-10 NOTE — Progress Notes (Signed)
ANTICOAGULATION CONSULT NOTE - Follow Up Consult  Pharmacy Consult for Heparin Indication: atrial fibrillation  Allergies  Allergen Reactions  . Lovastatin     REACTION: muscle aches  . Rosuvastatin     REACTION: muscle aches--higher dose. Reportedly tolerates 10mg  daily.  . Xarelto [Rivaroxaban]     Hives, swelling, itching  . Zetia [Ezetimibe] Diarrhea    Plus blood in urine, nausea, fatigue, dizzyness    Patient Measurements: Height: 72 inches Weight: 171 lb 1.2 oz (77.6 kg) IBW: 77.6 kg Heparin Dosing Weight: 77.6 kg  Vital Signs: Temp: 97.8 F (36.6 C) (03/07 0437) Temp Source: Oral (03/07 0437) BP: 130/79 mmHg (03/07 0437) Pulse Rate: 70 (03/07 0437)  Labs:  Recent Labs  06/09/14 1445 06/10/14 0608 06/10/14 1040  HGB 8.4* 8.0*  --   HCT 28.7* 27.8*  --   PLT 371 371  --   APTT  --   --  72*  HEPARINUNFRC  --   --  0.98*  CREATININE 2.08*  --  1.82*    Estimated Creatinine Clearance: 34.9 mL/min (by C-G formula based on Cr of 1.82).  Assessment:   Was on Eliquis 5 mg BID prior to admission for afib; on hold for possible surgery.  Last dose 3/6 am at home.    IV heparin begun ~11:50 pm on 3/6.  Since heparin levels will be effected by recent Eliquis doses, Rx will use aPTTs for heparin monitoring. First aPTT is at goal (72 seconds) on Heparin at 1000 units/hr. No surgical intervention currently planned.  Thought to be gout rather than infection. Antibiotics stopped, colchicine and allopurinol added. Uric acid 7.5, within normal limits.  Goal of Therapy:  Heparin level 0.3-0.7 units/ml aPTT 66-102 seconds Monitor platelets by anticoagulation protocol: Yes   Plan:   Continue heparin drip at 1000 units/hr.  Daily aPTT, heparin level and CBC while on heparin.  Will follow up for transition back to Eliquis when it is certain that no surgery will be needed.  May need to consider decreasing Colchicine to once daily dosing within the next few days, if  creatinine remains elevated.  Arty Baumgartner, [place] Pager: [phone] 06/10/2014,12:22 PM

## 2014-06-10 NOTE — Progress Notes (Signed)
[facility] PROGRESS NOTE  [name]. JIR:[id number] DOB: 1931/07/17 DOA: 06/09/2014 PCP: [name], MD Interim summary: 81year old male with h/o CAD and s/p CABG , atrial fib on eliquis admitted for right second toe pain and swelling, h. Patient was referred to our service from orthopedics for possible OM of the toe. He ws started on IV antibiotics.  Assessment/Plan: 1. Right toe swelling : Looks like gouty flare up. No imaging on epic. Will get an x ray of the toe.  Orthopedics consulted and suggested stopping the antibiotics and discharging the patient home.  Le arterial dopplers ordered and pending. Uric acid pending. cochicine and allupurinol ordered.  Pain control and PT.   Hypertension: better controlled.    Acute kidney injury: Probably from dehydration. Repeat renal parameters show an improvement. Recommend continue the IV fludis for 24 more hours.  Check UA.   ANEMIA: Normocytic. And anemia panel revealed low iron and ferritin levels. And low normal b12 levels. Iron and b12 will be supplemented. Stool for occult blood ordered.    Chronic atrial fibrillation: Rate controlled and on eliquis.   Chronic diastolic heart failure: Appears compensated.          Code Status: fullcode Family Communication:none atbedside Disposition Plan: pending    Consultants:  orthopedics  Procedures:  none  Antibiotics:  none  HPI/Subjective: Pain better controlled.   Objective: Filed Vitals:   06/10/14 1300  BP: 145/82  Pulse: 72  Temp: 98 F (36.7 C)  Resp: 18    Intake/Output Summary (Last 24 hours) at 06/10/14 1807 Last data filed at 06/10/14 1300  Gross per 24 hour  Intake   1590 ml  Output   2000 ml  Net   -410 ml   Filed Weights   06/10/14 0900  Weight: 77.6 kg (171 lb 1.2 oz)    Exam:   General:  Alert afebrile comfortable , not in any distress  Cardiovascular: s1s2, irregular, murmer present.   Respiratory: clear to  auscultation, no wheezing or rhonchi  Abdomen: soft non tender non distended bowel sounds heard  Musculoskeletal: right second toe is erythematous , tender and swollen   Data Reviewed: Basic Metabolic Panel:  Recent Labs Lab 06/09/14 1445 06/10/14 1040  NA 137 136  K 4.8 4.9  CL 103 106  CO2 28 26  GLUCOSE 119* 129*  BUN 25* 19  CREATININE 2.08* 1.82*  CALCIUM 9.1 8.9   Liver Function Tests:  Recent Labs Lab 06/09/14 1445  AST 32  ALT 8  ALKPHOS 73  BILITOT 0.7  PROT 7.2  ALBUMIN 3.7   No results for input(s): LIPASE, AMYLASE in the last 168 hours. No results for input(s): AMMONIA in the last 168 hours. CBC:  Recent Labs Lab 06/09/14 1445 06/10/14 0608  WBC 10.6* 7.9  NEUTROABS 7.6  --   HGB 8.4* 8.0*  HCT 28.7* 27.8*  MCV 74.4* 74.3*  PLT 371 371   Cardiac Enzymes: No results for input(s): CKTOTAL, CKMB, CKMBINDEX, TROPONINI in the last 168 hours. BNP (last 3 results) No results for input(s): BNP in the last 8760 hours.  ProBNP (last 3 results) No results for input(s): PROBNP in the last 8760 hours.  CBG: No results for input(s): GLUCAP in the last 168 hours.  Recent Results (from the past 240 hour(s))  Surgical pcr screen     Status: None   Collection Time: 06/09/14 11:02 PM  Result Value Ref Range Status   MRSA, PCR NEGATIVE NEGATIVE Final  Staphylococcus aureus NEGATIVE NEGATIVE Final    Comment:        The Xpert SA Assay (FDA approved for NASAL specimens in patients over 79 years of age), is one component of a comprehensive surveillance program.  Test performance has been validated by [facility] for patients greater than or equal to 79 year old. It is not intended to diagnose infection nor to guide or monitor treatment.      Studies: Dg Chest 2 View  06/09/2014   CLINICAL DATA:  Preop examination for orthopedic surgery toe injury  EXAM: CHEST  2 VIEW  COMPARISON:  06/06/2014  FINDINGS: Moderate hiatal hernia. Mild cardiac  enlargement. Vascular pattern normal. Calcified aortic arch. Lungs clear. No effusion.  IMPRESSION: Moderate hiatal hernia with mild cardiac enlargement   Electronically Signed   By: Skipper Cliche M.D.   On: 06/09/2014 17:39    Scheduled Meds: . allopurinol  100 mg Oral BID  . aspirin EC  81 mg Oral Daily  . cholecalciferol  1,000 Units Oral Daily  . colchicine  0.6 mg Oral BID  . docusate sodium  100 mg Oral BID  . fluticasone  2 spray Each Nare Daily  . loratadine  10 mg Oral Daily  . metoprolol tartrate  25 mg Oral BID  . pantoprazole  40 mg Oral Daily   Continuous Infusions: . dextrose 5 % and 0.9 % NaCl with KCl 20 mEq/L 75 mL/hr at 06/10/14 0600  . sodium chloride 0.9 % 1,000 mL infusion Stopped (06/09/14 1719)    Principal Problem:   Acute osteomyelitis of toe of right foot Active Problems:   Vitamin D deficiency   Dyslipidemia   HYPERTENSION, BENIGN ESSENTIAL   GERD (gastroesophageal reflux disease) with stricture   CAD in native artery = CABG in 1983; mild inferior ischemia on 2010 Nuc ST.   CKD (chronic kidney disease), stage III   Mild aortic stenosis by prior echocardiogram   Chronic atrial fibrillation   Presence of urostomy   PVD (peripheral vascular disease)   Chronic diastolic heart failure, NYHA class 2    Time spent: 25  minutes    [facility] Pager [phone]  If 7PM-7AM, please contact night-coverage at [email or web address], password [facility] 06/10/2014, 6:07 PM  LOS: 1 day
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
## 2014-06-10 NOTE — Progress Notes (Signed)
     Subjective:  Patient reports pain as mild.  Patient reports blunt trauma to the second toe of the right foot that then became swollen and red.  Seen in UC on 06/08/14 and started on antibiotics.  No improvement 06/09/14, so admitted for possible wash out.  Patient was made NPO.  Dr. Sharol Given saw this morning and believe it is gout instead of infection.  Started on allopurinol and colchicine.  Abx d/c.  Objective:   VITALS:   Filed Vitals:   06/09/14 1337 06/09/14 2029 06/10/14 0437  BP: 133/70 133/67 130/79  Pulse: 86 74 70  Temp: 98.5 F (36.9 C) 98.8 F (37.1 C) 97.8 F (36.6 C)  TempSrc: Oral Oral Oral  Resp: 18 18 18   SpO2: 98% 99% 98%    Neurologically intact ABD soft Neurovascular intact Sensation intact distally  Diminished pulses to his right foot  Redness and swelling of the second toe right foot there are no open ulcers. The toe is extremely painful to light touch redness does not extend proximal to the MTP joint  Lab Results  Component Value Date   WBC 10.6* 06/09/2014   HGB 8.4* 06/09/2014   HCT 28.7* 06/09/2014   MCV 74.4* 06/09/2014   PLT 371 06/09/2014   BMET    Component Value Date/Time   NA 137 06/09/2014 1445   K 4.8 06/09/2014 1445   CL 103 06/09/2014 1445   CO2 28 06/09/2014 1445   GLUCOSE 119* 06/09/2014 1445   BUN 25* 06/09/2014 1445   CREATININE 2.08* 06/09/2014 1445   CALCIUM 9.1 06/09/2014 1445   GFRNONAA 28* 06/09/2014 1445   GFRAA 33* 06/09/2014 1445     Assessment/Plan:     Principal Problem:   Acute osteomyelitis of toe of right foot Active Problems:   Vitamin D deficiency   Dyslipidemia   HYPERTENSION, BENIGN ESSENTIAL   GERD (gastroesophageal reflux disease) with stricture   CAD in native artery = CABG in 1983; mild inferior ischemia on 2010 Nuc ST.   CKD (chronic kidney disease), stage III   Mild aortic stenosis by prior echocardiogram   Chronic atrial fibrillation   Presence of urostomy   PVD (peripheral vascular  disease)   Chronic diastolic heart failure, NYHA class 2   Up with therapy WBAT in the right LE No indications for surgery at this time. Started on allopuirinol and colchicine per Dr. Sharol Given this morning for gout.  D/c abx.   LE arterial duplex ordered. Diet ordered. Uric acid pending.   Anelle Parlow Lelan Pons 06/10/2014, 8:17 AM [phone]

## 2014-06-11 DIAGNOSIS — I739 Peripheral vascular disease, unspecified: Secondary | ICD-10-CM

## 2014-06-11 DIAGNOSIS — Z936 Other artificial openings of urinary tract status: Secondary | ICD-10-CM

## 2014-06-11 LAB — URINALYSIS, ROUTINE W REFLEX MICROSCOPIC
BILIRUBIN URINE: NEGATIVE
GLUCOSE, UA: NEGATIVE mg/dL
KETONES UR: NEGATIVE mg/dL
Nitrite: NEGATIVE
PROTEIN: NEGATIVE mg/dL
Specific Gravity, Urine: 1.012 (ref 1.005–1.030)
Urobilinogen, UA: 0.2 mg/dL (ref 0.0–1.0)
pH: 6.5 (ref 5.0–8.0)

## 2014-06-11 LAB — CBC
HCT: 28 % — ABNORMAL LOW (ref 39.0–52.0)
Hemoglobin: 8.3 g/dL — ABNORMAL LOW (ref 13.0–17.0)
MCH: 22 pg — AB (ref 26.0–34.0)
MCHC: 29.6 g/dL — ABNORMAL LOW (ref 30.0–36.0)
MCV: 74.3 fL — ABNORMAL LOW (ref 78.0–100.0)
Platelets: 380 10*3/uL (ref 150–400)
RBC: 3.77 MIL/uL — AB (ref 4.22–5.81)
RDW: 15.7 % — ABNORMAL HIGH (ref 11.5–15.5)
WBC: 8 10*3/uL (ref 4.0–10.5)

## 2014-06-11 LAB — URINE MICROSCOPIC-ADD ON

## 2014-06-11 LAB — APTT: APTT: 35 s (ref 24–37)

## 2014-06-11 LAB — HEPARIN LEVEL (UNFRACTIONATED): Heparin Unfractionated: 1.3 IU/mL — ABNORMAL HIGH (ref 0.30–0.70)

## 2014-06-11 MED ORDER — CYANOCOBALAMIN 1000 MCG PO TABS: 1000.0000 ug | ORAL_TABLET | Freq: Every day | ORAL | Status: DC

## 2014-06-11 MED ORDER — POLYETHYLENE GLYCOL 3350 17 G PO PACK: 17.0000 g | PACK | Freq: Every day | ORAL | Status: AC | PRN

## 2014-06-11 MED ORDER — FERROUS SULFATE 325 (65 FE) MG PO TABS: 325.0000 mg | ORAL_TABLET | Freq: Two times a day (BID) | ORAL | Status: DC

## 2014-06-11 NOTE — Discharge Summary (Signed)
Physician Discharge Summary  [name]. QBH:[id number] DOB: 1931-08-10 DOA: 06/09/2014  PCP: [name], MD  Admit date: 06/09/2014 Discharge date: 06/11/2014  Time spent: 30 minutes  Recommendations for Outpatient Follow-up:  1. Follow up with Dr Sharol Given as recommended.  2. Follow up renal function with PCP.   Discharge Diagnoses:  Principal Problem:   Acute osteomyelitis of toe of right foot Active Problems:   Vitamin D deficiency   Dyslipidemia   HYPERTENSION, BENIGN ESSENTIAL   GERD (gastroesophageal reflux disease) with stricture   CAD in native artery = CABG in 1983; mild inferior ischemia on 2010 Nuc ST.   CKD (chronic kidney disease), stage III   Mild aortic stenosis by prior echocardiogram   Chronic atrial fibrillation   Presence of urostomy   PVD (peripheral vascular disease)   Chronic diastolic heart failure, NYHA class 2   Discharge Condition: improved  Diet recommendation: low sodium diet  Filed Weights   06/10/14 0900  Weight: 77.6 kg (171 lb 1.2 oz)    History of present illness:   81year old male with h/o CAD and s/p CABG , atrial fib on eliquis admitted for right second toe pain and swelling, h. Patient was referred to our service from orthopedics for possible OM of the toe vs gout flare up.   Hospital Course:  1. Right toe swelling : Looks like gouty flare up. No imaging on epic.  Orthopedics consulted and suggested stopping the antibiotics and discharging the patient home.  Le arterial dopplers ordered and outpatient follow up with orthopedics. cochicine and allupurinol ordered.  Pain control and PT.   Hypertension: better controlled.    Acute kidney injury: Probably from dehydration. Repeat renal parameters show an improvement.follow up renal function in 1 to 2days.[name]   ANEMIA: Normocytic. And anemia panel revealed low iron and ferritin levels. And low normal b12 levels. Iron and b12 will be supplemented.    Chronic atrial  fibrillation: Rate controlled and on eliquis.   Chronic diastolic heart failure: Appears compensated.   Procedures:  none  Consultations:  Orthopedics.   Discharge Exam: Filed Vitals:   06/11/14 0633  BP: 158/85  Pulse:   Temp:   Resp:     General: alert afebrile comfortable Cardiovascular: s1s2 Respiratory: ctab  Discharge Instructions   Discharge Instructions    Discharge instructions    Complete by:  As directed   Follow up with Dr Sharol Given as recommended.          Current Discharge Medication List    START taking these medications   Details  allopurinol (ZYLOPRIM) 100 MG tablet Take 1 tablet (100 mg total) by mouth 2 (two) times daily. Qty: 60 tablet, Refills: 3    colchicine 0.6 MG tablet Take 1 tablet (0.6 mg total) by mouth 2 (two) times daily. Qty: 60 tablet, Refills: 3    ferrous sulfate 325 (65 FE) MG tablet Take 1 tablet (325 mg total) by mouth 2 (two) times daily with a meal. Qty: 30 tablet, Refills: 3    polyethylene glycol (MIRALAX / GLYCOLAX) packet Take 17 g by mouth daily as needed for mild constipation. Qty: 14 each, Refills: 0    vitamin B-12 1000 MCG tablet Take 1 tablet (1,000 mcg total) by mouth daily. Qty: 30 tablet, Refills: 0      CONTINUE these medications which have NOT CHANGED   Details  apixaban (ELIQUIS) 5 MG TABS tablet Take 1 tablet (5 mg total) by mouth 2 (two) times daily.  Qty: 60 tablet, Refills: 6    Cholecalciferol (VITAMIN D3) 1000 UNITS tablet Take 1,000 Units by mouth daily.     fexofenadine (ALLEGRA ALLERGY) 180 MG tablet Take 1 tablet (180 mg total) by mouth daily.    fluticasone (FLONASE) 50 MCG/ACT nasal spray Place 2 sprays into both nostrils daily.    hydroxypropyl methylcellulose (ISOPTO TEARS) 2.5 % ophthalmic solution Place 1 drop into both eyes 3 (three) times daily as needed for dry eyes.    metoprolol tartrate (LOPRESSOR) 25 MG tablet Take 1 tablet (25 mg total) by mouth 2 (two) times daily. Qty: 60  tablet, Refills: 11    nitroGLYCERIN (NITROSTAT) 0.4 MG SL tablet Place 1 tablet (0.4 mg total) under the tongue every 5 (five) minutes as needed for chest pain. Qty: 25 tablet, Refills: 6    omeprazole (PRILOSEC) 40 MG capsule Take 40 mg by mouth daily.    senna-docusate (SENOKOT-S) 8.6-50 MG per tablet Take 1 tablet by mouth at bedtime as needed for mild constipation. While taking pain meds. Qty: 30 tablet, Refills: 0    triamcinolone cream (KENALOG) 0.1 % Apply 1 application topically daily as needed (itching).  Refills: 1       Allergies  Allergen Reactions  . Lovastatin     REACTION: muscle aches  . Rosuvastatin     REACTION: muscle aches--higher dose. Reportedly tolerates 10mg  daily.  . Xarelto [Rivaroxaban]     Hives, swelling, itching  . Zetia [Ezetimibe] Diarrhea    Plus blood in urine, nausea, fatigue, dizzyness   Follow-up Information    Follow up with DUDA,MARCUS V, MD In 3 weeks.   Specialty:  Orthopedic Surgery   Contact information:   [place] [address] [phone]        The results of significant diagnostics from this hospitalization (including imaging, microbiology, ancillary and laboratory) are listed below for reference.    Significant Diagnostic Studies: Dg Chest 2 View  06/09/2014   CLINICAL DATA:  Preop examination for orthopedic surgery toe injury  EXAM: CHEST  2 VIEW  COMPARISON:  06/06/2014  FINDINGS: Moderate hiatal hernia. Mild cardiac enlargement. Vascular pattern normal. Calcified aortic arch. Lungs clear. No effusion.  IMPRESSION: Moderate hiatal hernia with mild cardiac enlargement   Electronically Signed   By: Skipper Cliche M.D.   On: 06/09/2014 17:39    Microbiology: Recent Results (from the past 240 hour(s))  Surgical pcr screen     Status: None   Collection Time: 06/09/14 11:02 PM  Result Value Ref Range Status   MRSA, PCR NEGATIVE NEGATIVE Final   Staphylococcus aureus NEGATIVE NEGATIVE Final    Comment:         The Xpert SA Assay (FDA approved for NASAL specimens in patients over 85 years of age), is one component of a comprehensive surveillance program.  Test performance has been validated by [facility] for patients greater than or equal to 38 year old. It is not intended to diagnose infection nor to guide or monitor treatment.      Labs: Basic Metabolic Panel:  Recent Labs Lab 06/09/14 1445 06/10/14 1040  NA 137 136  K 4.8 4.9  CL 103 106  CO2 28 26  GLUCOSE 119* 129*  BUN 25* 19  CREATININE 2.08* 1.82*  CALCIUM 9.1 8.9   Liver Function Tests:  Recent Labs Lab 06/09/14 1445  AST 32  ALT 8  ALKPHOS 73  BILITOT 0.7  PROT 7.2  ALBUMIN 3.7   No  results for input(s): LIPASE, AMYLASE in the last 168 hours. No results for input(s): AMMONIA in the last 168 hours. CBC:  Recent Labs Lab 06/09/14 1445 06/10/14 0608 06/11/14 0547  WBC 10.6* 7.9 8.0  NEUTROABS 7.6  --   --   HGB 8.4* 8.0* 8.3*  HCT 28.7* 27.8* 28.0*  MCV 74.4* 74.3* 74.3*  PLT 371 371 380   Cardiac Enzymes: No results for input(s): CKTOTAL, CKMB, CKMBINDEX, TROPONINI in the last 168 hours. BNP: BNP (last 3 results) No results for input(s): BNP in the last 8760 hours.  ProBNP (last 3 results) No results for input(s): PROBNP in the last 8760 hours.  CBG: No results for input(s): GLUCAP in the last 168 hours.     SignedHosie Poisson  [facility] 06/11/2014, 12:04 PM

## 2014-06-11 NOTE — Progress Notes (Signed)
IV D/Cd, scripts and D/C instructions reviewed with Pt. Pt agreed with his D/C plan and felt comfortable with his home instructions. pts son is a the bedside to take Pt home at this time.

## 2014-06-11 NOTE — Discharge Instructions (Signed)
Information on my medicine - ELIQUIS (apixaban)  This medication education was reviewed with me or my healthcare representative as part of my discharge preparation.  The pharmacist that spoke with me during my hospital stay was:  Pat Patrick, [facility]  Why was Eliquis prescribed for you? Eliquis was prescribed for you to reduce the risk of forming blood clots that can cause a stroke if you have a medical condition called atrial fibrillation (a type of irregular heartbeat) OR to reduce the risk of a blood clots forming after orthopedic surgery.  What do You need to know about Eliquis ? Take your Eliquis TWICE DAILY - one tablet in the morning and one tablet in the evening with or without food.  It would be best to take the doses about the same time each day.  If you have difficulty swallowing the tablet whole please discuss with your pharmacist how to take the medication safely.  Take Eliquis exactly as prescribed by your doctor and DO NOT stop taking Eliquis without talking to the doctor who prescribed the medication.  Stopping may increase your risk of developing a new clot or stroke.  Refill your prescription before you run out.  After discharge, you should have regular check-up appointments with your healthcare provider that is prescribing your Eliquis.  In the future your dose may need to be changed if your kidney function or weight changes by a significant amount or as you get older.  What do you do if you miss a dose? If you miss a dose, take it as soon as you remember on the same day and resume taking twice daily.  Do not take more than one dose of ELIQUIS at the same time.  Important Safety Information A possible side effect of Eliquis is bleeding. You should call your healthcare provider right away if you experience any of the following: ? Bleeding from an injury or your nose that does not stop. ? Unusual colored urine (red or dark brown) or unusual colored stools (red or  black). ? Unusual bruising for unknown reasons. ? A serious fall or if you hit your head (even if there is no bleeding).  Some medicines may interact with Eliquis and might increase your risk of bleeding or clotting while on Eliquis. To help avoid this, consult your healthcare provider or pharmacist prior to using any new prescription or non-prescription medications, including herbals, vitamins, non-steroidal anti-inflammatory drugs (NSAIDs) and supplements.  This website has more information on Eliquis (apixaban): [email or web address].no.

## 2014-06-13 DIAGNOSIS — C672 Malignant neoplasm of lateral wall of bladder: Secondary | ICD-10-CM | POA: Diagnosis not present

## 2014-06-13 DIAGNOSIS — N1330 Unspecified hydronephrosis: Secondary | ICD-10-CM | POA: Diagnosis not present

## 2014-06-13 DIAGNOSIS — N189 Chronic kidney disease, unspecified: Secondary | ICD-10-CM | POA: Diagnosis not present

## 2014-07-04 DIAGNOSIS — M1A09X1 Idiopathic chronic gout, multiple sites, with tophus (tophi): Secondary | ICD-10-CM | POA: Diagnosis not present

## 2014-07-04 DIAGNOSIS — M79671 Pain in right foot: Secondary | ICD-10-CM | POA: Diagnosis not present

## 2014-07-04 DIAGNOSIS — M25571 Pain in right ankle and joints of right foot: Secondary | ICD-10-CM | POA: Diagnosis not present

## 2014-07-09 DIAGNOSIS — M109 Gout, unspecified: Secondary | ICD-10-CM | POA: Diagnosis not present

## 2014-07-11 DIAGNOSIS — M79671 Pain in right foot: Secondary | ICD-10-CM | POA: Diagnosis not present

## 2014-07-11 DIAGNOSIS — M19071 Primary osteoarthritis, right ankle and foot: Secondary | ICD-10-CM | POA: Diagnosis not present

## 2014-08-05 DIAGNOSIS — Z932 Ileostomy status: Secondary | ICD-10-CM | POA: Diagnosis not present

## 2014-08-08 DIAGNOSIS — M19071 Primary osteoarthritis, right ankle and foot: Secondary | ICD-10-CM | POA: Diagnosis not present

## 2014-08-08 DIAGNOSIS — M1A09X1 Idiopathic chronic gout, multiple sites, with tophus (tophi): Secondary | ICD-10-CM | POA: Diagnosis not present

## 2014-08-08 DIAGNOSIS — M79671 Pain in right foot: Secondary | ICD-10-CM | POA: Diagnosis not present

## 2014-08-08 DIAGNOSIS — M25571 Pain in right ankle and joints of right foot: Secondary | ICD-10-CM | POA: Diagnosis not present

## 2014-08-08 DIAGNOSIS — M1009 Idiopathic gout, multiple sites: Secondary | ICD-10-CM | POA: Diagnosis not present

## 2014-08-09 ENCOUNTER — Encounter: Payer: Self-pay | Admitting: Family Medicine

## 2014-08-09 ENCOUNTER — Ambulatory Visit (INDEPENDENT_AMBULATORY_CARE_PROVIDER_SITE_OTHER): Payer: Commercial Managed Care - HMO | Admitting: Family Medicine

## 2014-08-09 VITALS — BP 120/60 | HR 81 | Temp 97.4°F | Wt 169.0 lb

## 2014-08-09 DIAGNOSIS — R21 Rash and other nonspecific skin eruption: Secondary | ICD-10-CM | POA: Diagnosis not present

## 2014-08-09 MED ORDER — FLUOCINONIDE 0.05 % EX CREA: 1.0000 "application " | TOPICAL_CREAM | Freq: Two times a day (BID) | CUTANEOUS | Status: DC | PRN

## 2014-08-09 NOTE — Patient Instructions (Signed)
Use the cream as needed on the itch areas and see if that helps.   It still isn't clear to me why you are having troubles.  I want to hear how much the cream helps.   Take care.  Glad to see you.

## 2014-08-09 NOTE — Progress Notes (Signed)
Pre visit review using our clinic review tool, if applicable. No additional management support is needed unless otherwise documented below in the visit note.  Prev with rash over the last few months.  The rash predates recently added meds (allopurinol).  Changing off xarelto didn't help the rash.  Rash itches.  On the trunk and scalp and legs prev, not on the hands.  The lesions can occ blister up and get irritated.  He'll get a region that will pop up, itch, then gradually resolve.  He saw Dr. Allyson Sabal and was put on TAC w/o relief.  Gold bond cream helps some.    Meds, vitals, and allergies reviewed.   ROS: See HPI.  Otherwise, noncontributory.  nad ncat rrr ctab Skin with scattered nondermatomal distribution of skin lesions.  Small irritated lesions will arise with minimal surrounding erythema.  No fluctuant mass.  No intact vesicles.

## 2014-08-11 DIAGNOSIS — R21 Rash and other nonspecific skin eruption: Secondary | ICD-10-CM | POA: Insufficient documentation

## 2014-08-11 NOTE — Assessment & Plan Note (Signed)
He didn't improve with local TAC.  Will try lidex to see if this truly is steroid responsive.  That may help him and provide some insight into the process, since the cause isn't clear.  This doesn't appear infectious. Still okay for outpatient f/u.  He agrees.

## 2014-08-13 DIAGNOSIS — N1330 Unspecified hydronephrosis: Secondary | ICD-10-CM | POA: Diagnosis not present

## 2014-08-13 DIAGNOSIS — C672 Malignant neoplasm of lateral wall of bladder: Secondary | ICD-10-CM | POA: Diagnosis not present

## 2014-08-13 DIAGNOSIS — Z932 Ileostomy status: Secondary | ICD-10-CM | POA: Diagnosis not present

## 2014-08-13 DIAGNOSIS — N183 Chronic kidney disease, stage 3 (moderate): Secondary | ICD-10-CM | POA: Diagnosis not present

## 2014-08-23 ENCOUNTER — Telehealth: Payer: Self-pay | Admitting: Family Medicine

## 2014-08-23 NOTE — Telephone Encounter (Signed)
Noted glad he is some better but I hate to hear that it isn't fully resolved.  He had seen Dr. Marlon Pel.  Would patient consider going back to see him?  I don't know if Dr. Allyson Sabal would consider a skin biopsy.  Let me know what patient prefers and I'll work on it.  Thanks.

## 2014-08-23 NOTE — Telephone Encounter (Signed)
Patient was given Fluocinonide cream at his last office visit and was told to call back and let Dr.Duncan know how it did.  Patient said Fluocinonide cream is helping with the itching. Patient is still getting the blisters, but the cream does help.

## 2014-08-26 ENCOUNTER — Telehealth: Payer: Self-pay | Admitting: Family Medicine

## 2014-08-26 DIAGNOSIS — R21 Rash and other nonspecific skin eruption: Secondary | ICD-10-CM

## 2014-08-26 NOTE — Telephone Encounter (Signed)
Patient's wife notified as instructed by telephone and verbalized understanding. Patient's wife stated that he has seen Dr. Allyson Sabal in the past and was not happy with him. Patient's wife stated that he would like a referral to a different dermatologist.  Patient's wife stated that he is still having some itching.

## 2014-08-26 NOTE — Telephone Encounter (Signed)
Patient called and said he received a call about being referred to a dermatologist.  Patient said he'd like to see [facility].  Phone [phone].

## 2014-08-27 NOTE — Addendum Note (Signed)
Addended by: Tonia Ghent on: 08/27/2014 05:46 AM   Modules accepted: Orders

## 2014-08-27 NOTE — Telephone Encounter (Signed)
Patient notified by telephone that the referral coordinator will be in touch with him to get this set up.

## 2014-08-27 NOTE — Telephone Encounter (Signed)
See following phone noted.  Referral done.

## 2014-08-27 NOTE — Telephone Encounter (Signed)
Ordered. Thanks

## 2014-09-23 ENCOUNTER — Other Ambulatory Visit: Payer: Self-pay | Admitting: Internal Medicine

## 2014-09-25 ENCOUNTER — Encounter: Payer: Self-pay | Admitting: Physician Assistant

## 2014-09-25 ENCOUNTER — Other Ambulatory Visit: Payer: Self-pay | Admitting: Surgery

## 2014-09-25 DIAGNOSIS — K4020 Bilateral inguinal hernia, without obstruction or gangrene, not specified as recurrent: Secondary | ICD-10-CM | POA: Diagnosis not present

## 2014-09-25 NOTE — Progress Notes (Signed)
Letter is typed, just needs to be printed, signed, and faxed.

## 2014-09-25 NOTE — Patient Instructions (Signed)
Received cardiac clearance request for pt to proceed w/ b/l open inguinal hernia w/ mesh under general anesthesia, not scheduled yet.  Ryan's letter faxed to [facility], attn Erasmo Leventhal, RN, BSN at [phone].

## 2014-09-27 ENCOUNTER — Other Ambulatory Visit: Payer: Self-pay | Admitting: Family Medicine

## 2014-09-27 ENCOUNTER — Other Ambulatory Visit: Payer: Self-pay | Admitting: Physician Assistant

## 2014-09-27 ENCOUNTER — Telehealth: Payer: Self-pay | Admitting: *Deleted

## 2014-09-27 DIAGNOSIS — C44319 Basal cell carcinoma of skin of other parts of face: Secondary | ICD-10-CM | POA: Diagnosis not present

## 2014-09-27 DIAGNOSIS — C44310 Basal cell carcinoma of skin of unspecified parts of face: Secondary | ICD-10-CM | POA: Diagnosis not present

## 2014-09-27 DIAGNOSIS — C44519 Basal cell carcinoma of skin of other part of trunk: Secondary | ICD-10-CM | POA: Diagnosis not present

## 2014-09-27 DIAGNOSIS — C4491 Basal cell carcinoma of skin, unspecified: Secondary | ICD-10-CM

## 2014-09-27 DIAGNOSIS — L299 Pruritus, unspecified: Secondary | ICD-10-CM | POA: Diagnosis not present

## 2014-09-27 HISTORY — DX: Basal cell carcinoma of skin, unspecified: C44.91

## 2014-09-27 NOTE — Telephone Encounter (Signed)
Patient now on Eliquis secondary allergic reaction. Discontinue Eliquis 2 days prior and restart 24 hours post surgery or as soon possible per MD. Bridging is generally not indicated with NOACs.

## 2014-09-27 NOTE — Telephone Encounter (Signed)
Crystal from [facility] calling, stating they received the clearance from [place], but we did not mention if patient needs to be off their Christen Butter Should they bridge it or take him off it. Please advise.

## 2014-10-10 DIAGNOSIS — M1009 Idiopathic gout, multiple sites: Secondary | ICD-10-CM | POA: Diagnosis not present

## 2014-10-10 DIAGNOSIS — M79671 Pain in right foot: Secondary | ICD-10-CM | POA: Diagnosis not present

## 2014-10-10 DIAGNOSIS — M19071 Primary osteoarthritis, right ankle and foot: Secondary | ICD-10-CM | POA: Diagnosis not present

## 2014-10-10 DIAGNOSIS — M1A09X1 Idiopathic chronic gout, multiple sites, with tophus (tophi): Secondary | ICD-10-CM | POA: Diagnosis not present

## 2014-10-16 ENCOUNTER — Other Ambulatory Visit (HOSPITAL_COMMUNITY): Payer: Self-pay | Admitting: *Deleted

## 2014-10-16 NOTE — Pre-Procedure Instructions (Addendum)
    [name].  10/16/2014      Your procedure is scheduled on Thursday, October 24, 2014 at 7:30 AM.   Report to [facility] Entrance "A" Admitting Office at 5:30 AM.   Call this number if you have problems the morning of surgery: [phone]   Any questions prior to day of surgery, please call [phone] between 8 & 4 PM.    Remember:  Do not eat food or drink liquids after midnight Wednesday, 10/23/14.    Take these medicines the morning of surgery with A SIP OF WATER:allopurinol,flonase if needed,metoprolol(Lopressor),omeprazole(Prilosec)   Stop Eliquis 2 days prior to surgery as instructed by your cardiologist.   Do not wear jewelry.  Do not wear lotions, powders, or cologne.    Men may shave face and neck.  Do not bring valuables to the hospital.  [facility] is not responsible for any belongings or valuables.  Contacts, dentures or bridgework may not be worn into surgery.  Leave your suitcase in the car.  After surgery it may be brought to your room.  For patients admitted to the hospital, discharge time will be determined by your treatment team.  Special instructions: See "Preparing for Surgery" Instruction sheet.   Please read over the following fact sheets that you were given. Pain Booklet, Coughing and Deep Breathing and Surgical Site Infection Prevention

## 2014-10-17 ENCOUNTER — Encounter (HOSPITAL_COMMUNITY)
Admission: RE | Admit: 2014-10-17 | Discharge: 2014-10-17 | Disposition: A | Discharge status: Home / Self Care | Payer: Commercial Managed Care - HMO | Source: Ambulatory Visit | Attending: Surgery | Admitting: Surgery

## 2014-10-17 ENCOUNTER — Encounter (HOSPITAL_COMMUNITY): Payer: Self-pay

## 2014-10-17 DIAGNOSIS — Z87891 Personal history of nicotine dependence: Secondary | ICD-10-CM | POA: Diagnosis not present

## 2014-10-17 DIAGNOSIS — I2510 Atherosclerotic heart disease of native coronary artery without angina pectoris: Secondary | ICD-10-CM | POA: Insufficient documentation

## 2014-10-17 DIAGNOSIS — I4891 Unspecified atrial fibrillation: Secondary | ICD-10-CM | POA: Diagnosis not present

## 2014-10-17 DIAGNOSIS — E785 Hyperlipidemia, unspecified: Secondary | ICD-10-CM | POA: Insufficient documentation

## 2014-10-17 DIAGNOSIS — Z7902 Long term (current) use of antithrombotics/antiplatelets: Secondary | ICD-10-CM | POA: Diagnosis not present

## 2014-10-17 DIAGNOSIS — K219 Gastro-esophageal reflux disease without esophagitis: Secondary | ICD-10-CM | POA: Diagnosis not present

## 2014-10-17 DIAGNOSIS — K4090 Unilateral inguinal hernia, without obstruction or gangrene, not specified as recurrent: Secondary | ICD-10-CM | POA: Diagnosis not present

## 2014-10-17 DIAGNOSIS — Z01818 Encounter for other preprocedural examination: Secondary | ICD-10-CM | POA: Insufficient documentation

## 2014-10-17 DIAGNOSIS — I739 Peripheral vascular disease, unspecified: Secondary | ICD-10-CM | POA: Diagnosis not present

## 2014-10-17 DIAGNOSIS — I10 Essential (primary) hypertension: Secondary | ICD-10-CM | POA: Diagnosis not present

## 2014-10-17 DIAGNOSIS — I129 Hypertensive chronic kidney disease with stage 1 through stage 4 chronic kidney disease, or unspecified chronic kidney disease: Secondary | ICD-10-CM | POA: Diagnosis not present

## 2014-10-17 DIAGNOSIS — N183 Chronic kidney disease, stage 3 (moderate): Secondary | ICD-10-CM | POA: Diagnosis not present

## 2014-10-17 DIAGNOSIS — Z951 Presence of aortocoronary bypass graft: Secondary | ICD-10-CM | POA: Diagnosis not present

## 2014-10-17 DIAGNOSIS — Z79899 Other long term (current) drug therapy: Secondary | ICD-10-CM | POA: Insufficient documentation

## 2014-10-17 DIAGNOSIS — Z01812 Encounter for preprocedural laboratory examination: Secondary | ICD-10-CM | POA: Insufficient documentation

## 2014-10-17 HISTORY — DX: Cardiac arrhythmia, unspecified: I49.9

## 2014-10-17 LAB — CBC
HEMATOCRIT: 42 % (ref 39.0–52.0)
Hemoglobin: 13.3 g/dL (ref 13.0–17.0)
MCH: 28.1 pg (ref 26.0–34.0)
MCHC: 31.7 g/dL (ref 30.0–36.0)
MCV: 88.6 fL (ref 78.0–100.0)
Platelets: 288 10*3/uL (ref 150–400)
RBC: 4.74 MIL/uL (ref 4.22–5.81)
RDW: 17.7 % — ABNORMAL HIGH (ref 11.5–15.5)
WBC: 9 10*3/uL (ref 4.0–10.5)

## 2014-10-17 LAB — BASIC METABOLIC PANEL
Anion gap: 9 (ref 5–15)
BUN: 24 mg/dL — ABNORMAL HIGH (ref 6–20)
CO2: 25 mmol/L (ref 22–32)
Calcium: 9.8 mg/dL (ref 8.9–10.3)
Chloride: 105 mmol/L (ref 101–111)
Creatinine, Ser: 2.09 mg/dL — ABNORMAL HIGH (ref 0.61–1.24)
GFR, EST AFRICAN AMERICAN: 33 mL/min — AB (ref >60)
GFR, EST NON AFRICAN AMERICAN: 28 mL/min — AB (ref >60)
Glucose, Bld: 113 mg/dL — ABNORMAL HIGH (ref 65–99)
POTASSIUM: 4.8 mmol/L (ref 3.5–5.1)
SODIUM: 139 mmol/L (ref 135–145)

## 2014-10-17 NOTE — Progress Notes (Signed)
Anesthesia Chart Review:  Pt is 81 year old male scheduled for B inguinal hernia repair with mesh on 10/24/2014 with Dr. Evlyn Courier.   PCP is Dr. [name]. Cardiologist is Dr. [name].   PMH includes: CAD (a. s/p CABG 1983. b. 09/2008 - mild inferior ischemia. treated medically), atrial fibrillation, HTN, PVD, carotid artery stenosis, mild aortic stenosis, hyperlipidemia, CKD (stage 3), anemia, bladder cancer (has urostomy), GERD. Former smoker. BMI 22. S/p laparoscopic cystectomy and prostatectomy with node dissection, ileal conduit 10/26/13. S/p cystogram, TURP, TURBT 09/03/13.   Anesthesia history includes post-op nausea and vomiting.   Medications include: eliquis, iron, metoprolol.  Pt to stop eliquis 2 days prior to surgery.   Preoperative labs reviewed.  Cr 2.09, BUN 24. This is consistent with results from 06/2014. Results from a year ago and before cr usually in 1.5 range.   Chest x-ray 06/09/2014 reviewed. Moderate hiatal hernia with mild cardiac enlargement.   EKG 06/09/2014: Atrial flutter with 4:1 A-V conduction. Nonspecific ST abnormality. Prolonged QT  Carotid doppler US 04/10/2014:  -Heterogeneous plaque, bilaterally. -Stable 40-59% RICA stenosis. -Stable 98-42% LICA stenosis. -> 10% RECA stenosis. -Normal subclavian arteries, bilaterally. -Patent vertebral arteries with antegrade flow.  Echo 10/27/2013:  - Left ventricle: The cavity size was normal. Wall thickness was increased in a pattern of mild LVH. Systolic function was normal. The estimated ejection fraction was in the range of 60% to 65%. Wall motion was normal; there were no regional wall motion abnormalities. Features are consistent with a pseudonormal left ventricular filling pattern, with concomitant abnormal relaxation and increased filling pressure (grade 2 diastolic dysfunction). - Aortic valve: Valve mobility was restricted. There was mild stenosis. - Mitral valve: Calcified annulus. There was mild  regurgitation. - Left atrium: The atrium was severely dilated. - Right ventricle: The cavity size was mildly dilated. - Right atrium: The atrium was mildly dilated. - Pulmonary arteries: Systolic pressure was mildly increased. PA peak pressure: 40 mm Hg (S). -Impressions: Normal LV function; grade 2 diastolic dysfunction; biatrial enlargement; mild RVE; mild AS.  By notes, pt had myoview in June 2010 that revealed mild inferior ischemia. Medical treatment was elected.    Pt has cardiac clearance for surgery in letter by Christell Faith, PA-C dated 09/25/14. He is cleared "for non cardiac surgery at a high risk given his comorbidities per the [facility] Criteria. He has a >11% estimated rate of MI, PE, Vfib, cardiac arrest, complete heart block, pulmonary edema, or other arrhythmias."   Reviewed case with Dr. Tamala Julian.   If no changes, I anticipate pt can proceed with surgery as scheduled.   Willeen Cass, FNP-BC [facility]/Anesthesiology Phone: [phone] 10/17/2014 4:21 PM

## 2014-10-23 MED ORDER — CEFAZOLIN SODIUM-DEXTROSE 2-3 GM-% IV SOLR
2.0000 g | INTRAVENOUS | Status: AC
  Administered 2014-10-24: 2 g via INTRAVENOUS

## 2014-10-23 NOTE — H&P (Signed)
[name] 09/25/2014 2:05 PM Location: [facility] Patient #: [id number] DOB: 1931/07/14 Married / Language: English / Race: White Male  History of Present Illness (Gus Littler A. Ninfa Linden MD; 09/25/2014 2:28 PM) Patient words: LTFU discuss hernia sx.  The patient is a 81 year old male who presents with an inguinal hernia. This gentleman is well known to me with bilateral inguinal hernias. When I saw him 2 years ago, we decided to hold on surgery. He is now becoming symptomatic with occasional discomfort in the left inguinal area. Since I saw him last he has had a laparoscopic cystectomy and prostatectomy with ileal conduit. He developed atrial fib at that time and is now on blood thinning medication   Other Problems [name], CMA; 09/25/2014 2:05 PM) Arthritis Atrial Fibrillation Cancer Heart murmur Hemorrhoids Hypercholesterolemia Inguinal Hernia Transfusion history Vascular Disease  Past Surgical History [name], CMA; 09/25/2014 2:05 PM) Colon Polyp Removal - Colonoscopy Coronary Artery Bypass Graft Oral Surgery Resection of Small Bowel Spinal Surgery - Lower Back Tonsillectomy TURP Vasectomy  Diagnostic Studies History [name], CMA; 09/25/2014 2:05 PM) Colonoscopy 1-5 years ago  Allergies [name], CMA; 09/25/2014 2:07 PM) Lovastatin *ANTIHYPERLIPIDEMICS* Xarelto *ANTICOAGULANTS* Zetia *ANTIHYPERLIPIDEMICS*  Medication History [name], CMA; 09/25/2014 2:09 PM) Allopurinol ('100MG'$  Tablet, Oral) Active. Colchicine (0.'6MG'$  Tablet, Oral) Active. Eliquis ('5MG'$  Tablet, Oral) Active. Ferrous Sulfate (325 (65 Fe)MG Tablet, Oral) Active. Fluocinonide (0.05% Cream, External) Active. Omeprazole ('40MG'$  Capsule DR, Oral) Active. Polyethylene Glycol 3350 (Oral) Active. Triamcinolone Acetonide (0.1% Cream, External) Active. Metoprolol Tartrate ('25MG'$  Tablet, Oral) Active. Vitamin B-12 (100MCG Tablet, Oral)  Active. Allegra Allergy ('180MG'$  Tablet, Oral) Active. Kenalog (0.1% Cream, External) Active. Medications Reconciled  Social History [name], [place]; 09/25/2014 2:05 PM) Alcohol use Occasional alcohol use. Caffeine use Carbonated beverages, Tea. No drug use Tobacco use Former smoker.  Family History [name], [place]; 09/25/2014 2:05 PM) Alcohol Abuse Father. Arthritis Mother. Colon Polyps Mother. Heart Disease Father, Mother. Heart disease in male family member before age 40 Hypertension Mother.  Review of Systems [name] CMA; 09/25/2014 2:05 PM) Skin Not Present- Change in Wart/Mole, Dryness, Hives, Jaundice, New Lesions, Non-Healing Wounds, Rash and Ulcer. HEENT Present- Seasonal Allergies. Not Present- Earache, Hearing Loss, Hoarseness, Nose Bleed, Oral Ulcers, Ringing in the Ears, Sinus Pain, Sore Throat, Visual Disturbances, Wears glasses/contact lenses and Yellow Eyes. Breast Not Present- Breast Mass, Breast Pain, Nipple Discharge and Skin Changes. Cardiovascular Present- Rapid Heart Rate. Not Present- Chest Pain, Difficulty Breathing Lying Down, Leg Cramps, Palpitations, Shortness of Breath and Swelling of Extremities. Gastrointestinal Present- Abdominal Pain and Hemorrhoids. Not Present- Bloating, Bloody Stool, Change in Bowel Habits, Chronic diarrhea, Constipation, Difficulty Swallowing, Excessive gas, Gets full quickly at meals, Indigestion, Nausea, Rectal Pain and Vomiting. Male Genitourinary Not Present- Blood in Urine, Change in Urinary Stream, Frequency, Impotence, Nocturia, Painful Urination, Urgency and Urine Leakage. Neurological Present- Numbness. Not Present- Decreased Memory, Fainting, Headaches, Seizures, Tingling, Tremor, Trouble walking and Weakness. Hematology Present- Easy Bruising. Not Present- Excessive bleeding, Gland problems, HIV and Persistent Infections.   Vitals [name] CMA; 09/25/2014 2:10 PM) 09/25/2014 2:10 PM Weight: 166 lb  Height: 72in Body Surface Area: 1.96 m Body Mass Index: 22.51 kg/m Temp.: 98.15F(Oral)  Pulse: 98 (Regular)  Resp.: 17 (Unlabored)  BP: 126/78 (Sitting, Left Arm, Standard)    Physical Exam (Brynli Ollis A. Ninfa Linden MD; 09/25/2014 2:28 PM) The physical exam findings are as follows: Lungs clear CV RRR Abdomen soft, NT/ND Note:On exam, he does have bilateral inguinal hernias  with the left being larger than the right. They are both easily reducible    Assessment & Plan (Daizha Anand A. Ninfa Linden MD; 09/25/2014 2:29 PM) BILATERAL INGUINAL HERNIA (550.92  K40.20)  Will proceed with bilateral open inguinal hernia repair with mesh.  I discussed the risks which include but are not limited to bleeding, infection, injury to surrounding structures, chronic pain, recurrence, cardiopulmonary problems, etc.  He agrees to proceed.

## 2014-10-24 ENCOUNTER — Ambulatory Visit (HOSPITAL_COMMUNITY): Payer: Commercial Managed Care - HMO | Admitting: Anesthesiology

## 2014-10-24 ENCOUNTER — Ambulatory Visit (HOSPITAL_COMMUNITY): Payer: Commercial Managed Care - HMO | Admitting: Emergency Medicine

## 2014-10-24 ENCOUNTER — Observation Stay (HOSPITAL_COMMUNITY)
Admission: RE | Admit: 2014-10-24 | Discharge: 2014-10-25 | Disposition: A | Discharge status: Home / Self Care | Payer: Commercial Managed Care - HMO | Source: Ambulatory Visit | Attending: Surgery | Admitting: Surgery

## 2014-10-24 ENCOUNTER — Encounter (HOSPITAL_COMMUNITY)
Admission: RE | Disposition: A | Discharge status: Home / Self Care | Payer: Self-pay | Source: Ambulatory Visit | Attending: Surgery

## 2014-10-24 ENCOUNTER — Encounter (HOSPITAL_COMMUNITY): Payer: Self-pay | Admitting: Certified Registered"

## 2014-10-24 DIAGNOSIS — I4891 Unspecified atrial fibrillation: Secondary | ICD-10-CM | POA: Diagnosis not present

## 2014-10-24 DIAGNOSIS — I999 Unspecified disorder of circulatory system: Secondary | ICD-10-CM | POA: Diagnosis not present

## 2014-10-24 DIAGNOSIS — E780 Pure hypercholesterolemia: Secondary | ICD-10-CM | POA: Insufficient documentation

## 2014-10-24 DIAGNOSIS — I10 Essential (primary) hypertension: Secondary | ICD-10-CM | POA: Diagnosis not present

## 2014-10-24 DIAGNOSIS — Z79899 Other long term (current) drug therapy: Secondary | ICD-10-CM | POA: Diagnosis not present

## 2014-10-24 DIAGNOSIS — M1990 Unspecified osteoarthritis, unspecified site: Secondary | ICD-10-CM | POA: Insufficient documentation

## 2014-10-24 DIAGNOSIS — I739 Peripheral vascular disease, unspecified: Secondary | ICD-10-CM | POA: Insufficient documentation

## 2014-10-24 DIAGNOSIS — Z888 Allergy status to other drugs, medicaments and biological substances status: Secondary | ICD-10-CM | POA: Diagnosis not present

## 2014-10-24 DIAGNOSIS — K219 Gastro-esophageal reflux disease without esophagitis: Secondary | ICD-10-CM | POA: Diagnosis not present

## 2014-10-24 DIAGNOSIS — Z87891 Personal history of nicotine dependence: Secondary | ICD-10-CM | POA: Insufficient documentation

## 2014-10-24 DIAGNOSIS — K4020 Bilateral inguinal hernia, without obstruction or gangrene, not specified as recurrent: Secondary | ICD-10-CM | POA: Diagnosis not present

## 2014-10-24 DIAGNOSIS — I2510 Atherosclerotic heart disease of native coronary artery without angina pectoris: Secondary | ICD-10-CM | POA: Insufficient documentation

## 2014-10-24 HISTORY — PX: INGUINAL HERNIA REPAIR: SHX194

## 2014-10-24 LAB — PROTIME-INR
INR: 1.02 (ref 0.00–1.49)
PROTHROMBIN TIME: 13.6 s (ref 11.6–15.2)

## 2014-10-24 SURGERY — REPAIR, HERNIA, INGUINAL, BILATERAL, ADULT
Anesthesia: General | Site: Groin | Laterality: Bilateral

## 2014-10-24 MED ORDER — PANTOPRAZOLE SODIUM 40 MG PO TBEC
40.0000 mg | DELAYED_RELEASE_TABLET | Freq: Every day | ORAL | Status: DC
  Administered 2014-10-25: 40 mg via ORAL
  Filled 2014-10-24: qty 1

## 2014-10-24 MED ORDER — MORPHINE SULFATE 2 MG/ML IJ SOLN: 1.0000 mg | INTRAMUSCULAR | Status: DC | PRN

## 2014-10-24 MED ORDER — ROCURONIUM BROMIDE 50 MG/5ML IV SOLN
INTRAVENOUS | Status: AC
  Filled 2014-10-24: qty 1

## 2014-10-24 MED ORDER — LACTATED RINGERS IV SOLN
INTRAVENOUS | Status: DC | PRN
  Administered 2014-10-24 (×2): via INTRAVENOUS

## 2014-10-24 MED ORDER — LIDOCAINE HCL (CARDIAC) 20 MG/ML IV SOLN
INTRAVENOUS | Status: DC | PRN
Start: 2014-10-24 — End: 2014-10-24
  Administered 2014-10-24: 100 mg via INTRAVENOUS

## 2014-10-24 MED ORDER — PHENYLEPHRINE 40 MCG/ML (10ML) SYRINGE FOR IV PUSH (FOR BLOOD PRESSURE SUPPORT)
PREFILLED_SYRINGE | INTRAVENOUS | Status: AC
  Filled 2014-10-24: qty 10

## 2014-10-24 MED ORDER — EPHEDRINE SULFATE 50 MG/ML IJ SOLN
INTRAMUSCULAR | Status: DC | PRN
  Administered 2014-10-24 (×2): 10 mg via INTRAVENOUS

## 2014-10-24 MED ORDER — ONDANSETRON HCL 4 MG/2ML IJ SOLN: 4.0000 mg | Freq: Four times a day (QID) | INTRAMUSCULAR | Status: DC | PRN

## 2014-10-24 MED ORDER — DEXAMETHASONE SODIUM PHOSPHATE 4 MG/ML IJ SOLN
INTRAMUSCULAR | Status: DC | PRN
  Administered 2014-10-24: 4 mg via INTRAVENOUS

## 2014-10-24 MED ORDER — SODIUM CHLORIDE 0.9 % IJ SOLN
INTRAMUSCULAR | Status: AC
  Filled 2014-10-24: qty 10

## 2014-10-24 MED ORDER — ONDANSETRON HCL 4 MG/2ML IJ SOLN
INTRAMUSCULAR | Status: DC | PRN
  Administered 2014-10-24: 4 mg via INTRAVENOUS

## 2014-10-24 MED ORDER — PROMETHAZINE HCL 25 MG/ML IJ SOLN
12.5000 mg | INTRAMUSCULAR | Status: AC
  Administered 2014-10-24: 12.5 mg via INTRAVENOUS
  Filled 2014-10-24: qty 1

## 2014-10-24 MED ORDER — BUPIVACAINE-EPINEPHRINE (PF) 0.5% -1:200000 IJ SOLN
INTRAMUSCULAR | Status: AC
  Filled 2014-10-24: qty 30

## 2014-10-24 MED ORDER — ONDANSETRON HCL 4 MG/2ML IJ SOLN
INTRAMUSCULAR | Status: AC
  Filled 2014-10-24: qty 2

## 2014-10-24 MED ORDER — BUPIVACAINE-EPINEPHRINE (PF) 0.5% -1:200000 IJ SOLN
INTRAMUSCULAR | Status: AC
  Filled 2014-10-24: qty 60

## 2014-10-24 MED ORDER — PROPOFOL 10 MG/ML IV BOLUS
INTRAVENOUS | Status: AC
  Filled 2014-10-24: qty 20

## 2014-10-24 MED ORDER — SUCCINYLCHOLINE CHLORIDE 20 MG/ML IJ SOLN
INTRAMUSCULAR | Status: AC
  Filled 2014-10-24: qty 1

## 2014-10-24 MED ORDER — ALLOPURINOL 100 MG PO TABS
100.0000 mg | ORAL_TABLET | Freq: Two times a day (BID) | ORAL | Status: DC
  Administered 2014-10-24 – 2014-10-25 (×3): 100 mg via ORAL
  Filled 2014-10-24 (×3): qty 1

## 2014-10-24 MED ORDER — PHENYLEPHRINE HCL 10 MG/ML IJ SOLN
INTRAMUSCULAR | Status: DC | PRN
  Administered 2014-10-24 (×3): 80 ug via INTRAVENOUS
  Administered 2014-10-24: 120 ug via INTRAVENOUS
  Administered 2014-10-24: 80 ug via INTRAVENOUS

## 2014-10-24 MED ORDER — PROMETHAZINE HCL 25 MG/ML IJ SOLN: 6.2500 mg | INTRAMUSCULAR | Status: DC | PRN

## 2014-10-24 MED ORDER — LIDOCAINE HCL (CARDIAC) 20 MG/ML IV SOLN
INTRAVENOUS | Status: AC
  Filled 2014-10-24: qty 5

## 2014-10-24 MED ORDER — BUPIVACAINE-EPINEPHRINE 0.5% -1:200000 IJ SOLN
INTRAMUSCULAR | Status: DC | PRN
  Administered 2014-10-24: 30 mL

## 2014-10-24 MED ORDER — PROPOFOL 10 MG/ML IV BOLUS
INTRAVENOUS | Status: DC | PRN
  Administered 2014-10-24: 150 mg via INTRAVENOUS

## 2014-10-24 MED ORDER — MECLIZINE HCL 25 MG PO TABS
25.0000 mg | ORAL_TABLET | Freq: Three times a day (TID) | ORAL | Status: DC | PRN
  Filled 2014-10-24: qty 1

## 2014-10-24 MED ORDER — METOPROLOL TARTRATE 25 MG PO TABS
25.0000 mg | ORAL_TABLET | Freq: Two times a day (BID) | ORAL | Status: DC
  Administered 2014-10-24 – 2014-10-25 (×2): 25 mg via ORAL
  Filled 2014-10-24 (×2): qty 1

## 2014-10-24 MED ORDER — ONDANSETRON HCL 4 MG PO TABS
4.0000 mg | ORAL_TABLET | Freq: Four times a day (QID) | ORAL | Status: DC | PRN
Start: 2014-10-24 — End: 2014-10-25

## 2014-10-24 MED ORDER — SODIUM CHLORIDE 0.9 % IV SOLN
INTRAVENOUS | Status: DC
  Administered 2014-10-24: 13:00:00 via INTRAVENOUS

## 2014-10-24 MED ORDER — HYDROMORPHONE HCL 1 MG/ML IJ SOLN: 0.2500 mg | INTRAMUSCULAR | Status: DC | PRN

## 2014-10-24 MED ORDER — HYDROCODONE-ACETAMINOPHEN 5-325 MG PO TABS
1.0000 | ORAL_TABLET | ORAL | Status: DC | PRN
  Administered 2014-10-24 – 2014-10-25 (×2): 1 via ORAL
  Filled 2014-10-24 (×2): qty 1

## 2014-10-24 MED ORDER — EPHEDRINE SULFATE 50 MG/ML IJ SOLN
INTRAMUSCULAR | Status: AC
  Filled 2014-10-24: qty 1

## 2014-10-24 MED ORDER — FENTANYL CITRATE (PF) 250 MCG/5ML IJ SOLN
INTRAMUSCULAR | Status: DC | PRN
  Administered 2014-10-24: 100 ug via INTRAVENOUS

## 2014-10-24 MED ORDER — FENTANYL CITRATE (PF) 250 MCG/5ML IJ SOLN
INTRAMUSCULAR | Status: AC
  Filled 2014-10-24: qty 5

## 2014-10-24 SURGICAL SUPPLY — 40 items
BLADE SURG ROTATE 9660 (MISCELLANEOUS) IMPLANT
CHLORAPREP W/TINT 26ML (MISCELLANEOUS) ×3 IMPLANT
COVER SURGICAL LIGHT HANDLE (MISCELLANEOUS) ×3 IMPLANT
DRAIN PENROSE 1/2X12 LTX STRL (WOUND CARE) ×2 IMPLANT
DRAPE LAPAROTOMY TRNSV 102X78 (DRAPE) ×3 IMPLANT
DRAPE UTILITY XL STRL (DRAPES) ×6 IMPLANT
DRESSING TELFA 8X3 (GAUZE/BANDAGES/DRESSINGS) ×3 IMPLANT
DRSG TEGADERM 4X4.75 (GAUZE/BANDAGES/DRESSINGS) ×3 IMPLANT
ELECT CAUTERY BLADE 6.4 (BLADE) ×3 IMPLANT
ELECT REM PT RETURN 9FT ADLT (ELECTROSURGICAL) ×3
ELECTRODE REM PT RTRN 9FT ADLT (ELECTROSURGICAL) ×1 IMPLANT
GLOVE BIO SURGEON STRL SZ7 (GLOVE) ×2 IMPLANT
GLOVE SURG SIGNA 7.5 PF LTX (GLOVE) ×3 IMPLANT
GOWN STRL REUS W/ TWL LRG LVL3 (GOWN DISPOSABLE) ×1 IMPLANT
GOWN STRL REUS W/ TWL XL LVL3 (GOWN DISPOSABLE) ×1 IMPLANT
GOWN STRL REUS W/TWL LRG LVL3 (GOWN DISPOSABLE) ×3
GOWN STRL REUS W/TWL XL LVL3 (GOWN DISPOSABLE) ×3
KIT BASIN OR (CUSTOM PROCEDURE TRAY) ×3 IMPLANT
KIT ROOM TURNOVER OR (KITS) ×3 IMPLANT
LIQUID BAND (GAUZE/BANDAGES/DRESSINGS) ×3 IMPLANT
MESH PARIETEX PROGRIP LEFT (Mesh General) ×2 IMPLANT
MESH PARIETEX PROGRIP RIGHT (Mesh General) ×2 IMPLANT
NDL HYPO 25GX1X1/2 BEV (NEEDLE) ×1 IMPLANT
NEEDLE HYPO 25GX1X1/2 BEV (NEEDLE) ×3 IMPLANT
NS IRRIG 1000ML POUR BTL (IV SOLUTION) ×3 IMPLANT
PACK SURGICAL SETUP 50X90 (CUSTOM PROCEDURE TRAY) ×3 IMPLANT
PAD ARMBOARD 7.5X6 YLW CONV (MISCELLANEOUS) ×6 IMPLANT
PENCIL BUTTON HOLSTER BLD 10FT (ELECTRODE) ×3 IMPLANT
SPECIMEN JAR SMALL (MISCELLANEOUS) IMPLANT
SPONGE LAP 18X18 X RAY DECT (DISPOSABLE) ×3 IMPLANT
SUT MON AB 4-0 PC3 18 (SUTURE) ×5 IMPLANT
SUT SILK 2 0 SH (SUTURE) ×2 IMPLANT
SUT VIC AB 2-0 CT1 27 (SUTURE) ×12
SUT VIC AB 2-0 CT1 TAPERPNT 27 (SUTURE) ×2 IMPLANT
SUT VIC AB 3-0 CT1 27 (SUTURE) ×3
SUT VIC AB 3-0 CT1 TAPERPNT 27 (SUTURE) ×1 IMPLANT
SYR CONTROL 10ML LL (SYRINGE) ×3 IMPLANT
TOWEL OR 17X24 6PK STRL BLUE (TOWEL DISPOSABLE) ×3 IMPLANT
TOWEL OR 17X26 10 PK STRL BLUE (TOWEL DISPOSABLE) ×3 IMPLANT
WATER STERILE IRR 1000ML POUR (IV SOLUTION) IMPLANT

## 2014-10-24 NOTE — Op Note (Signed)
BILATERAL INGUINAL HERNIA REPAIR WITH MESH  Procedure Note  [name]. 10/24/2014   Pre-op Diagnosis: Bilateral Inguinal Hernias     Post-op Diagnosis: same  Procedure(s): BILATERAL INGUINAL HERNIA REPAIR WITH MESH  Surgeon(s): Coralie Keens, MD  Anesthesia: General  Staff:  Circulator: Beverlee Nims, RN; Christen Bame, RN Scrub Person: Milas Kocher, RN  Estimated Blood Loss: Minimal                         [name]   Date: 10/24/2014  Time: 8:23 AM

## 2014-10-24 NOTE — Anesthesia Procedure Notes (Signed)
Procedure Name: LMA Insertion Date/Time: 10/24/2014 7:32 AM Performed by: Julian Reil Pre-anesthesia Checklist: Patient identified, Emergency Drugs available, Suction available and Patient being monitored Patient Re-evaluated:Patient Re-evaluated prior to inductionOxygen Delivery Method: Circle system utilized Preoxygenation: Pre-oxygenation with 100% oxygen Intubation Type: IV induction LMA: LMA inserted LMA Size: 5.0 Tube type: Oral Number of attempts: 1 Placement Confirmation: positive ETCO2 and breath sounds checked- equal and bilateral Tube secured with: Tape Dental Injury: Teeth and Oropharynx as per pre-operative assessment

## 2014-10-24 NOTE — Transfer of Care (Signed)
Immediate Anesthesia Transfer of Care Note  Patient: [name].  Procedure(s) Performed: Procedure(s): BILATERAL INGUINAL HERNIA REPAIR WITH MESH (Bilateral)  Patient Location: PACU  Anesthesia Type:General  Level of Consciousness: awake, alert , oriented and patient cooperative  Airway & Oxygen Therapy: Patient Spontanous Breathing and Patient connected to nasal cannula oxygen  Post-op Assessment: Report given to RN, Post -op Vital signs reviewed and stable and Patient moving all extremities  Post vital signs: Reviewed and stable  Last Vitals:  Filed Vitals:   10/24/14 0623  BP: 175/99  Pulse: 65  Temp: 36.4 C  Resp: 20    Complications: No apparent anesthesia complications

## 2014-10-24 NOTE — Interval H&P Note (Signed)
History and Physical Interval Note: no change in H and P  10/24/2014 7:16 AM  [name].  has presented today for surgery, with the diagnosis of Bilateral Inguinal Hernias  The various methods of treatment have been discussed with the patient and family. After consideration of risks, benefits and other options for treatment, the patient has consented to  Procedure(s): BILATERAL INGUINAL HERNIA REPAIR WITH MESH (Bilateral) as a surgical intervention .  The patient's history has been reviewed, patient examined, no change in status, stable for surgery.  I have reviewed the patient's chart and labs.  Questions were answered to the patient's satisfaction.     [name]

## 2014-10-24 NOTE — Progress Notes (Signed)
Patient present on floor from PACU. Alert and orientedx4.

## 2014-10-24 NOTE — Progress Notes (Signed)
Pt arrived in pacu with iv infusing

## 2014-10-24 NOTE — Patient Outreach (Signed)
[facility]) Care Management  10/24/2014  [name]. [birth year] [id number]   Referral from [facility] Tier 4 list, assigned to Maury Dus, RN for patient outreach.  [name] L. [name] [facility] Care Management Assistant [phone]

## 2014-10-24 NOTE — Anesthesia Preprocedure Evaluation (Addendum)
Anesthesia Evaluation  Patient identified by MRN, date of birth, ID band Patient awake    Reviewed: Allergy & Precautions, NPO status , Patient's Chart, lab work & pertinent test results  History of Anesthesia Complications (+) PONV and history of anesthetic complications  Airway Mallampati: II  TM Distance: >3 FB Neck ROM: Full    Dental  (+) Teeth Intact, Dental Advisory Given   Pulmonary former smoker,    Pulmonary exam normal       Cardiovascular hypertension, Pt. on medications + CAD and + Peripheral Vascular Disease Rhythm:Irregular Rate:Normal + Systolic murmurs Echo 04/19/5206: mild LVH, EF 60-65%, GR 2 DD; Aortic Sclerosis / mild stenosis; Severe LAE, mild RAE; mild PA HTN - 40 mmHg   Neuro/Psych PSYCHIATRIC DISORDERS Anxiety negative neurological ROS     GI/Hepatic Neg liver ROS, hiatal hernia, GERD-  ,  Endo/Other  negative endocrine ROS  Renal/GU Renal disease     Musculoskeletal   Abdominal   Peds  Hematology   Anesthesia Other Findings   Reproductive/Obstetrics                           Anesthesia Physical Anesthesia Plan  ASA: III  Anesthesia Plan: General   Post-op Pain Management:    Induction: Intravenous  Airway Management Planned: LMA  Additional Equipment:   Intra-op Plan:   Post-operative Plan: Extubation in OR  Informed Consent: I have reviewed the patients History and Physical, chart, labs and discussed the procedure including the risks, benefits and alternatives for the proposed anesthesia with the patient or authorized representative who has indicated his/her understanding and acceptance.   Dental advisory given  Plan Discussed with: CRNA, Anesthesiologist and Surgeon  Anesthesia Plan Comments:        Anesthesia Quick Evaluation

## 2014-10-24 NOTE — Op Note (Signed)
[name], [name] NO.:  [id number]  MEDICAL RECORD NO.:  [id number]  LOCATION:  [facility]                         FACILITY:  [facility]  PHYSICIAN:  Coralie Keens, M.D. DATE OF BIRTH:  18-Jul-1931  DATE OF PROCEDURE:  10/24/2014 DATE OF DISCHARGE:                              OPERATIVE REPORT   PREOPERATIVE DIAGNOSIS:  Bilateral inguinal hernias.  POSTOPERATIVE DIAGNOSIS:  Bilateral inguinal hernias.  PROCEDURE:  Bilateral inguinal hernia repair with mesh.  SURGEON:  Coralie Keens, MD  ANESTHESIA:  General with 0.5% Marcaine with epinephrine.  ESTIMATED BLOOD LOSS:  Minimal.  INDICATIONS:  This is an 81 year old gentleman with symptomatic bilateral inguinal hernias.  He has had a previous urostomy, so decision was made to proceed with open repair of the hernias.  His blood thinning medications had been inhaled.  FINDINGS:  The patient was found to have a right direct inguinal hernia and a left indirect and direct inguinal hernia.  Both were repaired with 2 separate pieces of Proceed ProGrip Prolene mesh.  PROCEDURE IN DETAIL:  The patient was brought to the operating room, identified as [name].  He was placed supine on the operating room table.  General anesthesia was induced.  His lower abdomen was then prepped and draped in usual sterile fashion.  I performed a left ilioinguinal nerve block first with Marcaine.  I then anesthetized the skin with Marcaine as well.  I made a longitudinal incision in the left groin with a scalpel.  I took this down through Scarpa's fascia with electrocautery.  The external oblique fascia was then identified and opened through the internal and external rings.  The testicular cord and structures were controlled with Penrose drain.  The patient had a very small direct hernia defect as well as a moderate indirect hernia sac.  I separated the sac from the testicular cord, dissected down at the base. The contents had been  reduced into the abdominal cavity.  I tied off the base with a silk suture and excised the redundant sac.  A piece of Proceed ProGrip left-sided mesh from [employer] was brought onto the field.  I placed it on the inguinal floor and brought around the cord structures. I then sutured in place with 2-0 Vicryl sutures.  Good coverage of the inguinal floor and cord structures appeared to be achieved.  I then closed the external oblique fascia over the top of this with a running 2- 0 Vicryl suture.  I anesthetized the fascia further with Marcaine.  I then closed Scarpa's fascia with interrupted 3-0 Vicryl sutures and closed the skin with a running 4-0 Monocryl.  I then turned my attention towards the right inguinal area.  I again performed a right ilioinguinal nerve block with Marcaine.  I anesthetized the skin with Marcaine as well.  I made a longitudinal incision in the right groin matching the left side with a scalpel.  I took this down through Scarpa's fascia with electrocautery.  External oblique fascia was then opened toward the internal and external rings. The testicular cord and structures were controlled with Penrose drain again.  There was no evidence of indirect hernia, but he had  a large direct hernia sac.  I was able to reduce this back into the abdominal cavity.  I then freed up the sac and imbricated it.  I then placed a piece of Proceed ProGrip right-sided Prolene mesh as an onlay on the inguinal floor and placed it around the cord structures.  I then sewed it in place with 2-0 Vicryl sutures as well.  Good coverage of the inguinal floor appeared to be achieved.  I then closed the external oblique fascia over the top of this with a 2-0 Vicryl suture.  I again anesthetized the fascia further with Marcaine.  I closed the subcutaneous tissue with interrupted 3-0 Vicryl sutures and closed the skin with a running 4-0 Monocryl suture.  Skin glue was then placed in both incisions.  The  patient tolerated the procedure well.  All the counts were correct at the end of procedure.  The patient was then extubated in operating room and taken in stable condition to recovery room.     Coralie Keens, M.D.   ______________________________ Coralie Keens, M.D.    DB/MEDQ  D:  10/24/2014  T:  10/24/2014  Job:  [id number]

## 2014-10-24 NOTE — Anesthesia Postprocedure Evaluation (Signed)
Anesthesia Post Note  Patient: [name].  Procedure(s) Performed: Procedure(s) (LRB): BILATERAL INGUINAL HERNIA REPAIR WITH MESH (Bilateral)  Anesthesia type: general  Patient location: PACU  Post pain: Pain level controlled  Post assessment: Patient's Cardiovascular Status Stable  Last Vitals:  Filed Vitals:   10/24/14 0915  BP: 163/81  Pulse: 74  Temp:   Resp:     Post vital signs: Reviewed and stable  Level of consciousness: sedated  Complications: No apparent anesthesia complications

## 2014-10-25 ENCOUNTER — Encounter (HOSPITAL_COMMUNITY): Payer: Self-pay | Admitting: Surgery

## 2014-10-25 DIAGNOSIS — Z79899 Other long term (current) drug therapy: Secondary | ICD-10-CM | POA: Diagnosis not present

## 2014-10-25 DIAGNOSIS — M1990 Unspecified osteoarthritis, unspecified site: Secondary | ICD-10-CM | POA: Diagnosis not present

## 2014-10-25 DIAGNOSIS — I4891 Unspecified atrial fibrillation: Secondary | ICD-10-CM | POA: Diagnosis not present

## 2014-10-25 DIAGNOSIS — E780 Pure hypercholesterolemia: Secondary | ICD-10-CM | POA: Diagnosis not present

## 2014-10-25 DIAGNOSIS — I2510 Atherosclerotic heart disease of native coronary artery without angina pectoris: Secondary | ICD-10-CM | POA: Diagnosis not present

## 2014-10-25 DIAGNOSIS — I999 Unspecified disorder of circulatory system: Secondary | ICD-10-CM | POA: Diagnosis not present

## 2014-10-25 DIAGNOSIS — Z888 Allergy status to other drugs, medicaments and biological substances status: Secondary | ICD-10-CM | POA: Diagnosis not present

## 2014-10-25 DIAGNOSIS — Z87891 Personal history of nicotine dependence: Secondary | ICD-10-CM | POA: Diagnosis not present

## 2014-10-25 DIAGNOSIS — K4020 Bilateral inguinal hernia, without obstruction or gangrene, not specified as recurrent: Secondary | ICD-10-CM | POA: Diagnosis not present

## 2014-10-25 LAB — GLUCOSE, CAPILLARY: GLUCOSE-CAPILLARY: 85 mg/dL (ref 65–99)

## 2014-10-25 MED ORDER — HYDROCODONE-ACETAMINOPHEN 5-325 MG PO TABS: 1.0000 | ORAL_TABLET | Freq: Four times a day (QID) | ORAL | Status: DC | PRN

## 2014-10-25 NOTE — Discharge Instructions (Signed)
[facility] _______[facility]  UMBILICAL OR INGUINAL HERNIA REPAIR: POST OP INSTRUCTIONS  Always review your discharge instruction sheet given to you by the facility where your surgery was performed. IF YOU HAVE DISABILITY OR FAMILY LEAVE FORMS, YOU MUST BRING THEM TO THE OFFICE FOR PROCESSING.   DO NOT GIVE THEM TO YOUR DOCTOR.  1. A  prescription for pain medication may be given to you upon discharge.  Take your pain medication as prescribed, if needed.  If narcotic pain medicine is not needed, then you may take acetaminophen (Tylenol) or ibuprofen (Advil) as needed. 2. Take your usually prescribed medications unless otherwise directed. 3. If you need a refill on your pain medication, please contact your pharmacy.  They will contact our office to request authorization. Prescriptions will not be filled after 5 pm or on week-ends. 4. You should follow a light diet the first 24 hours after arrival home, such as soup and crackers, etc.  Be sure to include lots of fluids daily.  Resume your normal diet the day after surgery. 5. Most patients will experience some swelling and bruising around the umbilicus or in the groin and scrotum.  Ice packs and reclining will help.  Swelling and bruising can take several days to resolve.  6. It is common to experience some constipation if taking pain medication after surgery.  Increasing fluid intake and taking a stool softener (such as Colace) will usually help or prevent this problem from occurring.  A mild laxative (Milk of Magnesia or Miralax) should be taken according to package directions if there are no bowel movements after 48 hours. 7. Unless discharge instructions indicate otherwise, you may remove your bandages 24-48 hours after surgery, and you may shower at that time.  You may have steri-strips (small skin tapes) in place directly over the incision.  These strips should be left on the skin for 7-10 days.  If your surgeon used skin glue on the  incision, you may shower in 24 hours.  The glue will flake off over the next 2-3 weeks.  Any sutures or staples will be removed at the office during your follow-up visit. 8. ACTIVITIES:  You may resume regular (light) daily activities beginning the next day--such as daily self-care, walking, climbing stairs--gradually increasing activities as tolerated.  You may have sexual intercourse when it is comfortable.  Refrain from any heavy lifting or straining until approved by your doctor. a. You may drive when you are no longer taking prescription pain medication, you can comfortably wear a seatbelt, and you can safely maneuver your car and apply brakes. b. RETURN TO WORK:  __________________________________________________________ 9. You should see your doctor in the office for a follow-up appointment approximately 2-3 weeks after your surgery.  Make sure that you call for this appointment within a day or two after you arrive home to insure a convenient appointment time. 10. OTHER INSTRUCTIONS:  __________________________________________________________________________________________________________________________________________________________________________________________  WHEN TO CALL YOUR DOCTOR: 1. Fever over 101.0 2. Inability to urinate 3. Nausea and/or vomiting 4. Extreme swelling or bruising 5. Continued bleeding from incision. 6. Increased pain, redness, or drainage from the incision  The clinic staff is available to answer your questions during regular business hours.  Please don't hesitate to call and ask to speak to one of the nurses for clinical concerns.  If you have a medical emergency, go to the nearest emergency room or call 911.  A surgeon from [facility] is always on call at the hospital     [address], [address], [place], [place]  [address] ?  [address] [address], [place], [place]   [address] [phone] ? [phone] ? FAX [phone] Web site:  [email or web address]  

## 2014-10-25 NOTE — Progress Notes (Signed)
Patient ID: Kenneth Solian., male   DOB: [birth year], 81 y.o.   MRN: [id number]  Doing well  abd soft, non hematoma  Plan Discharge home

## 2014-10-25 NOTE — Discharge Summary (Signed)
Physician Discharge Summary  Patient ID: [name]. MRN: [id number] DOB/AGE: 07-06-1931 81 y.o.  Admit date: 10/24/2014 Discharge date: 10/25/2014  Admission Diagnoses:  Discharge Diagnoses:  Active Problems:   Bilateral inguinal hernia   Discharged Condition: good  Hospital Course: uneventful post op recovery.  Discharge POD#1  Consults: None  Significant Diagnostic Studies:   Treatments: surgery: bilateral inguinal hernia repair with mesh  Discharge Exam: Blood pressure 171/90, pulse 74, temperature 97.8 F (36.6 C), temperature source Oral, resp. rate 19, weight 76.295 kg (168 lb 3.2 oz), SpO2 98 %. General appearance: alert, cooperative and no distress Resp: clear to auscultation bilaterally Incision/Wound:clean, non hematoma  Disposition: 01-Home or Self Care     Medication List    TAKE these medications        allopurinol 100 MG tablet  Commonly known as:  ZYLOPRIM  Take 1 tablet (100 mg total) by mouth 2 (two) times daily.     apixaban 5 MG Tabs tablet  Commonly known as:  ELIQUIS  Take 1 tablet (5 mg total) by mouth 2 (two) times daily.     cholecalciferol 1000 UNITS tablet  Commonly known as:  VITAMIN D  Take 1,000 Units by mouth every other day.     colchicine 0.6 MG tablet  Take 1 tablet (0.6 mg total) by mouth 2 (two) times daily.     cyanocobalamin 1000 MCG tablet  Take 1 tablet (1,000 mcg total) by mouth daily.     ferrous sulfate 325 (65 FE) MG tablet  Take 1 tablet (325 mg total) by mouth 2 (two) times daily with a meal.     fexofenadine 180 MG tablet  Commonly known as:  ALLEGRA ALLERGY  Take 1 tablet (180 mg total) by mouth daily.     fluocinonide cream 0.05 %  Commonly known as:  LIDEX  Apply 1 application topically 2 (two) times daily as needed.     fluticasone 50 MCG/ACT nasal spray  Commonly known as:  FLONASE  Place 2 sprays into both nostrils daily.     HYDROcodone-acetaminophen 5-325 MG per tablet  Commonly known as:   NORCO  Take 1-2 tablets by mouth every 6 (six) hours as needed for moderate pain.     hydroxypropyl methylcellulose / hypromellose 2.5 % ophthalmic solution  Commonly known as:  ISOPTO TEARS / GONIOVISC  Place 1 drop into both eyes 3 (three) times daily as needed for dry eyes.     meclizine 25 MG tablet  Commonly known as:  ANTIVERT  Take 25 mg by mouth 3 (three) times daily as needed for dizziness.     metoprolol tartrate 25 MG tablet  Commonly known as:  LOPRESSOR  Take 1 tablet (25 mg total) by mouth 2 (two) times daily.     nitroGLYCERIN 0.4 MG SL tablet  Commonly known as:  NITROSTAT  Place 1 tablet (0.4 mg total) under the tongue every 5 (five) minutes as needed for chest pain.     omeprazole 40 MG capsule  Commonly known as:  PRILOSEC  TAKE 1 CAPSULE (40 MG TOTAL) BY MOUTH DAILY.     polyethylene glycol packet  Commonly known as:  MIRALAX / GLYCOLAX  Take 17 g by mouth daily as needed for mild constipation.     senna-docusate 8.6-50 MG per tablet  Commonly known as:  Senokot-S  Take 1 tablet by mouth at bedtime as needed for mild constipation. While taking pain meds.     triamcinolone cream 0.1 %  Commonly known as:  KENALOG  Apply 1 application topically daily as needed (itching).           Follow-up Information    Follow up with [facility], MD. Schedule an appointment as soon as possible for a visit in 2 weeks.   Specialty:  General Surgery   Contact information:   [address] [place] [address] [phone]       Signed: Harl Bowie 10/25/2014, 8:18 AM

## 2014-11-05 ENCOUNTER — Other Ambulatory Visit: Payer: Self-pay

## 2014-11-05 DIAGNOSIS — Z932 Ileostomy status: Secondary | ICD-10-CM | POA: Diagnosis not present

## 2014-11-05 NOTE — Patient Outreach (Signed)
[facility]) Care Management  11/05/2014  [name]. [birth year] [id number]   Telephone call to patient to explain and offer [place] Management services.  Patient refused [place] Management services at this time.  Plan:  RN Health coach will forward patient to Lurline Del to close patient due to refusal of services. RN Health Coach will notify patient primary MD of refusal of services.  RN Health Coach will send patient [place] Management contact information as requested.   Jone Baseman, RN, MSN [place] [phone]

## 2014-11-07 ENCOUNTER — Encounter: Payer: Self-pay | Admitting: Family Medicine

## 2014-11-07 DIAGNOSIS — Z9889 Other specified postprocedural states: Secondary | ICD-10-CM | POA: Insufficient documentation

## 2014-11-11 NOTE — Patient Outreach (Signed)
[facility]) Care Management  11/05/2014  Pranav Lince. 03-May-1931 [id number]   Notification from Jon Billings, RN to close case due to patient refused [place] Management services.  Thanks, Ronnell Freshwater. Jenkins, [place] Assistant Phone: [phone] Fax: [phone]

## 2014-11-22 ENCOUNTER — Other Ambulatory Visit: Payer: Self-pay | Admitting: Cardiovascular Disease

## 2014-11-22 DIAGNOSIS — I6523 Occlusion and stenosis of bilateral carotid arteries: Secondary | ICD-10-CM

## 2014-12-02 ENCOUNTER — Ambulatory Visit (INDEPENDENT_AMBULATORY_CARE_PROVIDER_SITE_OTHER): Payer: Commercial Managed Care - HMO

## 2014-12-02 DIAGNOSIS — I6523 Occlusion and stenosis of bilateral carotid arteries: Secondary | ICD-10-CM | POA: Diagnosis not present

## 2014-12-11 ENCOUNTER — Encounter: Payer: Self-pay | Admitting: Cardiovascular Disease

## 2014-12-11 ENCOUNTER — Ambulatory Visit (INDEPENDENT_AMBULATORY_CARE_PROVIDER_SITE_OTHER): Payer: Commercial Managed Care - HMO | Admitting: Cardiovascular Disease

## 2014-12-11 VITALS — BP 120/64 | HR 48 | Ht 72.0 in | Wt 166.0 lb

## 2014-12-11 DIAGNOSIS — I2510 Atherosclerotic heart disease of native coronary artery without angina pectoris: Secondary | ICD-10-CM

## 2014-12-11 DIAGNOSIS — I482 Chronic atrial fibrillation, unspecified: Secondary | ICD-10-CM

## 2014-12-11 DIAGNOSIS — I480 Paroxysmal atrial fibrillation: Secondary | ICD-10-CM | POA: Diagnosis not present

## 2014-12-11 DIAGNOSIS — E785 Hyperlipidemia, unspecified: Secondary | ICD-10-CM

## 2014-12-11 DIAGNOSIS — I25810 Atherosclerosis of coronary artery bypass graft(s) without angina pectoris: Secondary | ICD-10-CM | POA: Diagnosis not present

## 2014-12-11 DIAGNOSIS — R001 Bradycardia, unspecified: Secondary | ICD-10-CM | POA: Insufficient documentation

## 2014-12-11 MED ORDER — ROSUVASTATIN CALCIUM 20 MG PO TABS: 20.0000 mg | ORAL_TABLET | Freq: Every day | ORAL | Status: DC

## 2014-12-11 NOTE — Assessment & Plan Note (Signed)
Heart rate is slow after converting to normal sinus rhythm We'll decrease metoprolol as above

## 2014-12-11 NOTE — Assessment & Plan Note (Signed)
Encouraged him to stay on his Crestor 10 mg daily Goal LDL less than 70
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
## 2014-12-11 NOTE — Assessment & Plan Note (Signed)
Currently with no symptoms of angina. No further workup at this time. Continue current medication regimen. 

## 2014-12-11 NOTE — Patient Instructions (Addendum)
You are doing well.  Please cut the metoprolol in 1/2 twice a day (heart rate is slow) Continue crestor 10 mg daily  Please call us if you have new issues that need to be addressed before your next appt.  Your physician wants you to follow-up in: 6 months.  You will receive a reminder letter in the mail two months in advance. If you don't receive a letter, please call our office to schedule the follow-up appointment.

## 2014-12-11 NOTE — Assessment & Plan Note (Signed)
Was in atrial fibrillation March 2016, November 2015, now normal sinus rhythm He does not appreciate when he is having arrhythmia. Recommended he stay on his anticoagulation Heart rate is slow, recommended he decrease metoprolol back to 12.5 mg twice a day

## 2014-12-13 ENCOUNTER — Other Ambulatory Visit: Payer: Self-pay | Admitting: Urology

## 2014-12-13 ENCOUNTER — Ambulatory Visit (HOSPITAL_COMMUNITY)
Admission: RE | Admit: 2014-12-13 | Discharge: 2014-12-13 | Disposition: A | Discharge status: Home / Self Care | Payer: Commercial Managed Care - HMO | Source: Ambulatory Visit | Attending: Urology | Admitting: Urology

## 2014-12-13 DIAGNOSIS — C672 Malignant neoplasm of lateral wall of bladder: Secondary | ICD-10-CM

## 2014-12-13 DIAGNOSIS — N134 Hydroureter: Secondary | ICD-10-CM | POA: Diagnosis not present

## 2014-12-13 DIAGNOSIS — K449 Diaphragmatic hernia without obstruction or gangrene: Secondary | ICD-10-CM | POA: Diagnosis not present

## 2014-12-13 DIAGNOSIS — N1330 Unspecified hydronephrosis: Secondary | ICD-10-CM | POA: Diagnosis not present

## 2014-12-13 DIAGNOSIS — Z8551 Personal history of malignant neoplasm of bladder: Secondary | ICD-10-CM | POA: Diagnosis not present

## 2014-12-13 DIAGNOSIS — I10 Essential (primary) hypertension: Secondary | ICD-10-CM | POA: Insufficient documentation

## 2014-12-16 DIAGNOSIS — N183 Chronic kidney disease, stage 3 (moderate): Secondary | ICD-10-CM | POA: Diagnosis not present

## 2014-12-16 DIAGNOSIS — N1330 Unspecified hydronephrosis: Secondary | ICD-10-CM | POA: Diagnosis not present

## 2014-12-16 DIAGNOSIS — C672 Malignant neoplasm of lateral wall of bladder: Secondary | ICD-10-CM | POA: Diagnosis not present

## 2014-12-16 DIAGNOSIS — Z932 Ileostomy status: Secondary | ICD-10-CM | POA: Diagnosis not present

## 2015-01-03 DIAGNOSIS — C44519 Basal cell carcinoma of skin of other part of trunk: Secondary | ICD-10-CM | POA: Diagnosis not present

## 2015-01-03 DIAGNOSIS — C44310 Basal cell carcinoma of skin of unspecified parts of face: Secondary | ICD-10-CM | POA: Diagnosis not present

## 2015-01-03 DIAGNOSIS — C44319 Basal cell carcinoma of skin of other parts of face: Secondary | ICD-10-CM | POA: Diagnosis not present

## 2015-01-13 DIAGNOSIS — M79671 Pain in right foot: Secondary | ICD-10-CM | POA: Diagnosis not present

## 2015-01-13 DIAGNOSIS — M19071 Primary osteoarthritis, right ankle and foot: Secondary | ICD-10-CM | POA: Diagnosis not present

## 2015-01-13 DIAGNOSIS — M25571 Pain in right ankle and joints of right foot: Secondary | ICD-10-CM | POA: Diagnosis not present

## 2015-01-13 DIAGNOSIS — M1A09X1 Idiopathic chronic gout, multiple sites, with tophus (tophi): Secondary | ICD-10-CM | POA: Diagnosis not present

## 2015-01-13 DIAGNOSIS — M1009 Idiopathic gout, multiple sites: Secondary | ICD-10-CM | POA: Diagnosis not present

## 2015-02-05 DIAGNOSIS — Z932 Ileostomy status: Secondary | ICD-10-CM | POA: Diagnosis not present

## 2015-02-11 NOTE — Patient Outreach (Signed)
[name]) Care Management  02/11/2015  [name]. 06/14/1931 [id number]   Referral from [facility] tier 4 list, assigned to Mariann Laster, RN for patient outreach.  [name] L. Mansour Balboa, [place] Care Management Assistant

## 2015-02-18 ENCOUNTER — Other Ambulatory Visit: Payer: Self-pay

## 2015-02-18 NOTE — Patient Outreach (Signed)
[facility]) Care Management  02/18/2015  Linville Decarolis 1931-07-23 [id number]   Telephone Screen  Referral Date: 02/11/15 Referral Source: [facility] Tier 4 List Referral Reason: DM,CHF, 2 ED visits, 1 admit Primary MD: Dr. [name]  RN CM received voicemail message from patient. Return call placed to patient. Patient reached and discussed with patient [facility] services. He is familiar with program and it was recently offered to him in August. Patient states that he is doing well. His main concern is for his spouse who is currently in rehab. Patient appreciative of call but declined services at this time.     Plan: RN CM will notify [facility] administrative assistant that patient reached and declined services-case closed.  Enzo Montgomery, RN,BSN,CCM [place] Management Telephonic Care Management Coordinator Direct Phone: [phone] Toll Free: [phone] Fax: [phone]

## 2015-02-18 NOTE — Patient Outreach (Signed)
[facility]) Care Management  02/18/2015  Pelham Hennick 05-Mar-1932 [id number]   Telephone Screen  Referral Date: 02/11/15 Referral Source: [facility] Tier 4 List Referral Reason: DM,CHF, 2 ED visits, 1 admit Primary MD: Dr. [name]  Outreach attempt #1 to patient. Patient not reached. RN CM left HIPAA compliant voicemail message for patient along with contact info.   Plan: RN CM will make outreach attempt to patient within a week.  Enzo Montgomery, RN,BSN,CCM [place] Management Telephonic Care Management Coordinator Direct Phone: [phone] Toll Free: [phone] Fax: [phone]

## 2015-03-13 ENCOUNTER — Ambulatory Visit (INDEPENDENT_AMBULATORY_CARE_PROVIDER_SITE_OTHER): Payer: Commercial Managed Care - HMO | Admitting: *Deleted

## 2015-03-13 DIAGNOSIS — Z23 Encounter for immunization: Secondary | ICD-10-CM

## 2015-05-08 DIAGNOSIS — Z932 Ileostomy status: Secondary | ICD-10-CM | POA: Diagnosis not present

## 2015-06-17 ENCOUNTER — Other Ambulatory Visit: Payer: Self-pay | Admitting: Cardiovascular Disease

## 2015-06-17 DIAGNOSIS — I6523 Occlusion and stenosis of bilateral carotid arteries: Secondary | ICD-10-CM

## 2015-06-25 ENCOUNTER — Ambulatory Visit: Payer: Commercial Managed Care - HMO

## 2015-06-25 DIAGNOSIS — I6523 Occlusion and stenosis of bilateral carotid arteries: Secondary | ICD-10-CM

## 2015-07-11 ENCOUNTER — Ambulatory Visit (HOSPITAL_COMMUNITY)
Admission: RE | Admit: 2015-07-11 | Discharge: 2015-07-11 | Disposition: A | Discharge status: Home / Self Care | Payer: Commercial Managed Care - HMO | Source: Ambulatory Visit | Attending: Urology | Admitting: Urology

## 2015-07-11 ENCOUNTER — Other Ambulatory Visit: Payer: Self-pay | Admitting: Urology

## 2015-07-11 DIAGNOSIS — Z9889 Other specified postprocedural states: Secondary | ICD-10-CM | POA: Insufficient documentation

## 2015-07-11 DIAGNOSIS — C679 Malignant neoplasm of bladder, unspecified: Secondary | ICD-10-CM | POA: Diagnosis not present

## 2015-07-11 DIAGNOSIS — K449 Diaphragmatic hernia without obstruction or gangrene: Secondary | ICD-10-CM | POA: Insufficient documentation

## 2015-07-11 DIAGNOSIS — N1330 Unspecified hydronephrosis: Secondary | ICD-10-CM | POA: Diagnosis not present

## 2015-07-11 DIAGNOSIS — C672 Malignant neoplasm of lateral wall of bladder: Secondary | ICD-10-CM | POA: Diagnosis not present

## 2015-07-11 DIAGNOSIS — K8020 Calculus of gallbladder without cholecystitis without obstruction: Secondary | ICD-10-CM | POA: Diagnosis not present

## 2015-07-14 ENCOUNTER — Ambulatory Visit (INDEPENDENT_AMBULATORY_CARE_PROVIDER_SITE_OTHER): Payer: Commercial Managed Care - HMO | Admitting: Cardiovascular Disease

## 2015-07-14 ENCOUNTER — Encounter: Payer: Self-pay | Admitting: Cardiovascular Disease

## 2015-07-14 VITALS — BP 150/72 | HR 64 | Ht 72.0 in | Wt 169.5 lb

## 2015-07-14 DIAGNOSIS — I25810 Atherosclerosis of coronary artery bypass graft(s) without angina pectoris: Secondary | ICD-10-CM

## 2015-07-14 DIAGNOSIS — I739 Peripheral vascular disease, unspecified: Secondary | ICD-10-CM

## 2015-07-14 DIAGNOSIS — E785 Hyperlipidemia, unspecified: Secondary | ICD-10-CM | POA: Diagnosis not present

## 2015-07-14 DIAGNOSIS — I5032 Chronic diastolic (congestive) heart failure: Secondary | ICD-10-CM

## 2015-07-14 DIAGNOSIS — I10 Essential (primary) hypertension: Secondary | ICD-10-CM

## 2015-07-14 DIAGNOSIS — I480 Paroxysmal atrial fibrillation: Secondary | ICD-10-CM

## 2015-07-14 DIAGNOSIS — G459 Transient cerebral ischemic attack, unspecified: Secondary | ICD-10-CM

## 2015-07-14 NOTE — Assessment & Plan Note (Signed)
Long discussion today concerning his need for anticoagulation given paroxysmal atrial fibrillation. He stopped metoprolol on his own secondary to symptoms of fatigue. Was likely bradycardic on the beta blocker, suggested he could try one half dose of the metoprolol as tolerated. Would stay on anticoagulation   Total encounter time more than 25 minutes  Greater than 50% was spent in counseling and coordination of care with the patient

## 2015-07-14 NOTE — Assessment & Plan Note (Addendum)
Currently without anginal symptoms We have stressed the importance of aggressive cholesterol control He reports having previous issues with higher dose statins. As he is not having ischemic symptoms, no further testing at this time
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
## 2015-07-14 NOTE — Assessment & Plan Note (Signed)
Cholesterol is at goal on the current lipid regimen. No changes to the medications were made.  

## 2015-07-14 NOTE — Patient Instructions (Addendum)
You are doing well. No medication changes were made.  Consider 1/2 dose metoprolol once up to twice a day  Please call us if you have new issues that need to be addressed before your next appt.  Your physician wants you to follow-up in: 6 months.  You will receive a reminder letter in the mail two months in advance. If you don't receive a letter, please call our office to schedule the follow-up appointment.

## 2015-07-14 NOTE — Assessment & Plan Note (Deleted)
Long discussion concerning atrial fibrillation, recent subtherapeutic INR perhaps leading to TIA We did discuss newer agents, NOACs. Wife is unsure. Suggested she discuss this with Dr. Caryl Comes in their visit May 2017

## 2015-07-14 NOTE — Assessment & Plan Note (Signed)
He appears relatively euvolemic on today's visit.  No changes to his medication, recommended he closely monitor his blood pressure

## 2015-07-14 NOTE — Assessment & Plan Note (Signed)
Severe lower extremity arterial disease, currently without symptoms of claudication Stressed the importance of aggressive cholesterol control

## 2015-07-14 NOTE — Addendum Note (Signed)
Addended by: Minna Merritts on: 07/14/2015 01:57 PM   Modules accepted: Level of Service

## 2015-07-14 NOTE — Assessment & Plan Note (Signed)
Blood pressure is well controlled on today's visit. No changes made to the medications. 

## 2015-07-17 DIAGNOSIS — M1009 Idiopathic gout, multiple sites: Secondary | ICD-10-CM | POA: Diagnosis not present

## 2015-07-29 DIAGNOSIS — N1330 Unspecified hydronephrosis: Secondary | ICD-10-CM | POA: Diagnosis not present

## 2015-07-29 DIAGNOSIS — N183 Chronic kidney disease, stage 3 (moderate): Secondary | ICD-10-CM | POA: Diagnosis not present

## 2015-07-29 DIAGNOSIS — C672 Malignant neoplasm of lateral wall of bladder: Secondary | ICD-10-CM | POA: Diagnosis not present

## 2015-07-29 DIAGNOSIS — Z932 Ileostomy status: Secondary | ICD-10-CM | POA: Diagnosis not present

## 2015-08-05 DIAGNOSIS — Z932 Ileostomy status: Secondary | ICD-10-CM | POA: Diagnosis not present

## 2015-09-11 ENCOUNTER — Telehealth: Payer: Self-pay | Admitting: Family Medicine

## 2015-09-11 NOTE — Telephone Encounter (Signed)
Left message with pt spouse Inez Catalina to have pt call back and schedule cpe per [facility]

## 2015-09-15 IMAGING — CR DG CHEST 2V
2 series · 2 of 2 positions shown · non-contrast
Comparison: None.

CLINICAL DATA: Preop evaluation for bladder surgery

EXAM:
CHEST  2 VIEW

[w chest pa]
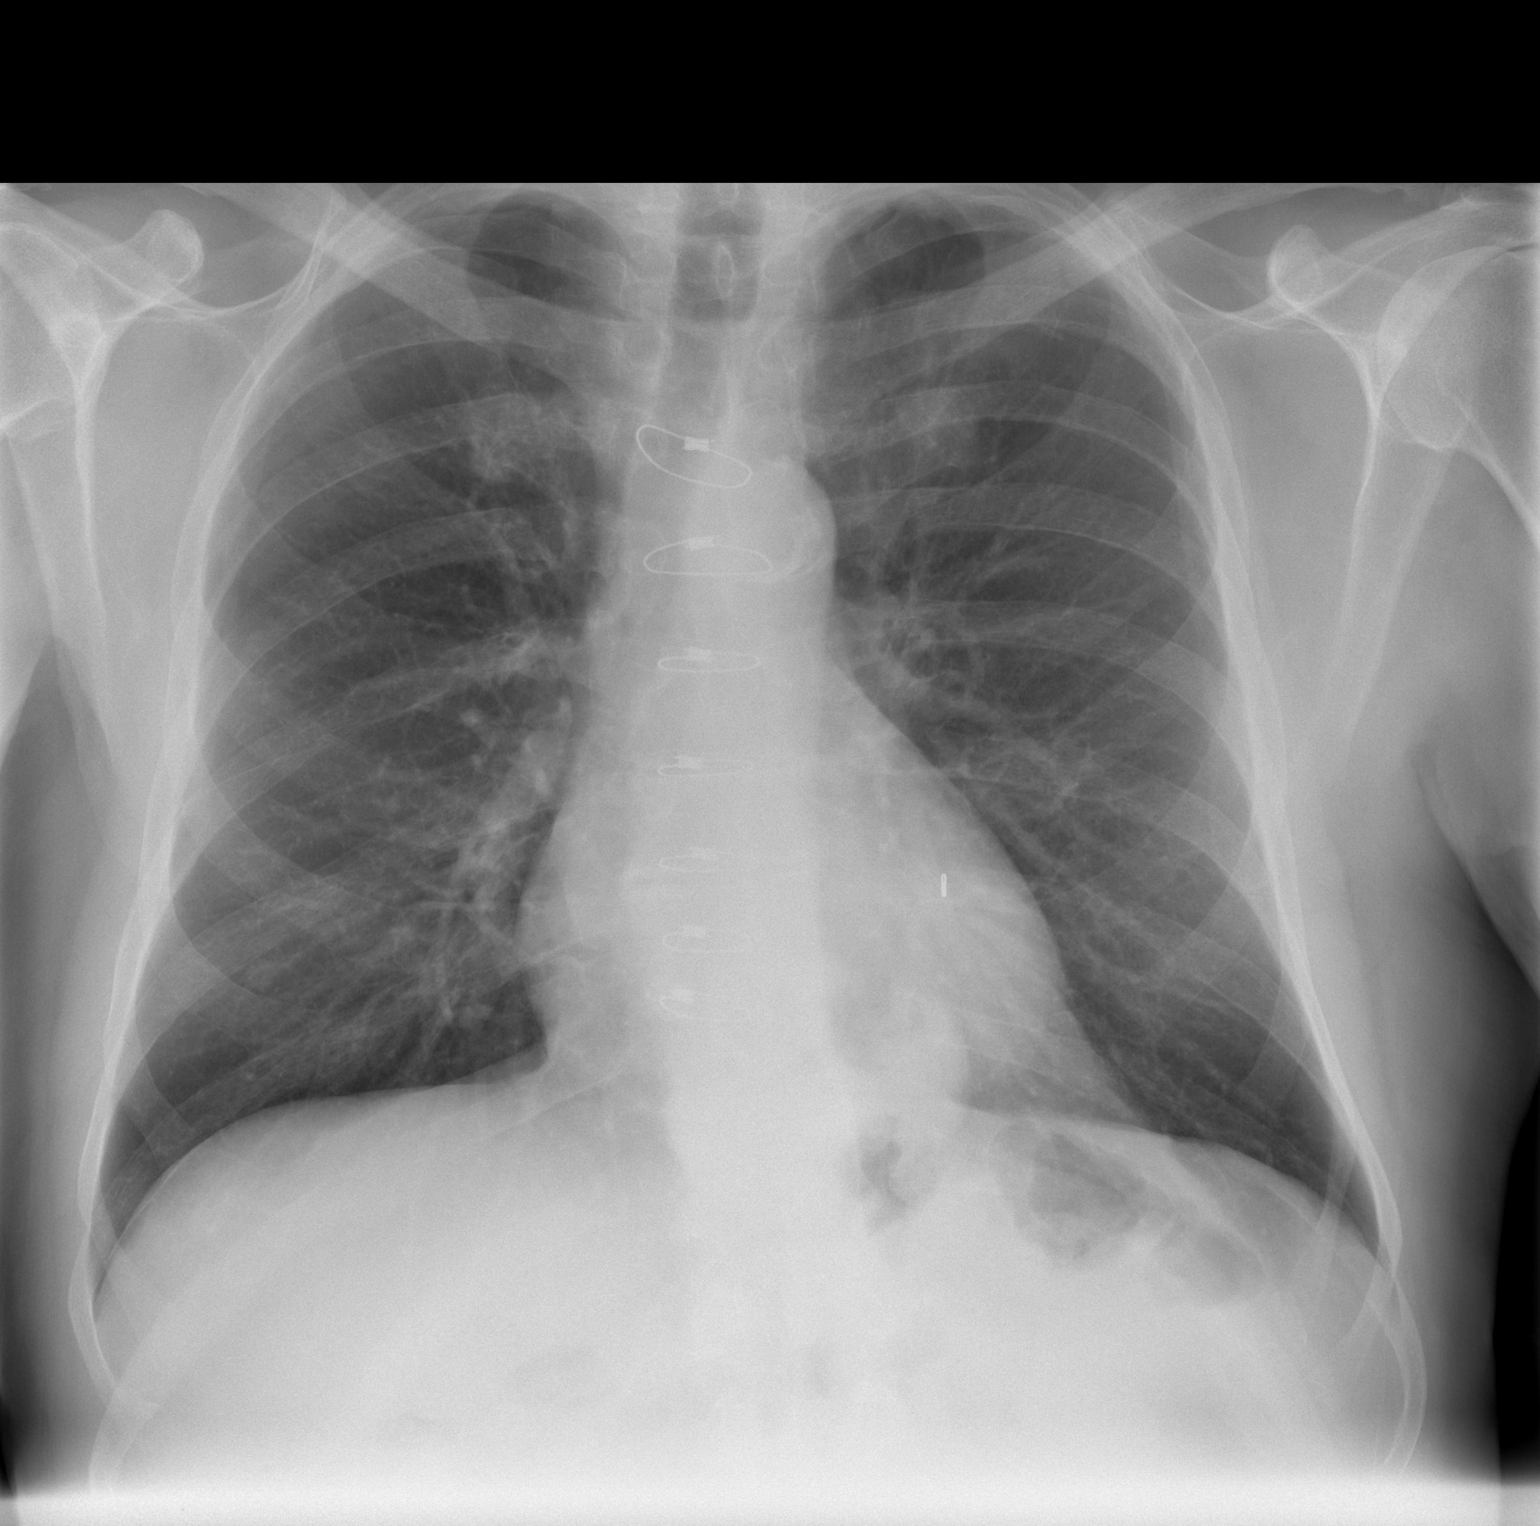

[w chest lat]
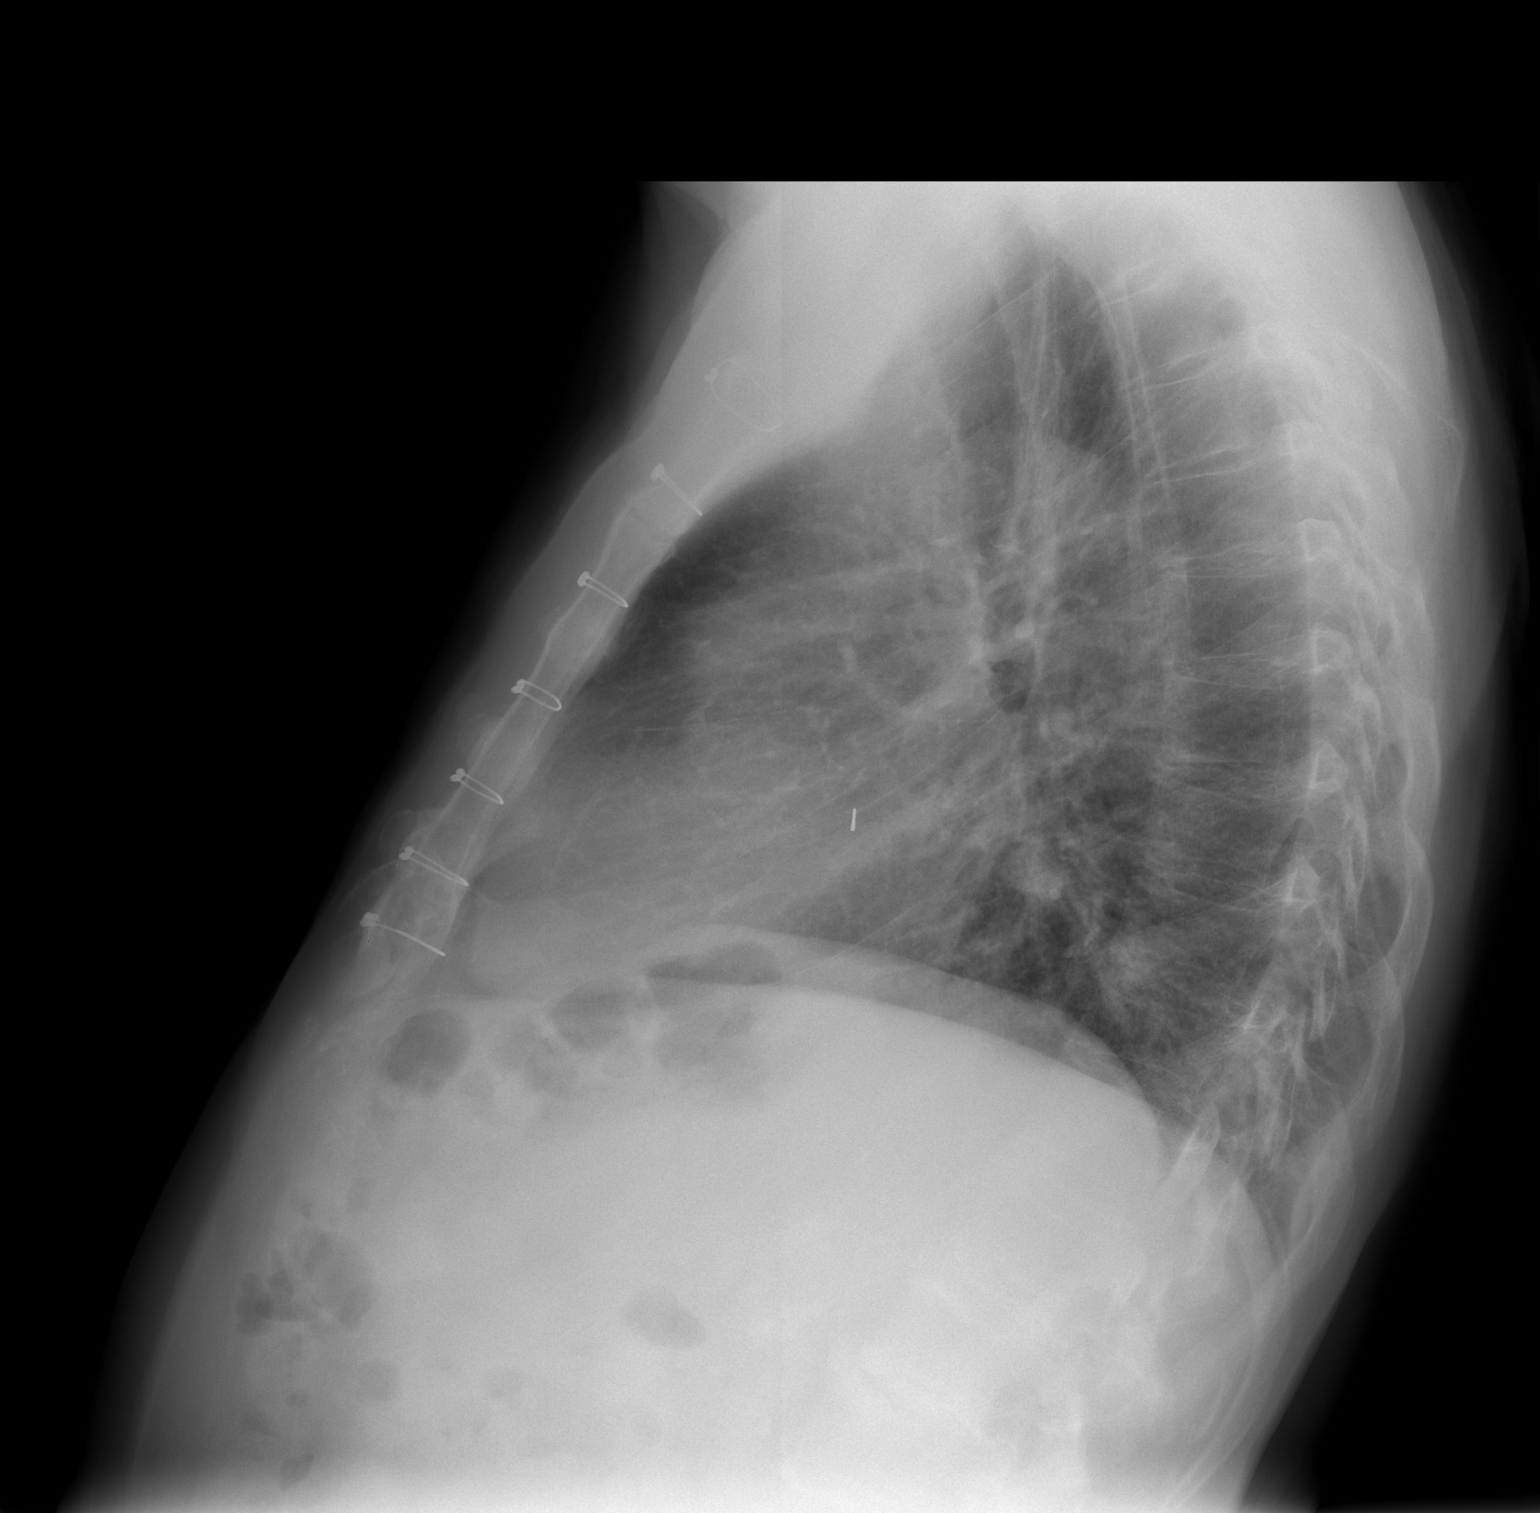

[2 of 2 positions shown; findings below may reference images not displayed]

FINDINGS: Cardiac shadow is within normal limits. Postsurgical changes are
noted. A hiatal hernia is seen. . focal infiltrate or sizable
effusion is seen. No acute bony abnormality is noted.
IMPRESSION: No active cardiopulmonary disease.

## 2015-09-18 ENCOUNTER — Other Ambulatory Visit: Payer: Self-pay | Admitting: Family Medicine

## 2015-09-18 DIAGNOSIS — I10 Essential (primary) hypertension: Secondary | ICD-10-CM

## 2015-09-19 ENCOUNTER — Other Ambulatory Visit: Payer: Self-pay | Admitting: Family Medicine

## 2015-09-23 ENCOUNTER — Other Ambulatory Visit (INDEPENDENT_AMBULATORY_CARE_PROVIDER_SITE_OTHER): Payer: Commercial Managed Care - HMO

## 2015-09-23 ENCOUNTER — Ambulatory Visit (INDEPENDENT_AMBULATORY_CARE_PROVIDER_SITE_OTHER): Payer: Commercial Managed Care - HMO

## 2015-09-23 VITALS — BP 150/83 | HR 99 | Temp 97.6°F | Ht 71.5 in | Wt 170.2 lb

## 2015-09-23 DIAGNOSIS — Z23 Encounter for immunization: Secondary | ICD-10-CM | POA: Diagnosis not present

## 2015-09-23 DIAGNOSIS — I10 Essential (primary) hypertension: Secondary | ICD-10-CM

## 2015-09-23 DIAGNOSIS — Z0000 Encounter for general adult medical examination without abnormal findings: Secondary | ICD-10-CM | POA: Diagnosis not present

## 2015-09-23 LAB — COMPREHENSIVE METABOLIC PANEL
ALBUMIN: 4.4 g/dL (ref 3.5–5.2)
ALK PHOS: 59 U/L (ref 39–117)
ALT: 6 U/L (ref 0–53)
AST: 15 U/L (ref 0–37)
BILIRUBIN TOTAL: 0.4 mg/dL (ref 0.2–1.2)
BUN: 35 mg/dL — ABNORMAL HIGH (ref 6–23)
CALCIUM: 10.1 mg/dL (ref 8.4–10.5)
CO2: 28 mEq/L (ref 19–32)
CREATININE: 2.27 mg/dL — AB (ref 0.40–1.50)
Chloride: 103 mEq/L (ref 96–112)
GFR: 29.5 mL/min — AB (ref >60.00)
Glucose, Bld: 102 mg/dL — ABNORMAL HIGH (ref 70–99)
Potassium: 5.1 mEq/L (ref 3.5–5.1)
Sodium: 138 mEq/L (ref 135–145)
TOTAL PROTEIN: 8.1 g/dL (ref 6.0–8.3)

## 2015-09-23 LAB — CBC WITH DIFFERENTIAL/PLATELET
BASOS ABS: 0.1 10*3/uL (ref 0.0–0.1)
BASOS PCT: 0.9 % (ref 0.0–3.0)
EOS PCT: 6.7 % — AB (ref 0.0–5.0)
Eosinophils Absolute: 0.6 10*3/uL (ref 0.0–0.7)
HEMATOCRIT: 28.9 % — AB (ref 39.0–52.0)
Hemoglobin: 8.7 g/dL — ABNORMAL LOW (ref 13.0–17.0)
LYMPHS PCT: 22.3 % (ref 12.0–46.0)
Lymphs Abs: 1.8 10*3/uL (ref 0.7–4.0)
MCHC: 30.2 g/dL (ref 30.0–36.0)
MONOS PCT: 8.2 % (ref 3.0–12.0)
Monocytes Absolute: 0.7 10*3/uL (ref 0.1–1.0)
NEUTROS ABS: 5.1 10*3/uL (ref 1.4–7.7)
Neutrophils Relative %: 61.9 % (ref 43.0–77.0)
PLATELETS: 431 10*3/uL — AB (ref 150.0–400.0)
RBC: 4.2 Mil/uL — ABNORMAL LOW (ref 4.22–5.81)
RDW: 17.7 % — AB (ref 11.5–15.5)
WBC: 8.3 10*3/uL (ref 4.0–10.5)

## 2015-09-23 LAB — LIPID PANEL
CHOLESTEROL: 140 mg/dL (ref 0–200)
HDL: 59.4 mg/dL (ref >39.00)
LDL Cholesterol: 53 mg/dL (ref 0–99)
NonHDL: 80.57
TRIGLYCERIDES: 139 mg/dL (ref 0.0–149.0)
Total CHOL/HDL Ratio: 2
VLDL: 27.8 mg/dL (ref 0.0–40.0)

## 2015-09-23 LAB — TSH: TSH: 3.83 u[IU]/mL (ref 0.35–4.50)

## 2015-09-23 NOTE — Progress Notes (Signed)
Pre visit review using our clinic review tool, if applicable. No additional management support is needed unless otherwise documented below in the visit note. 
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
## 2015-09-23 NOTE — Progress Notes (Signed)
PCP notes:  Health maintenance:  PCV13 - administered  Abnormal screenings:  Hearing - failed  Patient concerns: None  Nurse concerns: Pt's BP was 150/88. Pt had not taken morning BP meds. PCP notified.   Next PCP appt: 11/28/15 @ 0945

## 2015-09-23 NOTE — Progress Notes (Signed)
Noted. BP stable especially given that he's not yet had antihypertensives. Patient asymptomatic.  I reviewed health advisor's note, was available for consultation, and agree with documentation and plan.

## 2015-09-23 NOTE — Patient Instructions (Signed)
[name] , Thank you for taking time to come for your Medicare Wellness Visit. I appreciate your ongoing commitment to your health goals. Please review the following plan we discussed and let me know if I can assist you in the future.   These are the goals we discussed: Goals    . Increase physical activity     Starting 09/23/15, I will continue to walk 1/4 mile daily as weather permits.        This is a list of the screening recommended for you and due dates:  Health Maintenance  Topic Date Due  . Flu Shot  11/04/2015  . Tetanus Vaccine  01/21/2020  . Shingles Vaccine  Completed  . Pneumonia vaccines  Completed    Preventive Care for Adults  A healthy lifestyle and preventive care can promote health and wellness. Preventive health guidelines for adults include the following key practices.  . A routine yearly physical is a good way to check with your health care provider about your health and preventive screening. It is a chance to share any concerns and updates on your health and to receive a thorough exam.  . Visit your dentist for a routine exam and preventive care every 6 months. Brush your teeth twice a day and floss once a day. Good oral hygiene prevents tooth decay and gum disease.  . The frequency of eye exams is based on your age, health, family medical history, use  of contact lenses, and other factors. Follow your health care provider's ecommendations for frequency of eye exams.  . Eat a healthy diet. Foods like vegetables, fruits, whole grains, low-fat dairy products, and lean protein foods contain the nutrients you need without too many calories. Decrease your intake of foods high in solid fats, added sugars, and salt. Eat the right amount of calories for you. Get information about a proper diet from your health care provider, if necessary.  . Regular physical exercise is one of the most important things you can do for your health. Most adults should get at least 150  minutes of moderate-intensity exercise (any activity that increases your heart rate and causes you to sweat) each week. In addition, most adults need muscle-strengthening exercises on 2 or more days a week.  Silver Sneakers may be a benefit available to you. To determine eligibility, you may visit the website: www.silversneakers.com or contact program at 248-868-8111 Mon-Fri between 8AM-8PM.   . Maintain a healthy weight. The body mass index (BMI) is a screening tool to identify possible weight problems. It provides an estimate of body fat based on height and weight. Your health care provider can find your BMI and can help you achieve or maintain a healthy weight.   For adults 20 years and older: ? A BMI below 18.5 is considered underweight. ? A BMI of 18.5 to 24.9 is normal. ? A BMI of 25 to 29.9 is considered overweight. ? A BMI of 30 and above is considered obese.   . Maintain normal blood lipids and cholesterol levels by exercising and minimizing your intake of saturated fat. Eat a balanced diet with plenty of fruit and vegetables. Blood tests for lipids and cholesterol should begin at age 60 and be repeated every 5 years. If your lipid or cholesterol levels are high, you are over 50, or you are at high risk for heart disease, you may need your cholesterol levels checked more frequently. Ongoing high lipid and cholesterol levels should be treated with medicines if diet  and exercise are not working.  . If you smoke, find out from your health care provider how to quit. If you do not use tobacco, please do not start.  . If you choose to drink alcohol, please do not consume more than 2 drinks per day. One drink is considered to be 12 ounces (355 mL) of beer, 5 ounces (148 mL) of wine, or 1.5 ounces (44 mL) of liquor.  . If you are 76-81 years old, ask your health care provider if you should take aspirin to prevent strokes.  . Use sunscreen. Apply sunscreen liberally and repeatedly throughout  the day. You should seek shade when your shadow is shorter than you. Protect yourself by wearing long sleeves, pants, a wide-brimmed hat, and sunglasses year round, whenever you are outdoors.  . Once a month, do a whole body skin exam, using a mirror to look at the skin on your back. Tell your health care provider of new moles, moles that have irregular borders, moles that are larger than a pencil eraser, or moles that have changed in shape or color.

## 2015-09-26 ENCOUNTER — Encounter: Payer: Self-pay | Admitting: Family Medicine

## 2015-09-26 LAB — CREATININE, SERUM: CREATININE: 2.24

## 2015-09-28 ENCOUNTER — Other Ambulatory Visit: Payer: Self-pay | Admitting: Family Medicine

## 2015-09-28 ENCOUNTER — Encounter: Payer: Self-pay | Admitting: Family Medicine

## 2015-09-28 DIAGNOSIS — D649 Anemia, unspecified: Secondary | ICD-10-CM

## 2015-10-06 ENCOUNTER — Other Ambulatory Visit (INDEPENDENT_AMBULATORY_CARE_PROVIDER_SITE_OTHER): Payer: Commercial Managed Care - HMO

## 2015-10-06 DIAGNOSIS — D649 Anemia, unspecified: Secondary | ICD-10-CM

## 2015-10-06 LAB — CBC WITH DIFFERENTIAL/PLATELET
BASOS PCT: 0.6 % (ref 0.0–3.0)
Basophils Absolute: 0 10*3/uL (ref 0.0–0.1)
EOS PCT: 6.5 % — AB (ref 0.0–5.0)
Eosinophils Absolute: 0.5 10*3/uL (ref 0.0–0.7)
HCT: 28 % — ABNORMAL LOW (ref 39.0–52.0)
Hemoglobin: 8.4 g/dL — ABNORMAL LOW (ref 13.0–17.0)
LYMPHS ABS: 1.5 10*3/uL (ref 0.7–4.0)
Lymphocytes Relative: 21 % (ref 12.0–46.0)
MCHC: 30.1 g/dL (ref 30.0–36.0)
MONO ABS: 0.5 10*3/uL (ref 0.1–1.0)
MONOS PCT: 7.6 % (ref 3.0–12.0)
NEUTROS ABS: 4.4 10*3/uL (ref 1.4–7.7)
NEUTROS PCT: 64.3 % (ref 43.0–77.0)
PLATELETS: 340 10*3/uL (ref 150.0–400.0)
RBC: 4.11 Mil/uL — ABNORMAL LOW (ref 4.22–5.81)
RDW: 18.1 % — AB (ref 11.5–15.5)
WBC: 6.9 10*3/uL (ref 4.0–10.5)

## 2015-10-06 LAB — IBC PANEL
Iron: 19 ug/dL — ABNORMAL LOW (ref 42–165)
Saturation Ratios: 3.1 % — ABNORMAL LOW (ref 20.0–50.0)
Transferrin: 437 mg/dL — ABNORMAL HIGH (ref 212.0–360.0)

## 2015-10-06 LAB — FOLATE: Folate: >23.7 ng/mL (ref >5.9)

## 2015-10-06 LAB — VITAMIN B12: VITAMIN B 12: 365 pg/mL (ref 211–911)

## 2015-10-09 ENCOUNTER — Telehealth: Payer: Self-pay | Admitting: Family Medicine

## 2015-10-09 ENCOUNTER — Other Ambulatory Visit: Payer: Self-pay | Admitting: Family Medicine

## 2015-10-09 ENCOUNTER — Other Ambulatory Visit: Payer: Self-pay | Admitting: *Deleted

## 2015-10-09 DIAGNOSIS — D509 Iron deficiency anemia, unspecified: Secondary | ICD-10-CM

## 2015-10-09 MED ORDER — FERROUS SULFATE 325 (65 FE) MG PO TABS: 325.0000 mg | ORAL_TABLET | Freq: Every day | ORAL | Status: DC

## 2015-10-09 NOTE — Telephone Encounter (Signed)
Pt returned your call Best number [phone]

## 2015-10-09 NOTE — Telephone Encounter (Signed)
Returned call

## 2015-10-16 ENCOUNTER — Other Ambulatory Visit (INDEPENDENT_AMBULATORY_CARE_PROVIDER_SITE_OTHER): Payer: Commercial Managed Care - HMO

## 2015-10-16 ENCOUNTER — Other Ambulatory Visit: Payer: Self-pay | Admitting: Family Medicine

## 2015-10-16 DIAGNOSIS — D509 Iron deficiency anemia, unspecified: Secondary | ICD-10-CM

## 2015-10-16 DIAGNOSIS — R195 Other fecal abnormalities: Secondary | ICD-10-CM

## 2015-10-16 LAB — FECAL OCCULT BLOOD, IMMUNOCHEMICAL: FECAL OCCULT BLD: POSITIVE — AB

## 2015-10-17 ENCOUNTER — Other Ambulatory Visit: Payer: Self-pay | Admitting: Family Medicine

## 2015-10-17 DIAGNOSIS — D649 Anemia, unspecified: Secondary | ICD-10-CM

## 2015-10-24 ENCOUNTER — Other Ambulatory Visit (INDEPENDENT_AMBULATORY_CARE_PROVIDER_SITE_OTHER): Payer: Commercial Managed Care - HMO

## 2015-10-24 DIAGNOSIS — D649 Anemia, unspecified: Secondary | ICD-10-CM

## 2015-10-24 LAB — CBC WITH DIFFERENTIAL/PLATELET
BASOS ABS: 0 10*3/uL (ref 0.0–0.1)
Basophils Relative: 0.5 % (ref 0.0–3.0)
EOS PCT: 5.2 % — AB (ref 0.0–5.0)
Eosinophils Absolute: 0.4 10*3/uL (ref 0.0–0.7)
HEMATOCRIT: 32 % — AB (ref 39.0–52.0)
Hemoglobin: 9.6 g/dL — ABNORMAL LOW (ref 13.0–17.0)
LYMPHS ABS: 1.7 10*3/uL (ref 0.7–4.0)
LYMPHS PCT: 20.3 % (ref 12.0–46.0)
MCHC: 30.1 g/dL (ref 30.0–36.0)
MCV: 71.3 fl — AB (ref 78.0–100.0)
MONOS PCT: 5.6 % (ref 3.0–12.0)
Monocytes Absolute: 0.5 10*3/uL (ref 0.1–1.0)
NEUTROS ABS: 5.7 10*3/uL (ref 1.4–7.7)
NEUTROS PCT: 68.4 % (ref 43.0–77.0)
PLATELETS: 347 10*3/uL (ref 150.0–400.0)
RBC: 4.49 Mil/uL (ref 4.22–5.81)
RDW: 22.9 % — ABNORMAL HIGH (ref 11.5–15.5)
WBC: 8.3 10*3/uL (ref 4.0–10.5)

## 2015-11-04 ENCOUNTER — Ambulatory Visit (INDEPENDENT_AMBULATORY_CARE_PROVIDER_SITE_OTHER): Payer: Commercial Managed Care - HMO | Admitting: Gastroenterology

## 2015-11-04 ENCOUNTER — Encounter: Payer: Self-pay | Admitting: Gastroenterology

## 2015-11-04 VITALS — BP 150/84 | HR 82 | Ht 71.5 in | Wt 170.1 lb

## 2015-11-04 DIAGNOSIS — R195 Other fecal abnormalities: Secondary | ICD-10-CM | POA: Diagnosis not present

## 2015-11-04 DIAGNOSIS — D509 Iron deficiency anemia, unspecified: Secondary | ICD-10-CM

## 2015-11-04 NOTE — Patient Instructions (Signed)
We made you an appointment with Dr. Silvano Rusk for 01-06-2016.  Come to our lab on Monday 10-2 to have blood drawn.
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
## 2015-11-05 DIAGNOSIS — Z932 Ileostomy status: Secondary | ICD-10-CM | POA: Diagnosis not present

## 2015-11-05 DIAGNOSIS — D509 Iron deficiency anemia, unspecified: Secondary | ICD-10-CM | POA: Insufficient documentation

## 2015-11-06 DIAGNOSIS — M19071 Primary osteoarthritis, right ankle and foot: Secondary | ICD-10-CM | POA: Diagnosis not present

## 2015-11-06 DIAGNOSIS — M1009 Idiopathic gout, multiple sites: Secondary | ICD-10-CM | POA: Diagnosis not present

## 2015-11-06 DIAGNOSIS — M1A09X1 Idiopathic chronic gout, multiple sites, with tophus (tophi): Secondary | ICD-10-CM | POA: Diagnosis not present

## 2015-11-06 DIAGNOSIS — M79671 Pain in right foot: Secondary | ICD-10-CM | POA: Diagnosis not present

## 2015-11-15 NOTE — Progress Notes (Signed)
Patient has opted for treatment and observation for now. Will see how he is at next visit.  Agree with Ms. Alphia Kava management.  Gatha Mayer, MD, [facility]

## 2015-11-27 ENCOUNTER — Other Ambulatory Visit: Payer: Self-pay | Admitting: *Deleted

## 2015-11-27 MED ORDER — FERROUS SULFATE 325 (65 FE) MG PO TABS: 325.0000 mg | ORAL_TABLET | Freq: Every day | ORAL | 3 refills | Status: DC

## 2015-11-28 ENCOUNTER — Encounter: Payer: Self-pay | Admitting: Family Medicine

## 2015-11-28 ENCOUNTER — Ambulatory Visit (INDEPENDENT_AMBULATORY_CARE_PROVIDER_SITE_OTHER): Payer: Commercial Managed Care - HMO | Admitting: Family Medicine

## 2015-11-28 ENCOUNTER — Other Ambulatory Visit: Payer: Self-pay | Admitting: *Deleted

## 2015-11-28 VITALS — BP 120/84 | HR 65 | Temp 98.0°F | Ht 72.0 in | Wt 169.5 lb

## 2015-11-28 DIAGNOSIS — N183 Chronic kidney disease, stage 3 unspecified: Secondary | ICD-10-CM

## 2015-11-28 DIAGNOSIS — C679 Malignant neoplasm of bladder, unspecified: Secondary | ICD-10-CM

## 2015-11-28 DIAGNOSIS — Z23 Encounter for immunization: Secondary | ICD-10-CM

## 2015-11-28 DIAGNOSIS — Z936 Other artificial openings of urinary tract status: Secondary | ICD-10-CM

## 2015-11-28 DIAGNOSIS — Z0000 Encounter for general adult medical examination without abnormal findings: Secondary | ICD-10-CM

## 2015-11-28 DIAGNOSIS — E785 Hyperlipidemia, unspecified: Secondary | ICD-10-CM

## 2015-11-28 DIAGNOSIS — D509 Iron deficiency anemia, unspecified: Secondary | ICD-10-CM

## 2015-11-28 DIAGNOSIS — R748 Abnormal levels of other serum enzymes: Secondary | ICD-10-CM

## 2015-11-28 DIAGNOSIS — R7989 Other specified abnormal findings of blood chemistry: Secondary | ICD-10-CM

## 2015-11-28 LAB — BASIC METABOLIC PANEL
BUN: 30 mg/dL — AB (ref 6–23)
CHLORIDE: 99 meq/L (ref 96–112)
CO2: 26 meq/L (ref 19–32)
CREATININE: 2.16 mg/dL — AB (ref 0.40–1.50)
Calcium: 9.7 mg/dL (ref 8.4–10.5)
GFR: 31.22 mL/min — ABNORMAL LOW (ref >60.00)
GLUCOSE: 93 mg/dL (ref 70–99)
Potassium: 5.1 mEq/L (ref 3.5–5.1)
Sodium: 134 mEq/L — ABNORMAL LOW (ref 135–145)

## 2015-11-28 MED ORDER — OMEPRAZOLE 40 MG PO CPDR: DELAYED_RELEASE_CAPSULE | ORAL | 3 refills | Status: DC

## 2015-11-28 NOTE — Progress Notes (Signed)
I have personally reviewed the Medicare Annual Wellness questionnaire and have noted 1. The patient's medical and social history 2. Their use of alcohol, tobacco or illicit drugs 3. Their current medications and supplements 4. The patient's functional ability including ADL's, fall risks, home safety risks and hearing or visual             impairment. 5. Diet and physical activities 6. Evidence for depression or mood disorders  The patients weight, height, BMI have been recorded in the chart and visual acuity is per eye clinic.  I have made referrals, counseling and provided education to the patient based review of the above and I have provided the pt with a written personalized care plan for preventive services.  Provider list updated- see scanned forms.  Routine anticipatory guidance given to patient.  See health maintenance.  Flu 2017 Shingles UTD PNA UTD Tetanus UTD Colonoscopy 2011 Prostate cancer screening per urology, d/w pt.  Advance directive- either son [name] or [name] designated if patient were incapacitated.   Cognitive function addressed- see scanned forms- and if abnormal then additional documentation follows.   Plans to see uro again in 2018.   Urostomy.  No skin breakdown.  No new complaints.  No abd pain. No blood in urine seen by patient.   Elevated Cr d/w pt.  Due for repeat labs.  No nsaids.    Anemia.  CBC prev improved on iron, has f/u with GI pending for fall.  No need to recheck CBC at this point given the prev improvement. No known bleeding.  He agrees with plan.   Elevated Cholesterol: Using medications without problems:yes Muscle aches: no Diet compliance:yes Exercise:limited  Lipids prev at goal.   PMH and SH reviewed  Meds, vitals, and allergies reviewed.   ROS: Per HPI.  Unless specifically indicated otherwise in HPI, the patient denies:  General: fever. Eyes: acute vision changes ENT: sore throat Cardiovascular: chest pain Respiratory:  SOB GI: vomiting GU: dysuria Musculoskeletal: acute back pain Derm: acute rash Neuro: acute motor dysfunction Psych: worsening mood Endocrine: polydipsia Heme: bleeding Allergy: hayfever  GEN: nad, alert and oriented HEENT: mucous membranes moist NECK: supple w/o LA CV: rrr. PULM: ctab, no inc wob ABD: soft, +bs EXT: no edema SKIN: no acute rash Urostomy noted, intact.  Clean appearing.

## 2015-11-28 NOTE — Progress Notes (Signed)
Pre visit review using our clinic review tool, if applicable. No additional management support is needed unless otherwise documented below in the visit note. 

## 2015-11-28 NOTE — Patient Instructions (Signed)
Go to the lab on the way out.  We'll contact you with your lab report. Don't change your meds for now.  Take care.  Glad to see you.  

## 2015-11-30 ENCOUNTER — Encounter: Payer: Self-pay | Admitting: Family Medicine

## 2015-11-30 DIAGNOSIS — Z0000 Encounter for general adult medical examination without abnormal findings: Secondary | ICD-10-CM | POA: Insufficient documentation

## 2015-11-30 DIAGNOSIS — Z7189 Other specified counseling: Secondary | ICD-10-CM | POA: Insufficient documentation

## 2015-11-30 NOTE — Assessment & Plan Note (Signed)
Per urology.  He agrees.

## 2015-11-30 NOTE — Assessment & Plan Note (Signed)
CBC prev improved on iron, has f/u with GI pending for fall.  No need to recheck CBC at this point given the prev improvement. No known bleeding.  He agrees with plan.

## 2015-11-30 NOTE — Assessment & Plan Note (Signed)
Controlled, continue statin.

## 2015-11-30 NOTE — Assessment & Plan Note (Signed)
Elevated Cr d/w pt.  Due for repeat labs.  No nsaids. See notes on labs.

## 2015-11-30 NOTE — Assessment & Plan Note (Signed)
Flu 2017 Shingles UTD PNA UTD Tetanus UTD Colonoscopy 2011 Prostate cancer screening per urology, d/w pt.  Advance directive- either son Wesam or Iona Beard designated if patient were incapacitated.   Cognitive function addressed- see scanned forms- and if abnormal then additional documentation follows.  He is still caring for his wife at home, support offered.

## 2015-11-30 NOTE — Assessment & Plan Note (Signed)
Plans to see uro again in 2018.   Urostomy.  No skin breakdown.  No new complaints.  No abd pain. No blood in urine seen by patient.

## 2015-12-01 ENCOUNTER — Ambulatory Visit: Payer: Commercial Managed Care - HMO | Admitting: Internal Medicine

## 2016-01-05 ENCOUNTER — Other Ambulatory Visit (INDEPENDENT_AMBULATORY_CARE_PROVIDER_SITE_OTHER): Payer: Commercial Managed Care - HMO

## 2016-01-05 DIAGNOSIS — R195 Other fecal abnormalities: Secondary | ICD-10-CM | POA: Diagnosis not present

## 2016-01-05 DIAGNOSIS — D509 Iron deficiency anemia, unspecified: Secondary | ICD-10-CM

## 2016-01-05 LAB — CBC WITH DIFFERENTIAL/PLATELET
BASOS ABS: 0 10*3/uL (ref 0.0–0.1)
Basophils Relative: 0.5 % (ref 0.0–3.0)
EOS PCT: 5.1 % — AB (ref 0.0–5.0)
Eosinophils Absolute: 0.4 10*3/uL (ref 0.0–0.7)
HEMATOCRIT: 43.5 % (ref 39.0–52.0)
Hemoglobin: 14.2 g/dL (ref 13.0–17.0)
LYMPHS ABS: 1.5 10*3/uL (ref 0.7–4.0)
LYMPHS PCT: 18.6 % (ref 12.0–46.0)
MCHC: 32.6 g/dL (ref 30.0–36.0)
MCV: 86.6 fl (ref 78.0–100.0)
MONOS PCT: 7.2 % (ref 3.0–12.0)
Monocytes Absolute: 0.6 10*3/uL (ref 0.1–1.0)
NEUTROS ABS: 5.7 10*3/uL (ref 1.4–7.7)
NEUTROS PCT: 68.6 % (ref 43.0–77.0)
Platelets: 269 10*3/uL (ref 150.0–400.0)
RBC: 5.03 Mil/uL (ref 4.22–5.81)
RDW: 25.1 % — ABNORMAL HIGH (ref 11.5–15.5)
WBC: 8.3 10*3/uL (ref 4.0–10.5)

## 2016-01-05 LAB — FERRITIN: Ferritin: 31.3 ng/mL (ref 22.0–322.0)

## 2016-01-05 LAB — IBC PANEL
Iron: 57 ug/dL (ref 42–165)
SATURATION RATIOS: 13.9 % — AB (ref 20.0–50.0)
Transferrin: 292 mg/dL (ref 212.0–360.0)

## 2016-01-06 ENCOUNTER — Encounter: Payer: Self-pay | Admitting: Internal Medicine

## 2016-01-06 ENCOUNTER — Encounter (INDEPENDENT_AMBULATORY_CARE_PROVIDER_SITE_OTHER): Payer: Self-pay

## 2016-01-06 ENCOUNTER — Ambulatory Visit (INDEPENDENT_AMBULATORY_CARE_PROVIDER_SITE_OTHER): Payer: Commercial Managed Care - HMO | Admitting: Internal Medicine

## 2016-01-06 VITALS — BP 164/100 | HR 88 | Ht 70.5 in | Wt 172.2 lb

## 2016-01-06 DIAGNOSIS — R195 Other fecal abnormalities: Secondary | ICD-10-CM | POA: Diagnosis not present

## 2016-01-06 DIAGNOSIS — I499 Cardiac arrhythmia, unspecified: Secondary | ICD-10-CM | POA: Diagnosis not present

## 2016-01-06 DIAGNOSIS — Z8679 Personal history of other diseases of the circulatory system: Secondary | ICD-10-CM | POA: Diagnosis not present

## 2016-01-06 DIAGNOSIS — D509 Iron deficiency anemia, unspecified: Secondary | ICD-10-CM | POA: Diagnosis not present

## 2016-01-06 NOTE — Progress Notes (Signed)
   [name]. 82 y.o. 19-Dec-1931 [id number]  Assessment & Plan:   1. Heme + stool   2. Iron deficiency anemia, unspecified iron deficiency anemia type   3. Irregular heart rhythm   4. History of atrial fibrillation    Iron deficiency is treated. Normal hemoglobin and normal ferritin now. Causes not clear but seems very likely to a been blood loss related to his bladder cancer going over the history. Heme positive stool could've been from hemorrhoids. He should have a colonoscopy based upon standards of care. He does not want to do this. He understands the possibility of missing a colon cancer or something serious. He will think about it and let me know if he wants to pursue a colonoscopy.  In the meantime he will continue his iron supplementation and follow-up with Dr. Damita Dunnings.  His heart rate 70 like he was in atrial fibrillation without a rapid rate today. He will contact Dr. Rockey Situ.  I appreciate the opportunity to care for this patient. CC: Kenneth Stain, MD [name] M.D.  Subjective:   Chief Complaint:Iron deficiency anemia heme positive stool  HPI Patient is here for follow-up. He saw Alonza Bogus PA see for heme positive stool and iron deficiency anemia. He elected iron treatment and observation. He did not want to pursue a colonoscopy. Since that time his hemoglobin is returned normal and his ferritin is normal now. He feels well overall. He is tired of procedures etc. he had bladder cancer resected he had a prostatectomy and he has an ileal diversion loop to drain his urine. He feels lucky to be alive cancer free overall but would prefer not any more testing.  Medications, allergies, past medical history, past surgical history, family history and social history are reviewed and updated in the EMR.  Review of Systems As above  Objective:   Physical Exam BP (!) 164/100 (BP Location: Left Arm, Patient Position: Sitting, Cuff Size: Normal)   Pulse 88 Comment:  irregular  Ht 5' 10.5" (1.791 m) Comment: height measured without shoes  Wt 172 lb 4 oz (78.1 kg)   BMI 24.37 kg/m  No acute distress Heart rhythm is irregular though murmurs are heard Eyes anicteric Lab Results  Component Value Date   WBC 8.3 01/05/2016   HGB 14.2 01/05/2016   HCT 43.5 01/05/2016   MCV 86.6 01/05/2016   PLT 269.0 01/05/2016   Lab Results  Component Value Date   FERRITIN 31.3 01/05/2016

## 2016-01-06 NOTE — Patient Instructions (Signed)
   Please contact Dr. Rockey Situ about the irregular heartbeat - A fib may be back.  Technically you should have a colonoscopy - let me know if you change your mind and will schedule one.  I appreciate the opportunity to care for you. Gatha Mayer, MD, [facility]

## 2016-02-05 ENCOUNTER — Telehealth: Payer: Self-pay

## 2016-02-05 NOTE — Telephone Encounter (Signed)
I don't have this form at this point.  Anything that came to me last week has already been addressed.  This may have already been sent back.   Let me know if I need to fill it out again.  Thanks.

## 2016-02-05 NOTE — Telephone Encounter (Signed)
[facility] left v/m requesting status of PWO request for ostomy supplies for pt. That was sent 10 days ago. Do not see under media tab. Cannot release pts ostomy shipment until gets PWO.Please advise.

## 2016-02-06 NOTE — Telephone Encounter (Signed)
Form done.  Thanks.  Please send in.

## 2016-02-06 NOTE — Telephone Encounter (Signed)
ppw refaxed and placed in Dr. Josefine Class In [place].

## 2016-02-06 NOTE — Telephone Encounter (Signed)
Called [facility] and requested that the ppw be refaxed to [phone].

## 2016-02-06 NOTE — Telephone Encounter (Signed)
Form Faxed--

## 2016-02-10 ENCOUNTER — Telehealth: Payer: Self-pay | Admitting: *Deleted

## 2016-02-10 NOTE — Telephone Encounter (Addendum)
Last week, we faxed an order for ostomy supplies to [facility].  They have received the fax for the order but say that there was more ppw faxed on 02/06/16 that needs to be done as a referral to [facility].  That is what was the ppw that was returned to me from you with a note from you saying "what's a PWO".  I have returned this ppw back to your In Box and apparently they are needing a referral to [facility] for his supplies.

## 2016-02-11 ENCOUNTER — Telehealth: Payer: Self-pay | Admitting: Family Medicine

## 2016-02-11 NOTE — Telephone Encounter (Signed)
Called and spoke to [place] at [facility] [phone]) ID # [id number]. Einar Pheasant stated that they do not require any type of referral or prior authorization on durable medical equipment and she does not know why [facility] is requesting that from Dr. Damita Dunnings.Einar Pheasant advised me to call Joneen Caraway back and give him their number and have him call them back. Called and gave information to Joneen Caraway and advised him to call [facility] and talk with them about what he thinks that he needs.

## 2016-02-11 NOTE — Telephone Encounter (Signed)
Patient returned [name]'s call.  Patient can be reached at [phone].

## 2016-02-11 NOTE — Telephone Encounter (Signed)
[facility] -[phone]  Spoke to [place] and was advised that [place] is physicians written order which they have on file that is good until next year. Bethanie Dicker stated that they have sent everything over to [facility] and they are just waiting to hear back from them with their okay to send the supplies out. Bethanie Dicker stated that they do not need anything else from Dr. Damita Dunnings. Called and advised patient of this and he stated that he is going to call [facility] and see what the hold up is and appreciated our help.

## 2016-02-11 NOTE — Telephone Encounter (Signed)
[name] with [name] called back and stated that a referral has to be done to [facility] for them to approve the supplies. [name] stated that he faxed the information over on 02/06/16 and will re-fax it today. [name] stated that the PCP has to call [facility] and get s prior approval on the supplies.

## 2016-02-11 NOTE — Telephone Encounter (Signed)
Pt calle dback - his id number for [facility] is [phone] Thank you

## 2016-02-11 NOTE — Telephone Encounter (Signed)
Returned patient's call. See other phone note.

## 2016-02-11 NOTE — Telephone Encounter (Signed)
I don't know how to refer to [facility].  I've never done that.  I don't know what they need or want.  I don't know what a PWO is.   Please get me some details about what they need and where they need it sent.  I'll work on it.

## 2016-02-11 NOTE — Telephone Encounter (Signed)
Left message at home number with wife to have patient call back. Mrs. Maharaj did not know about his supplies and stated that she would rather have him call back and discuss this.

## 2016-02-13 DIAGNOSIS — Z932 Ileostomy status: Secondary | ICD-10-CM | POA: Diagnosis not present

## 2016-02-29 ENCOUNTER — Other Ambulatory Visit (INDEPENDENT_AMBULATORY_CARE_PROVIDER_SITE_OTHER): Payer: Self-pay | Admitting: Orthopedic Surgery

## 2016-04-23 ENCOUNTER — Ambulatory Visit: Payer: Commercial Managed Care - HMO | Admitting: Cardiovascular Disease

## 2016-04-28 ENCOUNTER — Other Ambulatory Visit (INDEPENDENT_AMBULATORY_CARE_PROVIDER_SITE_OTHER): Payer: Self-pay | Admitting: Orthopedic Surgery

## 2016-05-07 ENCOUNTER — Encounter: Payer: Self-pay | Admitting: Cardiovascular Disease

## 2016-05-07 ENCOUNTER — Ambulatory Visit (INDEPENDENT_AMBULATORY_CARE_PROVIDER_SITE_OTHER): Payer: Medicare HMO | Admitting: Cardiovascular Disease

## 2016-05-07 VITALS — BP 158/90 | HR 60 | Ht 72.0 in | Wt 176.2 lb

## 2016-05-07 DIAGNOSIS — I480 Paroxysmal atrial fibrillation: Secondary | ICD-10-CM

## 2016-05-07 DIAGNOSIS — I5032 Chronic diastolic (congestive) heart failure: Secondary | ICD-10-CM

## 2016-05-07 DIAGNOSIS — I2510 Atherosclerotic heart disease of native coronary artery without angina pectoris: Secondary | ICD-10-CM | POA: Diagnosis not present

## 2016-05-07 DIAGNOSIS — I6523 Occlusion and stenosis of bilateral carotid arteries: Secondary | ICD-10-CM | POA: Diagnosis not present

## 2016-05-07 DIAGNOSIS — I10 Essential (primary) hypertension: Secondary | ICD-10-CM

## 2016-05-07 DIAGNOSIS — I25810 Atherosclerosis of coronary artery bypass graft(s) without angina pectoris: Secondary | ICD-10-CM | POA: Diagnosis not present

## 2016-05-07 DIAGNOSIS — I739 Peripheral vascular disease, unspecified: Secondary | ICD-10-CM

## 2016-05-07 MED ORDER — ROSUVASTATIN CALCIUM 5 MG PO TABS: 5.0000 mg | ORAL_TABLET | Freq: Every day | ORAL | 3 refills | Status: DC

## 2016-05-07 MED ORDER — APIXABAN 5 MG PO TABS: 5.0000 mg | ORAL_TABLET | Freq: Two times a day (BID) | ORAL | 11 refills | Status: DC

## 2016-05-07 NOTE — Patient Instructions (Addendum)
Medication Instructions:   Stop aspirin Restart eliquis twice a day  Please restart crestor for cholesterol  Please monitor blood pressure at home Call the office if it runs high   Labwork:  No new labs needed  Testing/Procedures:  No further testing at this time   I recommend watching educational videos on topics of interest to you at:       [email or web address]  Enter code: [email or web address]    Follow-Up: It was a pleasure seeing you in the office today. Please call us if you have new issues that need to be addressed before your next appt.  [phone]  Your physician wants you to follow-up in: 6 months.  You will receive a reminder letter in the mail two months in advance. If you don't receive a letter, please call our office to schedule the follow-up appointment.  If you need a refill on your cardiac medications before your next appointment, please call your pharmacy.
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
## 2016-05-14 DIAGNOSIS — Z932 Ileostomy status: Secondary | ICD-10-CM | POA: Diagnosis not present

## 2016-06-26 ENCOUNTER — Other Ambulatory Visit (INDEPENDENT_AMBULATORY_CARE_PROVIDER_SITE_OTHER): Payer: Self-pay | Admitting: Orthopedic Surgery

## 2016-06-26 IMAGING — DX DG CHEST 2V
2 series · 2 of 2 positions shown · non-contrast
Comparison: 06/06/2014

CLINICAL DATA: Preop examination for orthopedic surgery toe injury

EXAM:
CHEST  2 VIEW

[chest lat]
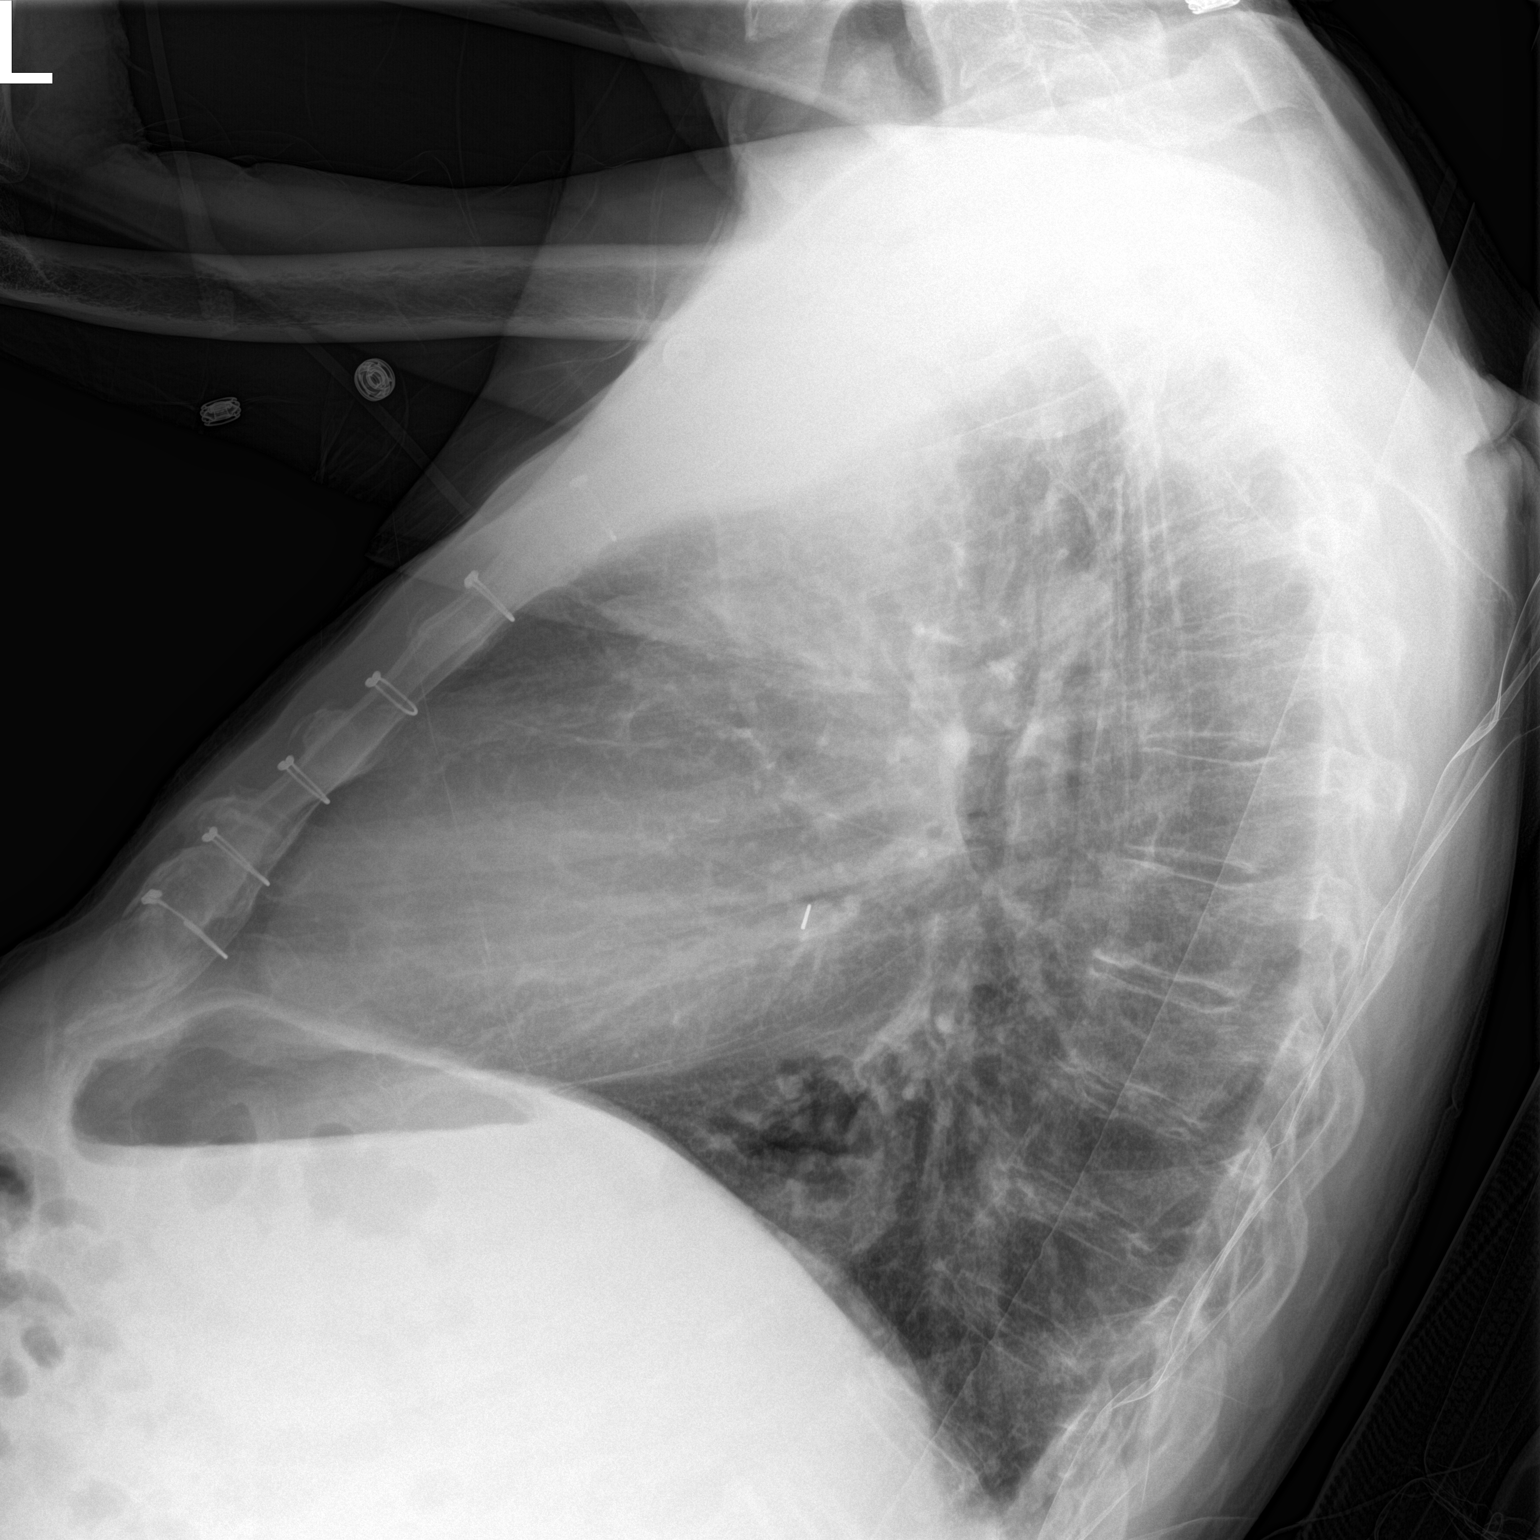

[chest ap]
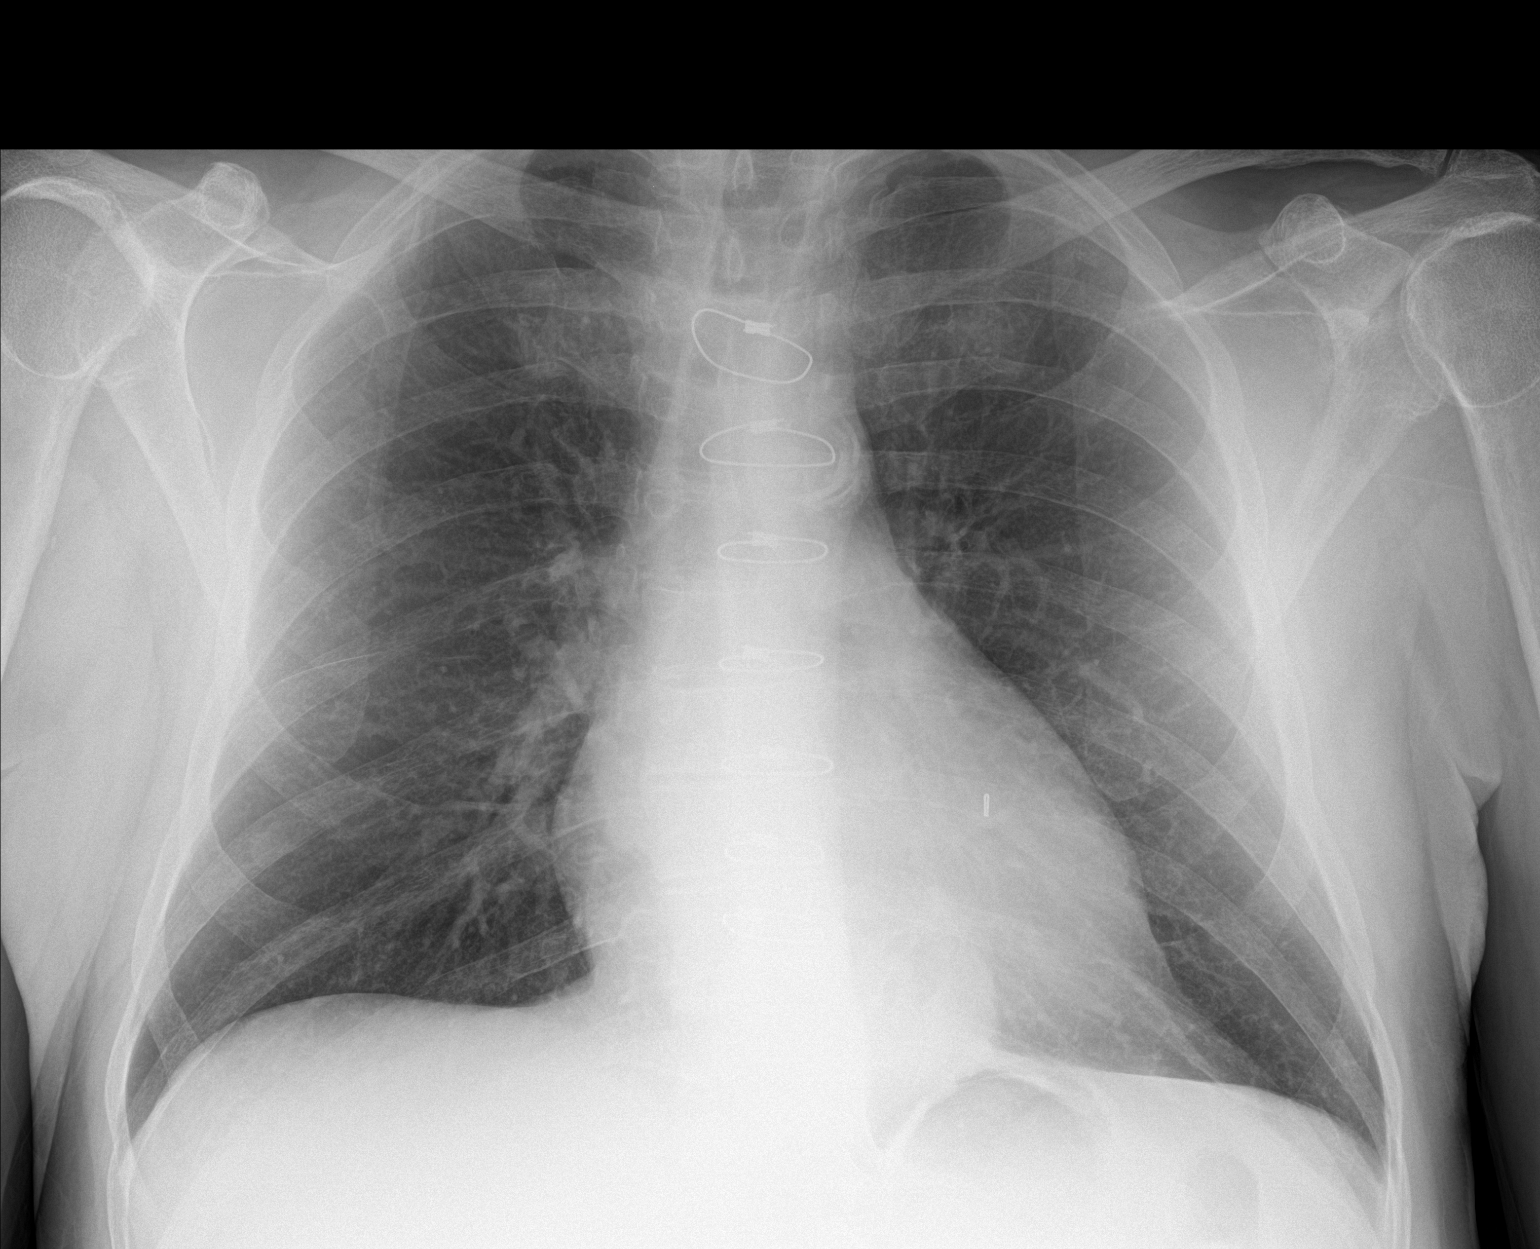

[2 of 2 positions shown; findings below may reference images not displayed]

FINDINGS: Moderate hiatal hernia. Mild cardiac enlargement. Vascular pattern
normal. Calcified aortic arch. Lungs clear. No effusion.
IMPRESSION: Moderate hiatal hernia with mild cardiac enlargement

## 2016-07-12 ENCOUNTER — Other Ambulatory Visit: Payer: Self-pay | Admitting: Cardiovascular Disease

## 2016-07-12 DIAGNOSIS — I6523 Occlusion and stenosis of bilateral carotid arteries: Secondary | ICD-10-CM

## 2016-07-27 ENCOUNTER — Other Ambulatory Visit (INDEPENDENT_AMBULATORY_CARE_PROVIDER_SITE_OTHER): Payer: Self-pay | Admitting: Radiology

## 2016-07-27 ENCOUNTER — Ambulatory Visit: Payer: Medicare HMO

## 2016-07-27 DIAGNOSIS — I6523 Occlusion and stenosis of bilateral carotid arteries: Secondary | ICD-10-CM

## 2016-07-27 MED ORDER — ALLOPURINOL 100 MG PO TABS: 100.0000 mg | ORAL_TABLET | Freq: Two times a day (BID) | ORAL | 0 refills | Status: DC

## 2016-07-27 NOTE — Telephone Encounter (Signed)
OK refill prescription with 6 refills

## 2016-07-28 LAB — VAS US CAROTID
LCCADDIAS: 15 cm/s
LCCADSYS: 56 cm/s
LCCAPSYS: 78 cm/s
LEFT ECA DIAS: 22 cm/s
LEFT VERTEBRAL DIAS: 18 cm/s
LICADDIAS: -32 cm/s
LICADSYS: -100 cm/s
LICAPSYS: -147 cm/s
Left CCA prox dias: 24 cm/s
Left ICA prox dias: -62 cm/s
RCCAPSYS: -68 cm/s
RIGHT ECA DIAS: 18 cm/s
RIGHT VERTEBRAL DIAS: 7 cm/s
Right CCA prox dias: -14 cm/s
Right cca dist sys: -92 cm/s

## 2016-08-12 DIAGNOSIS — Z932 Ileostomy status: Secondary | ICD-10-CM | POA: Diagnosis not present

## 2016-09-30 ENCOUNTER — Ambulatory Visit (HOSPITAL_COMMUNITY)
Admission: RE | Admit: 2016-09-30 | Discharge: 2016-09-30 | Disposition: A | Discharge status: Home / Self Care | Payer: Medicare HMO | Source: Ambulatory Visit | Attending: Urology | Admitting: Urology

## 2016-09-30 ENCOUNTER — Other Ambulatory Visit: Payer: Self-pay | Admitting: Urology

## 2016-09-30 DIAGNOSIS — C672 Malignant neoplasm of lateral wall of bladder: Secondary | ICD-10-CM

## 2016-09-30 DIAGNOSIS — C679 Malignant neoplasm of bladder, unspecified: Secondary | ICD-10-CM | POA: Diagnosis not present

## 2016-10-04 DIAGNOSIS — C672 Malignant neoplasm of lateral wall of bladder: Secondary | ICD-10-CM | POA: Diagnosis not present

## 2016-10-04 DIAGNOSIS — R310 Gross hematuria: Secondary | ICD-10-CM | POA: Diagnosis not present

## 2016-10-04 DIAGNOSIS — C679 Malignant neoplasm of bladder, unspecified: Secondary | ICD-10-CM | POA: Diagnosis not present

## 2016-10-23 ENCOUNTER — Other Ambulatory Visit (INDEPENDENT_AMBULATORY_CARE_PROVIDER_SITE_OTHER): Payer: Self-pay | Admitting: Orthopedic Surgery

## 2016-11-12 DIAGNOSIS — Z932 Ileostomy status: Secondary | ICD-10-CM | POA: Diagnosis not present

## 2016-11-25 ENCOUNTER — Other Ambulatory Visit: Payer: Self-pay | Admitting: Family Medicine

## 2016-11-29 DIAGNOSIS — N1330 Unspecified hydronephrosis: Secondary | ICD-10-CM | POA: Diagnosis not present

## 2016-11-29 DIAGNOSIS — C672 Malignant neoplasm of lateral wall of bladder: Secondary | ICD-10-CM | POA: Diagnosis not present

## 2016-11-29 DIAGNOSIS — N183 Chronic kidney disease, stage 3 (moderate): Secondary | ICD-10-CM | POA: Diagnosis not present

## 2016-12-13 ENCOUNTER — Other Ambulatory Visit: Payer: Self-pay | Admitting: Family Medicine

## 2016-12-13 ENCOUNTER — Ambulatory Visit (INDEPENDENT_AMBULATORY_CARE_PROVIDER_SITE_OTHER): Payer: Medicare HMO

## 2016-12-13 DIAGNOSIS — Z23 Encounter for immunization: Secondary | ICD-10-CM

## 2016-12-13 DIAGNOSIS — E790 Hyperuricemia without signs of inflammatory arthritis and tophaceous disease: Secondary | ICD-10-CM

## 2016-12-13 DIAGNOSIS — Z862 Personal history of diseases of the blood and blood-forming organs and certain disorders involving the immune mechanism: Secondary | ICD-10-CM

## 2016-12-13 DIAGNOSIS — E785 Hyperlipidemia, unspecified: Secondary | ICD-10-CM

## 2016-12-13 NOTE — Progress Notes (Signed)
PCP requested patient have flu immunization. Patient tolerated procedure well.
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
## 2016-12-21 ENCOUNTER — Ambulatory Visit: Payer: Medicare HMO | Admitting: Cardiovascular Disease

## 2017-01-23 ENCOUNTER — Encounter (HOSPITAL_COMMUNITY)
Admission: EM | Disposition: A | Discharge status: Home / Self Care | Payer: Self-pay | Source: Home / Self Care | Attending: Emergency Medicine

## 2017-01-23 ENCOUNTER — Encounter (HOSPITAL_COMMUNITY): Payer: Self-pay | Admitting: *Deleted

## 2017-01-23 ENCOUNTER — Ambulatory Visit (HOSPITAL_COMMUNITY)
Admission: EM | Admit: 2017-01-23 | Discharge: 2017-01-24 | Disposition: A | Discharge status: Home / Self Care | Payer: Medicare HMO | Attending: Emergency Medicine | Admitting: Emergency Medicine

## 2017-01-23 DIAGNOSIS — K449 Diaphragmatic hernia without obstruction or gangrene: Secondary | ICD-10-CM | POA: Diagnosis not present

## 2017-01-23 DIAGNOSIS — Z7901 Long term (current) use of anticoagulants: Secondary | ICD-10-CM

## 2017-01-23 DIAGNOSIS — K219 Gastro-esophageal reflux disease without esophagitis: Secondary | ICD-10-CM | POA: Insufficient documentation

## 2017-01-23 DIAGNOSIS — I2510 Atherosclerotic heart disease of native coronary artery without angina pectoris: Secondary | ICD-10-CM | POA: Insufficient documentation

## 2017-01-23 DIAGNOSIS — X58XXXA Exposure to other specified factors, initial encounter: Secondary | ICD-10-CM | POA: Insufficient documentation

## 2017-01-23 DIAGNOSIS — Z87891 Personal history of nicotine dependence: Secondary | ICD-10-CM | POA: Insufficient documentation

## 2017-01-23 DIAGNOSIS — Z951 Presence of aortocoronary bypass graft: Secondary | ICD-10-CM | POA: Insufficient documentation

## 2017-01-23 DIAGNOSIS — Z79899 Other long term (current) drug therapy: Secondary | ICD-10-CM | POA: Insufficient documentation

## 2017-01-23 DIAGNOSIS — T18128A Food in esophagus causing other injury, initial encounter: Secondary | ICD-10-CM | POA: Insufficient documentation

## 2017-01-23 DIAGNOSIS — T18120A Food in esophagus causing compression of trachea, initial encounter: Secondary | ICD-10-CM | POA: Diagnosis not present

## 2017-01-23 DIAGNOSIS — M1990 Unspecified osteoarthritis, unspecified site: Secondary | ICD-10-CM | POA: Insufficient documentation

## 2017-01-23 DIAGNOSIS — W44F3XA Food entering into or through a natural orifice, initial encounter: Secondary | ICD-10-CM | POA: Insufficient documentation

## 2017-01-23 DIAGNOSIS — Z888 Allergy status to other drugs, medicaments and biological substances status: Secondary | ICD-10-CM | POA: Insufficient documentation

## 2017-01-23 DIAGNOSIS — K222 Esophageal obstruction: Secondary | ICD-10-CM | POA: Diagnosis not present

## 2017-01-23 DIAGNOSIS — F419 Anxiety disorder, unspecified: Secondary | ICD-10-CM | POA: Diagnosis not present

## 2017-01-23 DIAGNOSIS — I129 Hypertensive chronic kidney disease with stage 1 through stage 4 chronic kidney disease, or unspecified chronic kidney disease: Secondary | ICD-10-CM | POA: Insufficient documentation

## 2017-01-23 DIAGNOSIS — N183 Chronic kidney disease, stage 3 (moderate): Secondary | ICD-10-CM | POA: Diagnosis not present

## 2017-01-23 HISTORY — PX: ESOPHAGOGASTRODUODENOSCOPY: SHX5428

## 2017-01-23 SURGERY — EGD (ESOPHAGOGASTRODUODENOSCOPY)
Anesthesia: Moderate Sedation

## 2017-01-23 MED ORDER — DIPHENHYDRAMINE HCL 50 MG/ML IJ SOLN
INTRAMUSCULAR | Status: AC
  Filled 2017-01-23: qty 1

## 2017-01-23 MED ORDER — MIDAZOLAM HCL 5 MG/ML IJ SOLN
INTRAMUSCULAR | Status: AC
  Filled 2017-01-23: qty 3

## 2017-01-23 MED ORDER — FENTANYL CITRATE (PF) 100 MCG/2ML IJ SOLN
INTRAMUSCULAR | Status: AC
  Filled 2017-01-23: qty 4

## 2017-01-23 NOTE — ED Notes (Signed)
ED Provider at bedside. 
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
## 2017-01-23 NOTE — Interval H&P Note (Signed)
History and Physical Interval Note:  01/23/2017 11:45 PM  [name].  has presented today for surgery, with the diagnosis of food impaction  The various methods of treatment have been discussed with the patient and family. After consideration of risks, benefits and other options for treatment, the patient has consented to  Procedure(s): ESOPHAGOGASTRODUODENOSCOPY (EGD) (N/A) as a surgical intervention .  The patient's history has been reviewed, patient examined, no change in status, stable for surgery.  I have reviewed the patient's chart and labs.  Questions were answered to the patient's satisfaction.     [place]

## 2017-01-23 NOTE — ED Triage Notes (Signed)
Onset tonight pt took first bite of steak and steak is stuck at lower esophagus.  Unable to get fluids past steak, fluids will build and pt will have to vomit.
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
## 2017-01-23 NOTE — ED Notes (Signed)
Endo team at bedside

## 2017-01-24 ENCOUNTER — Telehealth: Payer: Self-pay

## 2017-01-24 ENCOUNTER — Encounter (HOSPITAL_COMMUNITY): Payer: Self-pay | Admitting: Gastroenterology

## 2017-01-24 DIAGNOSIS — K449 Diaphragmatic hernia without obstruction or gangrene: Secondary | ICD-10-CM

## 2017-01-24 DIAGNOSIS — K222 Esophageal obstruction: Secondary | ICD-10-CM

## 2017-01-24 NOTE — Telephone Encounter (Signed)
Patient Name: [name] Gender: Male DOB: December 06, 1931 Age: 83 Y 37 M 15 D Return Phone Number: [phone] (Primary) Address: City/State/Zip: [place] Client [place] Night - Client Client Site [place] Physician Renford Dills - MD Contact Type Call Who Is Calling Patient / Member / Family / Caregiver Call Type Triage / Clinical Relationship To Patient Self Return Phone Number [phone] (Primary) Chief Complaint CHOKING / [place] Reason for Call Symptomatic / Request for [place] states is has meat stuck in his throat. Translation No Nurse Assessment Nurse: Donovan Kail, RN, Barnetta Chapel Date/Time (Eastern Time): 01/23/2017 7:15:39 PM Confirm and document reason for call. If symptomatic, describe symptoms. ---Caller states is has meat stuck in his throat. Meat can't go up or done. Does the patient have any new or worsening symptoms? ---Yes Will a triage be completed? ---Yes Related visit to physician within the last 2 weeks? ---No Does the PT have any chronic conditions? (i.e. diabetes, asthma, etc.) ---Yes List chronic conditions. ---GERD, hiatal hernia, Eloquist- blood thinner, A fib. Is this a behavioral health or substance abuse call? ---No Guidelines Guideline Title Affirmed Question Affirmed Notes Nurse Date/Time (Eastern Time) Swallowing Difficulty [1] Symptoms of blocked esophagus (e.g., can't swallow normal secretions, drooling) AND [2] present now Idamae Lusher 01/23/2017 7:16:55 PM Disp. Time Eilene Ghazi Time) Disposition Final User 01/23/2017 7:10:08 PM Send to Urgent Queue Claudette Laws 01/23/2017 7:10:19 PM Send to Urgent Queue Claudette Laws 01/23/2017 7:10:27 PM Send to Urgent Queue Claudette Laws 01/23/2017 7:19:49 PM Go to ED Now Yes Donovan Kail, RN, Barnetta Chapel PLEASE NOTE: All timestamps contained within this report are represented as [place] Standard  Time. CONFIDENTIALTY NOTICE: This fax transmission is intended only for the addressee. It contains information that is legally privileged, confidential or otherwise protected from use or disclosure. If you are not the intended recipient, you are strictly prohibited from reviewing, disclosing, copying using or disseminating any of this information or taking any action in reliance on or regarding this information. If you have received this fax in error, please notify us immediately by telephone so that we can arrange for its return to [place]. Phone: [phone], Toll-Free: [phone], Fax: [phone] Page: 2 of 2 Call Id: [id number] [place] Disagree/Comply Comply Caller Understands Yes PreDisposition InappropriateToAsk Care Advice Given Per Guideline GO TO ED NOW: You need to be seen in the Emergency Department. Go to the ER at ___________ [facility] now. Drive carefully. DRIVING: Another adult should drive. CARE ADVICE given per Swallowed Difficulty (Adult) guideline. NOTHING BY MOUTH: Do not eat or drink anything for now. (Reason: condition may require sedation, anesthesia, and endoscopy) Referrals [facility] - ED

## 2017-01-24 NOTE — ED Notes (Signed)
Pt is awake and alert. I spoke with the pt son, Paco, who will be here in about 45 minutes to pick the patient up. Provider notified of the same.

## 2017-01-24 NOTE — ED Notes (Signed)
ED Provider at bedside. 
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
## 2017-01-24 NOTE — Op Note (Signed)
[facility] Patient Name: [name] Procedure Date : 01/23/2017 MRN: [id number] Attending MD: Carlota Raspberry. Arham Symmonds MD, MD Date of Birth: [birth year] CSN: [id number] Age: 83 Admit Type: Outpatient Procedure:                Upper GI endoscopy Indications:              Foreign body in the esophagus - food impaction,                            history of dysphagia. History of reflux, not on                            medication, patient fearful of side effects of PPIs Providers:                Carlota Raspberry. Dabria Wadas MD, MD, Elmer Ramp. Tilden Dome, RN,                            William Dalton, Technician Referring MD:              Medicines:                Fentanyl 50 micrograms IV, Midazolam 4 mg IV Complications:            No immediate complications. Estimated blood loss:                            Minimal. Estimated Blood Loss:     Estimated blood loss was minimal. Procedure:                Pre-Anesthesia Assessment:                           - Prior to the procedure, a History and Physical                            was performed, and patient medications and                            allergies were reviewed. The patient's tolerance of                            previous anesthesia was also reviewed. The risks                            and benefits of the procedure and the sedation                            options and risks were discussed with the patient.                            All questions were answered, and informed consent                            was obtained. Prior Anticoagulants: The patient has  taken Eliquis (apixaban), last dose was day of                            procedure. ASA Grade Assessment: III - A patient                            with severe systemic disease. After reviewing the                            risks and benefits, the patient was deemed in                            satisfactory condition to undergo the  procedure.                           After obtaining informed consent, the endoscope was                            passed under direct vision. Throughout the                            procedure, the patient's blood pressure, pulse, and                            oxygen saturations were monitored continuously. The                            EG-[id number] ([id number]) scope was introduced through the                            mouth, and advanced to the body of the stomach. The                            upper GI endoscopy was accomplished without                            difficulty. The patient tolerated the procedure                            well. Scope In: Scope Out: Findings:      Esophagogastric landmarks were identified: the Z-line was found at 37 cm       from the incisors.      A large hiatal hernia was present.      One moderate benign-appearing, intrinsic stenosis was found 37 cm from       the incisors. This measured less than one cm (in length). The site was       inflamed with evidence of recent impaction. At the time of endoscopy,       the food bolus had already passed into the stomach. Dilation was not       performed given the patient's Eliquis use today.      The exam of the esophagus was otherwise normal.      A bolus of steak was found in the gastric cardia cardia.  The gastric body was normal. Full examination not performed due to       residual food in the stomach Impression:               - Esophagogastric landmarks identified.                           - Large hiatal hernia.                           - Benign-appearing esophageal stenosis at the lower                            esophagus which was the site of impaction.                            Inflammation noted, food bolus had spontaneously                            passed into the stomach. Dilation not performed                            given Eliquis use                           - Normal gastric  body. Moderate Sedation:      Moderate (conscious) sedation was administered by the endoscopy nurse       and supervised by the endoscopist. The following parameters were       monitored: oxygen saturation, heart rate, blood pressure, and response       to care. Total physician intraservice time was 12 minutes. Recommendation:           - Patient has a contact number available for                            emergencies. The signs and symptoms of potential                            delayed complications were discussed with the                            patient. Return to normal activities tomorrow.                            Written discharge instructions were provided to the                            patient. Patient can be discharged home if he has a                            ride home.                           - Full liquid diet, advance to soft as tolerated  tomorrow. NO MEATS until follow up is scheduled for                            dilation                           - Continue present medications.                           - Start Zantac 150mg  twice daily (patient has                            concerns about risks of PPIs)                           - Repeat upper endoscopy for retreatment in a few                            weeks, our office will call to schedule. Eliquis                            will need to be held prior to your next examination                            so dilation can be safely performed. Procedure Code(s):        --- Professional ---                           [phone], 52, Esophagogastroduodenoscopy, flexible,                            transoral; diagnostic, including collection of                            specimen(s) by brushing or washing, when performed                            (separate procedure)                           99152, Moderate sedation services provided by the                            same physician or  other qualified health care                            professional performing the diagnostic or                            therapeutic service that the sedation supports,                            requiring the presence of an independent trained  observer to assist in the monitoring of the                            patient's level of consciousness and physiological                            status; initial 15 minutes of intraservice time,                            patient age 48 years or older Diagnosis Code(s):        --- Professional ---                           K44.9, Diaphragmatic hernia without obstruction or                            gangrene                           K22.2, Esophageal obstruction                           T18.108A, Unspecified foreign body in esophagus                            causing other injury, initial encounter CPT copyright 2016 American Medical Association. All rights reserved. The codes documented in this report are preliminary and upon coder review may  be revised to meet current compliance requirements. Remo Lipps P. Nyasia Baxley MD, MD 01/24/2017 12:12:27 AM This report has been signed electronically. Number of Addenda: 0

## 2017-01-24 NOTE — Discharge Instructions (Signed)
Follow-up at GI specialist listed below for further evaluation. Return to ED for chest pain, trouble breathing, worsening symptoms, severe abdominal pain, increased vomiting.

## 2017-01-25 ENCOUNTER — Telehealth: Payer: Self-pay

## 2017-01-25 ENCOUNTER — Telehealth: Payer: Self-pay | Admitting: Family Medicine

## 2017-01-25 ENCOUNTER — Telehealth: Payer: Self-pay | Admitting: Internal Medicine

## 2017-01-25 NOTE — Telephone Encounter (Signed)
Patient is scheduled for EGd on 02/16/17 and pre- visit for 02/04/17.  He is aware that I will obtain permission for him to hold his Eliquis prior to procedure.  He has an appt with Dr. Rockey Situ this Friday and will mention it to him at the appt as well.

## 2017-01-25 NOTE — Telephone Encounter (Signed)
-----   Message from Yetta Flock, MD sent at 01/24/2017 12:13 AM EDT ----- Regarding: food impaction follow up Hi [name], This is a patient of Dr. Carlean Purl. He came in with a food impaction tonight. He needs a follow up EGD with Dr. Carlean Purl in a few weeks for dilation of GEJ stricture. He is on Eliquis and will need approval to hold this. Can you help coordinate? Thanks  [name]

## 2017-01-25 NOTE — Telephone Encounter (Signed)
error 

## 2017-01-25 NOTE — Telephone Encounter (Signed)
A user error has taken place: Error °

## 2017-01-25 NOTE — Telephone Encounter (Signed)
Dr. Rockey Situ,   [name] is scheduled for a EGD with dilation on 02/16/17 with Dr. Carlean Purl.  Please advise how long he may hold his Eliquis prior to the procedure.

## 2017-01-25 NOTE — Telephone Encounter (Signed)
See duplicate phone note 

## 2017-01-28 ENCOUNTER — Ambulatory Visit (INDEPENDENT_AMBULATORY_CARE_PROVIDER_SITE_OTHER): Payer: Medicare HMO | Admitting: Cardiovascular Disease

## 2017-01-28 ENCOUNTER — Encounter: Payer: Self-pay | Admitting: Cardiovascular Disease

## 2017-01-28 VITALS — BP 150/80 | HR 84 | Ht 72.0 in | Wt 160.5 lb

## 2017-01-28 DIAGNOSIS — I25708 Atherosclerosis of coronary artery bypass graft(s), unspecified, with other forms of angina pectoris: Secondary | ICD-10-CM

## 2017-01-28 DIAGNOSIS — I10 Essential (primary) hypertension: Secondary | ICD-10-CM

## 2017-01-28 DIAGNOSIS — I6523 Occlusion and stenosis of bilateral carotid arteries: Secondary | ICD-10-CM | POA: Diagnosis not present

## 2017-01-28 DIAGNOSIS — I480 Paroxysmal atrial fibrillation: Secondary | ICD-10-CM

## 2017-01-28 DIAGNOSIS — I5032 Chronic diastolic (congestive) heart failure: Secondary | ICD-10-CM | POA: Diagnosis not present

## 2017-01-28 DIAGNOSIS — N183 Chronic kidney disease, stage 3 unspecified: Secondary | ICD-10-CM

## 2017-01-28 DIAGNOSIS — I739 Peripheral vascular disease, unspecified: Secondary | ICD-10-CM | POA: Diagnosis not present

## 2017-01-28 MED ORDER — NITROGLYCERIN 0.4 MG SL SUBL: 0.4000 mg | SUBLINGUAL_TABLET | SUBLINGUAL | 3 refills | Status: DC | PRN

## 2017-01-28 MED ORDER — METOPROLOL SUCCINATE ER 25 MG PO TB24: 25.0000 mg | ORAL_TABLET | Freq: Every day | ORAL | 3 refills | Status: DC

## 2017-01-28 NOTE — Telephone Encounter (Signed)
Okay for him to hold his anticoagulation, eliquis 2 days prior to the procedure He is aware Instructions provided on today's visit to the office

## 2017-01-28 NOTE — Patient Instructions (Addendum)
For EGD, on Nov 14th Hold the eliquis 2 days before the procedure   Medication Instructions:   Please start metoprolol one a day for blood pressures  Labwork:  No new labs needed  Testing/Procedures:  No further testing at this time   Follow-Up: It was a pleasure seeing you in the office today. Please call us if you have new issues that need to be addressed before your next appt.  [phone]  Your physician wants you to follow-up in: 12 months.  You will receive a reminder letter in the mail two months in advance. If you don't receive a letter, please call our office to schedule the follow-up appointment.  If you need a refill on your cardiac medications before your next appointment, please call your pharmacy.

## 2017-02-03 ENCOUNTER — Telehealth: Payer: Self-pay | Admitting: *Deleted

## 2017-02-03 NOTE — Telephone Encounter (Signed)
Form received from [facility] for ostomy supplies.  Form Placed in Dr. Josefine Class In Box.

## 2017-02-03 NOTE — Telephone Encounter (Signed)
I'll work on the hard copy.  Thanks.  

## 2017-02-04 ENCOUNTER — Telehealth: Payer: Self-pay | Admitting: Family Medicine

## 2017-02-04 ENCOUNTER — Ambulatory Visit (AMBULATORY_SURGERY_CENTER): Payer: Self-pay

## 2017-02-04 VITALS — Ht 72.0 in | Wt 162.0 lb

## 2017-02-04 DIAGNOSIS — R1310 Dysphagia, unspecified: Secondary | ICD-10-CM

## 2017-02-04 NOTE — Telephone Encounter (Signed)
Appt has been made.No [employer] referrals are required any longer. Patient advised.

## 2017-02-04 NOTE — Telephone Encounter (Signed)
Form faxed and confirmed

## 2017-02-04 NOTE — Telephone Encounter (Signed)
Appears there is a referral in pts chart.Please advise.

## 2017-02-04 NOTE — Progress Notes (Signed)
Denies allergies to eggs or soy products. Denies complication of anesthesia or sedation. Denies use of weight loss medication. Denies use of O2.   Emmi instructions declined.  

## 2017-02-04 NOTE — Telephone Encounter (Signed)
Copied from [facility] #[id number]. Topic: Referral - Request >> Feb 04, 2017  2:43 PM Cleaster Corin, NT wrote: CRM for notification. See Telephone encounter for:  02/04/17. Pt. Called and said that he needs a referral for endo. on 11/17 dr. Joie Bimler

## 2017-02-08 ENCOUNTER — Encounter: Payer: Self-pay | Admitting: Internal Medicine

## 2017-02-14 DIAGNOSIS — Z932 Ileostomy status: Secondary | ICD-10-CM | POA: Diagnosis not present

## 2017-02-16 ENCOUNTER — Ambulatory Visit (AMBULATORY_SURGERY_CENTER): Payer: Medicare HMO | Admitting: Internal Medicine

## 2017-02-16 ENCOUNTER — Encounter: Payer: Self-pay | Admitting: Internal Medicine

## 2017-02-16 VITALS — BP 116/88 | HR 70 | Temp 97.5°F | Resp 11 | Ht 72.0 in | Wt 162.0 lb

## 2017-02-16 DIAGNOSIS — R1310 Dysphagia, unspecified: Secondary | ICD-10-CM

## 2017-02-16 DIAGNOSIS — K222 Esophageal obstruction: Secondary | ICD-10-CM | POA: Diagnosis not present

## 2017-02-16 DIAGNOSIS — I2510 Atherosclerotic heart disease of native coronary artery without angina pectoris: Secondary | ICD-10-CM | POA: Diagnosis not present

## 2017-02-16 MED ORDER — SODIUM CHLORIDE 0.9 % IV SOLN: 500.0000 mL | INTRAVENOUS | Status: DC

## 2017-02-16 MED ORDER — PANTOPRAZOLE SODIUM 40 MG PO TBEC: 40.0000 mg | DELAYED_RELEASE_TABLET | Freq: Every day | ORAL | 3 refills | Status: DC

## 2017-02-16 NOTE — Patient Instructions (Addendum)
I stretched the esophagus so you can swallow better.  I want you to take pantoprazole every day to reduce acid reflux and prevent this from happening again. If you get blood in the urine again let me know - I hope not.  If your swallowing is not 100% better by January let me know - we might need to stretch it again.  RESTART ELIQUIS TOMORROW 02/17/17  I appreciate the opportunity to care for you. Gatha Mayer, MD, FACG YOU HAD AN ENDOSCOPIC PROCEDURE TODAY AT [facility]:   Refer to the procedure report that was given to you for any specific questions about what was found during the examination.  If the procedure report does not answer your questions, please call your gastroenterologist to clarify.  If you requested that your care partner not be given the details of your procedure findings, then the procedure report has been included in a sealed envelope for you to review at your convenience later.  YOU SHOULD EXPECT: Some feelings of bloating in the abdomen. Passage of more gas than usual.  Walking can help get rid of the air that was put into your GI tract during the procedure and reduce the bloating. If you had a lower endoscopy (such as a colonoscopy or flexible sigmoidoscopy) you may notice spotting of blood in your stool or on the toilet paper. If you underwent a bowel prep for your procedure, you may not have a normal bowel movement for a few days.  Please Note:  You might notice some irritation and congestion in your nose or some drainage.  This is from the oxygen used during your procedure.  There is no need for concern and it should clear up in a day or so.  SYMPTOMS TO REPORT IMMEDIATELY:   Following lower endoscopy (colonoscopy or flexible sigmoidoscopy):  Excessive amounts of blood in the stool  Significant tenderness or worsening of abdominal pains  Swelling of the abdomen that is new, acute  Fever of 100F or higher   Following upper endoscopy  (EGD)  Vomiting of blood or coffee ground material  New chest pain or pain under the shoulder blades  Painful or persistently difficult swallowing  New shortness of breath  Fever of 100F or higher  Black, tarry-looking stools  For urgent or emergent issues, a gastroenterologist can be reached at any hour by calling [phone].   DIET:  Follow a post-dilation diet: Clear liquids for 1 hour starting at 1150, then soft foods for the rest of the day starting at 1250. Resume regular diet tomorrow. (See post-dilation diet handout given to you by your recovery nurse). Drink plenty of fluids but you should avoid alcoholic beverages for 24 hours.  MEDICATIONS: Continue present medications. Start Pantoprazole 40 mg daily before breakfast. Discontinue Ranitidine. He stopped the omeprazole as he thinks it cause hematuria. Resume Eliquis (Apixaban) at prior dose tomorrow.  Please see handouts given to you by your recovery nurse.  ACTIVITY:  You should plan to take it easy for the rest of today and you should NOT DRIVE or use heavy machinery until tomorrow (because of the sedation medicines used during the test).    FOLLOW UP: Our staff will call the number listed on your records the next business day following your procedure to check on you and address any questions or concerns that you may have regarding the information given to you following your procedure. If we do not reach you, we will leave a message.  However, if you are feeling well and you are not experiencing any problems, there is no need to return our call.  We will assume that you have returned to your regular daily activities without incident.  If any biopsies were taken you will be contacted by phone or by letter within the next 1-3 weeks.  Please call us at [phone] if you have not heard about the biopsies in 3 weeks.   Thank you for allowing [place] to provide for your healthcare needs today.  SIGNATURES/CONFIDENTIALITY: You  and/or your care partner have signed paperwork which will be entered into your electronic medical record.  These signatures attest to the fact that that the information above on your After Visit Summary has been reviewed and is understood.  Full responsibility of the confidentiality of this discharge information lies with you and/or your care-partner.

## 2017-02-16 NOTE — Progress Notes (Signed)
Called to room to assist during endoscopic procedure.  Patient ID and intended procedure confirmed with present staff. Received instructions for my participation in the procedure from the performing physician.  

## 2017-02-16 NOTE — Progress Notes (Signed)
A/ox3 pleased with MAC, report to [place]

## 2017-02-16 NOTE — Op Note (Signed)
[facility] Patient Name: [name] Procedure Date: 02/16/2017 10:28 AM MRN: [id number] Endoscopist: Gatha Mayer , MD Age: 83 Referring MD:  Date of Birth: [birth year] Gender: Male Account #: [id number] Procedure:                Upper GI endoscopy Indications:              Dysphagia, Stricture of the esophagus, For therapy                            of esophageal stricture Medicines:                Propofol per Anesthesia, Monitored Anesthesia Care Procedure:                Pre-Anesthesia Assessment:                           - Prior to the procedure, a History and Physical                            was performed, and patient medications and                            allergies were reviewed. The patient's tolerance of                            previous anesthesia was also reviewed. The risks                            and benefits of the procedure and the sedation                            options and risks were discussed with the patient.                            All questions were answered, and informed consent                            was obtained. Prior Anticoagulants: The patient                            last took Eliquis (apixaban) 3 days prior to the                            procedure. ASA Grade Assessment: III - A patient                            with severe systemic disease. After reviewing the                            risks and benefits, the patient was deemed in                            satisfactory condition to undergo the procedure.  After obtaining informed consent, the endoscope was                            passed under direct vision. Throughout the                            procedure, the patient's blood pressure, pulse, and                            oxygen saturations were monitored continuously. The                            Endoscope was introduced through the mouth, and                            advanced  to the second part of duodenum. The upper                            GI endoscopy was accomplished without difficulty.                            The patient tolerated the procedure well. Scope In: Scope Out: Findings:                 One moderate benign-appearing, intrinsic stenosis                            was found at the gastroesophageal junction. This                            measured 1.5 cm (inner diameter) x less than one cm                            (in length) and was traversed. A TTS dilator was                            passed through the scope. Dilation with an 18-19-20                            mm balloon dilator was performed to 18 mm. The                            dilation site was examined and showed moderate                            improvement in luminal narrowing. Estimated blood                            loss was minimal.                           A medium-sized hiatal hernia was present.  The exam was otherwise without abnormality.                           The cardia and gastric fundus were otherwise normal                            on retroflexion. Complications:            No immediate complications. Estimated Blood Loss:     Estimated blood loss was minimal. Impression:               - Benign-appearing esophageal stenosis. Dilated.                           - Medium-sized hiatal hernia.                           - The examination was otherwise normal.                           - No specimens collected. Recommendation:           - Patient has a contact number available for                            emergencies. The signs and symptoms of potential                            delayed complications were discussed with the                            patient. Return to normal activities tomorrow.                            Written discharge instructions were provided to the                            patient.                            - Clear liquids x 1 hour then soft foods rest of                            day. Start prior diet tomorrow.                           - Continue present medications.                           - Start pantoprazole 40 mg daily before breakfast,                            d/c ranitidine                           He stopped omeprazole as he thinks it caused  hematuria                           If not 100% better by January repeat EGD and                            dilation likely                           - Resume Eliquis (apixaban) at prior dose tomorrow. Gatha Mayer, MD 02/16/2017 10:45:04 AM This report has been signed electronically.

## 2017-02-16 NOTE — Progress Notes (Signed)
Pt's states no medical or surgical changes since previsit or office visit. 

## 2017-02-17 ENCOUNTER — Telehealth: Payer: Self-pay | Admitting: *Deleted

## 2017-02-17 NOTE — Telephone Encounter (Signed)
  Follow up Call-  Call back number 02/16/2017  Post procedure Call Back phone  # [phone]  Permission to leave phone message Yes  Some recent data might be hidden     Patient questions:  Do you have a fever, pain , or abdominal swelling? No. Pain Score  0 *  Have you tolerated food without any problems? Yes.    Have you been able to return to your normal activities? Yes.    Do you have any questions about your discharge instructions: Diet   No. Medications  No. Follow up visit  No.  Do you have questions or concerns about your Care? No.  Actions: * If pain score is 4 or above: No action needed, pain <4.

## 2017-02-22 ENCOUNTER — Telehealth: Payer: Self-pay | Admitting: Internal Medicine

## 2017-02-22 NOTE — Telephone Encounter (Signed)
Left message on machine to call back  

## 2017-02-23 ENCOUNTER — Other Ambulatory Visit: Payer: Self-pay | Admitting: Family Medicine

## 2017-02-23 ENCOUNTER — Telehealth: Payer: Self-pay | Admitting: Internal Medicine

## 2017-02-23 NOTE — Telephone Encounter (Signed)
Sent. Schedule when possible.  Thanks.

## 2017-02-23 NOTE — Telephone Encounter (Signed)
Please schedule [facility] appointment as instructed.

## 2017-02-23 NOTE — Telephone Encounter (Signed)
Since starting on Pantoprazole after the endoscopy on 02/16/17.  He is having abdominal pain and blood in his urostomy bag.  Same side effects he had with omeprazole.  He stopped the pantoprazole on Monday.  He is asking for an alternative acid suppressing medication.

## 2017-02-23 NOTE — Telephone Encounter (Signed)
Electronic refill Last office visit 11/28/15 Last refill 11/25/16 #90 Last refill note was sent to patient to schedule physical for further refills  No upcoming appointment scheduled

## 2017-02-23 NOTE — Telephone Encounter (Signed)
See phone note from yesterday for additional details.

## 2017-02-28 MED ORDER — RANITIDINE HCL 300 MG PO CAPS: 300.0000 mg | ORAL_CAPSULE | Freq: Two times a day (BID) | ORAL | 11 refills | Status: DC

## 2017-02-28 NOTE — Telephone Encounter (Signed)
Rx Ranitidine 300 mg bid # 60 11 refill  Please have him make a f/u me next avail

## 2017-02-28 NOTE — Telephone Encounter (Signed)
Patient notified rx sent 

## 2017-03-01 NOTE — Telephone Encounter (Signed)
Scheduled as instructed.

## 2017-04-18 ENCOUNTER — Ambulatory Visit: Payer: Medicare HMO

## 2017-04-21 ENCOUNTER — Ambulatory Visit (INDEPENDENT_AMBULATORY_CARE_PROVIDER_SITE_OTHER): Payer: Medicare HMO

## 2017-04-21 VITALS — BP 124/90 | HR 83 | Temp 97.7°F | Ht 70.5 in | Wt 164.8 lb

## 2017-04-21 DIAGNOSIS — E785 Hyperlipidemia, unspecified: Secondary | ICD-10-CM

## 2017-04-21 DIAGNOSIS — E790 Hyperuricemia without signs of inflammatory arthritis and tophaceous disease: Secondary | ICD-10-CM

## 2017-04-21 DIAGNOSIS — Z0000 Encounter for general adult medical examination without abnormal findings: Secondary | ICD-10-CM

## 2017-04-21 DIAGNOSIS — L989 Disorder of the skin and subcutaneous tissue, unspecified: Secondary | ICD-10-CM

## 2017-04-21 DIAGNOSIS — Z862 Personal history of diseases of the blood and blood-forming organs and certain disorders involving the immune mechanism: Secondary | ICD-10-CM | POA: Diagnosis not present

## 2017-04-21 LAB — COMPREHENSIVE METABOLIC PANEL
ALBUMIN: 4.6 g/dL (ref 3.5–5.2)
ALK PHOS: 110 U/L (ref 39–117)
ALT: 11 U/L (ref 0–53)
AST: 21 U/L (ref 0–37)
BILIRUBIN TOTAL: 0.6 mg/dL (ref 0.2–1.2)
BUN: 34 mg/dL — ABNORMAL HIGH (ref 6–23)
CALCIUM: 9.9 mg/dL (ref 8.4–10.5)
CO2: 28 mEq/L (ref 19–32)
CREATININE: 2.4 mg/dL — AB (ref 0.40–1.50)
Chloride: 100 mEq/L (ref 96–112)
GFR: 27.55 mL/min — ABNORMAL LOW (ref >60.00)
Glucose, Bld: 105 mg/dL — ABNORMAL HIGH (ref 70–99)
Potassium: 4.8 mEq/L (ref 3.5–5.1)
Sodium: 136 mEq/L (ref 135–145)
Total Protein: 8 g/dL (ref 6.0–8.3)

## 2017-04-21 LAB — CBC WITH DIFFERENTIAL/PLATELET
BASOS ABS: 0 10*3/uL (ref 0.0–0.1)
BASOS PCT: 0.4 % (ref 0.0–3.0)
EOS ABS: 0.3 10*3/uL (ref 0.0–0.7)
Eosinophils Relative: 3.6 % (ref 0.0–5.0)
HEMATOCRIT: 46.3 % (ref 39.0–52.0)
HEMOGLOBIN: 15 g/dL (ref 13.0–17.0)
LYMPHS PCT: 24.6 % (ref 12.0–46.0)
Lymphs Abs: 2.1 10*3/uL (ref 0.7–4.0)
MCHC: 32.3 g/dL (ref 30.0–36.0)
MCV: 92.7 fl (ref 78.0–100.0)
MONOS PCT: 6.5 % (ref 3.0–12.0)
Monocytes Absolute: 0.6 10*3/uL (ref 0.1–1.0)
Neutro Abs: 5.5 10*3/uL (ref 1.4–7.7)
Neutrophils Relative %: 64.9 % (ref 43.0–77.0)
Platelets: 285 10*3/uL (ref 150.0–400.0)
RBC: 5 Mil/uL (ref 4.22–5.81)
RDW: 13.4 % (ref 11.5–15.5)
WBC: 8.5 10*3/uL (ref 4.0–10.5)

## 2017-04-21 LAB — LIPID PANEL
CHOL/HDL RATIO: 3
Cholesterol: 161 mg/dL (ref 0–200)
HDL: 47.8 mg/dL (ref >39.00)
NonHDL: 113.4
Triglycerides: 222 mg/dL — ABNORMAL HIGH (ref 0.0–149.0)
VLDL: 44.4 mg/dL — AB (ref 0.0–40.0)

## 2017-04-21 LAB — LDL CHOLESTEROL, DIRECT: LDL DIRECT: 78 mg/dL

## 2017-04-21 LAB — IBC PANEL
Iron: 110 ug/dL (ref 42–165)
SATURATION RATIOS: 25.2 % (ref 20.0–50.0)
Transferrin: 312 mg/dL (ref 212.0–360.0)

## 2017-04-21 LAB — URIC ACID: URIC ACID, SERUM: 7.6 mg/dL (ref 4.0–7.8)
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
## 2017-04-21 NOTE — Progress Notes (Signed)
Pre visit review using our clinic review tool, if applicable. No additional management support is needed unless otherwise documented below in the visit note. 

## 2017-04-21 NOTE — Patient Instructions (Addendum)
[name] , Thank you for taking time to come for your Medicare Wellness Visit. I appreciate your ongoing commitment to your health goals. Please review the following plan we discussed and let me know if I can assist you in the future.   These are the goals we discussed: Goals    . Follow up with Primary Care Provider     Starting 04/21/2017, I will continue to take medications as prescribed and to follow up with PCP as scheduled.        This is a list of the screening recommended for you and due dates:  Health Maintenance  Topic Date Due  . Tetanus Vaccine  01/21/2020  . Flu Shot  Completed  . Pneumonia vaccines  Completed   Preventive Care for Adults  A healthy lifestyle and preventive care can promote health and wellness. Preventive health guidelines for adults include the following key practices.  . A routine yearly physical is a good way to check with your health care provider about your health and preventive screening. It is a chance to share any concerns and updates on your health and to receive a thorough exam.  . Visit your dentist for a routine exam and preventive care every 6 months. Brush your teeth twice a day and floss once a day. Good oral hygiene prevents tooth decay and gum disease.  . The frequency of eye exams is based on your age, health, family medical history, use  of contact lenses, and other factors. Follow your health care provider's recommendations for frequency of eye exams.  . Eat a healthy diet. Foods like vegetables, fruits, whole grains, low-fat dairy products, and lean protein foods contain the nutrients you need without too many calories. Decrease your intake of foods high in solid fats, added sugars, and salt. Eat the right amount of calories for you. Get information about a proper diet from your health care provider, if necessary.  . Regular physical exercise is one of the most important things you can do for your health. Most adults should get at  least 150 minutes of moderate-intensity exercise (any activity that increases your heart rate and causes you to sweat) each week. In addition, most adults need muscle-strengthening exercises on 2 or more days a week.  Silver Sneakers may be a benefit available to you. To determine eligibility, you may visit the website: www.silversneakers.com or contact program at 386-705-4526 Mon-Fri between 8AM-8PM.   . Maintain a healthy weight. The body mass index (BMI) is a screening tool to identify possible weight problems. It provides an estimate of body fat based on height and weight. Your health care provider can find your BMI and can help you achieve or maintain a healthy weight.   For adults 20 years and older: ? A BMI below 18.5 is considered underweight. ? A BMI of 18.5 to 24.9 is normal. ? A BMI of 25 to 29.9 is considered overweight. ? A BMI of 30 and above is considered obese.   . Maintain normal blood lipids and cholesterol levels by exercising and minimizing your intake of saturated fat. Eat a balanced diet with plenty of fruit and vegetables. Blood tests for lipids and cholesterol should begin at age 68 and be repeated every 5 years. If your lipid or cholesterol levels are high, you are over 50, or you are at high risk for heart disease, you may need your cholesterol levels checked more frequently. Ongoing high lipid and cholesterol levels should be treated with medicines if  diet and exercise are not working.  . If you smoke, find out from your health care provider how to quit. If you do not use tobacco, please do not start.  . If you choose to drink alcohol, please do not consume more than 2 drinks per day. One drink is considered to be 12 ounces (355 mL) of beer, 5 ounces (148 mL) of wine, or 1.5 ounces (44 mL) of liquor.  . If you are 41-18 years old, ask your health care provider if you should take aspirin to prevent strokes.  . Use sunscreen. Apply sunscreen liberally and repeatedly  throughout the day. You should seek shade when your shadow is shorter than you. Protect yourself by wearing long sleeves, pants, a wide-brimmed hat, and sunglasses year round, whenever you are outdoors.  . Once a month, do a whole body skin exam, using a mirror to look at the skin on your back. Tell your health care provider of new moles, moles that have irregular borders, moles that are larger than a pencil eraser, or moles that have changed in shape or color.

## 2017-04-21 NOTE — Progress Notes (Signed)
PCP notes:   Health maintenance:  No gaps identified.  Abnormal screenings:   Hearing - failed  Hearing Screening   125Hz  250Hz  500Hz  1000Hz  2000Hz  3000Hz  4000Hz  6000Hz  8000Hz   Right ear:   40 0 0  0    Left ear:   0 0 0  0     Patient concerns:   Patient has three skin lesions present to left jaw and neck region. Patient reports largest legion has changed in appearance in past two months and has become painful with intermittent drainage. Patient has bandage applied to this lesion and to the one on his left jaw. PCP notified. PCP assessed lesions and referred patient to dermatology for evaluation.   Nurse concerns:  None  Next PCP appt:   04/25/17 @ 0845  I reviewed health advisor's note, was available for consultation on the day of service listed in this note, and agree with documentation and plan. Concerned about the lesions described above d/w pt, with possible superficial skin cancer, refer to dermatology.  Order placed. [name], MD.

## 2017-04-22 ENCOUNTER — Other Ambulatory Visit: Payer: Self-pay | Admitting: *Deleted

## 2017-04-22 DIAGNOSIS — I6523 Occlusion and stenosis of bilateral carotid arteries: Secondary | ICD-10-CM

## 2017-04-25 ENCOUNTER — Ambulatory Visit (INDEPENDENT_AMBULATORY_CARE_PROVIDER_SITE_OTHER): Payer: Medicare HMO | Admitting: Family Medicine

## 2017-04-25 ENCOUNTER — Encounter: Payer: Self-pay | Admitting: Family Medicine

## 2017-04-25 VITALS — BP 124/90 | HR 83 | Temp 97.7°F | Ht 70.5 in | Wt 164.8 lb

## 2017-04-25 DIAGNOSIS — C679 Malignant neoplasm of bladder, unspecified: Secondary | ICD-10-CM

## 2017-04-25 DIAGNOSIS — N183 Chronic kidney disease, stage 3 unspecified: Secondary | ICD-10-CM

## 2017-04-25 DIAGNOSIS — L989 Disorder of the skin and subcutaneous tissue, unspecified: Secondary | ICD-10-CM | POA: Diagnosis not present

## 2017-04-25 DIAGNOSIS — I480 Paroxysmal atrial fibrillation: Secondary | ICD-10-CM | POA: Diagnosis not present

## 2017-04-25 DIAGNOSIS — I10 Essential (primary) hypertension: Secondary | ICD-10-CM | POA: Diagnosis not present

## 2017-04-25 DIAGNOSIS — Z7189 Other specified counseling: Secondary | ICD-10-CM

## 2017-04-25 DIAGNOSIS — E785 Hyperlipidemia, unspecified: Secondary | ICD-10-CM

## 2017-04-25 LAB — BASIC METABOLIC PANEL
BUN: 34 mg/dL — AB (ref 6–23)
CALCIUM: 10 mg/dL (ref 8.4–10.5)
CO2: 27 mEq/L (ref 19–32)
Chloride: 100 mEq/L (ref 96–112)
Creatinine, Ser: 2.57 mg/dL — ABNORMAL HIGH (ref 0.40–1.50)
GFR: 25.46 mL/min — AB (ref >60.00)
Glucose, Bld: 81 mg/dL (ref 70–99)
POTASSIUM: 5.2 meq/L — AB (ref 3.5–5.1)
SODIUM: 135 meq/L (ref 135–145)

## 2017-04-25 MED ORDER — RANITIDINE HCL 300 MG PO CAPS: 300.0000 mg | ORAL_CAPSULE | Freq: Every day | ORAL | Status: DC

## 2017-04-25 MED ORDER — FLUTICASONE PROPIONATE 50 MCG/ACT NA SUSP: 2.0000 | Freq: Every day | NASAL | Status: AC | PRN

## 2017-04-25 MED ORDER — ALLOPURINOL 100 MG PO TABS: 100.0000 mg | ORAL_TABLET | ORAL | Status: DC

## 2017-04-25 MED ORDER — FEXOFENADINE HCL 180 MG PO TABS: 180.0000 mg | ORAL_TABLET | Freq: Every day | ORAL | Status: AC | PRN

## 2017-04-25 NOTE — Progress Notes (Signed)
Skin lesions near the B ears, with derm eval pending.  D/w pt.   Referral prev done.    He had blood in his urine with PPI use but then it resolved when he stopped PPI. No blood in urine now per patient report.  His swallowing is better in the meantime after prev dysphagia episode. No sx now or recently.  On zantac w/o ADE.   Elevated Cholesterol: Using medications without problems:yes Muscle aches: no Diet compliance:yes Exercise:mainly housework, d/w pt.  He is doing the best he can to be active.   CKD.  With baseline elevation in Cr with prev surgery hx noted, d/w pt. Due for recheck Cr today.  D/w pt.  Advised to avoid nsaids and stay well hydrated.    Gout. On allopurinol every other day.  No ADE on med. No recent flares of gout.    Hypertension:    Using medication without problems or lightheadedness: yes Chest pain with exertion:no Edema:no Short of breath:no  Both sons designated if patient were incapacitated.   He is still caring for his wife who has sig memory troubles.  He is managing a lot on the home front, helping her, cooking, cleaning, etc.  He is getting by.  D/w pt.  He didn't need external help at this point.  His sons are helping with yardwork.    PMH and SH reviewed  Meds, vitals, and allergies reviewed.   ROS: Per HPI unless specifically indicated in ROS section   GEN: nad, alert and oriented HEENT: mucous membranes moist NECK: supple w/o LA CV: IRR PULM: ctab, no inc wob ABD: soft, +bs EXT: no edema SKIN: Scabbed lesions noted near the B ears, on the neck.

## 2017-04-25 NOTE — Patient Instructions (Addendum)
Don't take ibuprofen or aleve.  Tylenol is okay.  Go to the lab on the way out.  We'll contact you with your lab report. Take care.  Glad to see you.  Update me as needed.

## 2017-04-26 NOTE — Assessment & Plan Note (Signed)
Both sons designated if patient were incapacitated.

## 2017-04-28 ENCOUNTER — Telehealth: Payer: Self-pay | Admitting: Family Medicine

## 2017-04-28 MED ORDER — APIXABAN 2.5 MG PO TABS: 2.5000 mg | ORAL_TABLET | Freq: Two times a day (BID) | ORAL | 5 refills | Status: DC

## 2017-04-28 NOTE — Telephone Encounter (Signed)
Patient advised.

## 2017-04-28 NOTE — Assessment & Plan Note (Signed)
Reasonable control, prev labs d/w pt.  No change in meds.  He agrees.

## 2017-04-28 NOTE — Assessment & Plan Note (Signed)
Referral to derm pending.

## 2017-04-28 NOTE — Assessment & Plan Note (Signed)
H/o, per uro.  See CKD discussion.

## 2017-04-28 NOTE — Telephone Encounter (Signed)
I didn't mention this on prev lab results but given his Cr and age, it is reasonable to decrease his eliquis to 2.5mg  BID.  New rx sent.  He may not be able to cut the 5mg  tabs in half.  He'll have to check with pharmacy about that.   Thanks.

## 2017-04-28 NOTE — Assessment & Plan Note (Signed)
Continue statin, labs d/w pt.  Able to tolerate statin dose as is.

## 2017-04-28 NOTE — Assessment & Plan Note (Signed)
His creatinine didn't come back down- similar to prev at 2.4--2.5 now. The concern is for stricture in/near the bladder from prev surgery. He needs f/u with urology. I talked to Dr. Tresa Moore on the phone. We'll ask for patient to f/u with Dr. Tresa Moore.  I thank all involved. He doesn't appear to need urgent intervention by renal.  See notes on labs.  >25 minutes spent in face to face time with patient, >50% spent in counselling or coordination of care.

## 2017-04-28 NOTE — Assessment & Plan Note (Signed)
Sounds to be IRR but not tachy today.  No change in meds.  He is anticoagulated w/o gross blood in urine.

## 2017-05-04 DIAGNOSIS — L570 Actinic keratosis: Secondary | ICD-10-CM | POA: Diagnosis not present

## 2017-05-04 DIAGNOSIS — L821 Other seborrheic keratosis: Secondary | ICD-10-CM | POA: Diagnosis not present

## 2017-05-04 DIAGNOSIS — D485 Neoplasm of uncertain behavior of skin: Secondary | ICD-10-CM | POA: Diagnosis not present

## 2017-05-04 DIAGNOSIS — Z85828 Personal history of other malignant neoplasm of skin: Secondary | ICD-10-CM | POA: Diagnosis not present

## 2017-05-04 DIAGNOSIS — L814 Other melanin hyperpigmentation: Secondary | ICD-10-CM | POA: Diagnosis not present

## 2017-05-11 DIAGNOSIS — C4441 Basal cell carcinoma of skin of scalp and neck: Secondary | ICD-10-CM | POA: Diagnosis not present

## 2017-05-11 DIAGNOSIS — C44319 Basal cell carcinoma of skin of other parts of face: Secondary | ICD-10-CM | POA: Diagnosis not present

## 2017-05-11 DIAGNOSIS — D485 Neoplasm of uncertain behavior of skin: Secondary | ICD-10-CM | POA: Diagnosis not present

## 2017-05-11 DIAGNOSIS — C44719 Basal cell carcinoma of skin of left lower limb, including hip: Secondary | ICD-10-CM | POA: Diagnosis not present

## 2017-05-17 DIAGNOSIS — Z932 Ileostomy status: Secondary | ICD-10-CM | POA: Diagnosis not present

## 2017-05-25 DIAGNOSIS — D485 Neoplasm of uncertain behavior of skin: Secondary | ICD-10-CM | POA: Diagnosis not present

## 2017-05-25 DIAGNOSIS — C44612 Basal cell carcinoma of skin of right upper limb, including shoulder: Secondary | ICD-10-CM | POA: Diagnosis not present

## 2017-05-25 DIAGNOSIS — C44519 Basal cell carcinoma of skin of other part of trunk: Secondary | ICD-10-CM | POA: Diagnosis not present

## 2017-05-25 DIAGNOSIS — C44310 Basal cell carcinoma of skin of unspecified parts of face: Secondary | ICD-10-CM | POA: Diagnosis not present

## 2017-05-25 DIAGNOSIS — L570 Actinic keratosis: Secondary | ICD-10-CM | POA: Diagnosis not present

## 2017-05-25 DIAGNOSIS — C44719 Basal cell carcinoma of skin of left lower limb, including hip: Secondary | ICD-10-CM | POA: Diagnosis not present

## 2017-05-25 DIAGNOSIS — C44619 Basal cell carcinoma of skin of left upper limb, including shoulder: Secondary | ICD-10-CM | POA: Diagnosis not present

## 2017-05-25 DIAGNOSIS — C4441 Basal cell carcinoma of skin of scalp and neck: Secondary | ICD-10-CM | POA: Diagnosis not present

## 2017-06-01 DIAGNOSIS — C44519 Basal cell carcinoma of skin of other part of trunk: Secondary | ICD-10-CM | POA: Diagnosis not present

## 2017-06-01 DIAGNOSIS — L298 Other pruritus: Secondary | ICD-10-CM | POA: Diagnosis not present

## 2017-06-01 DIAGNOSIS — D485 Neoplasm of uncertain behavior of skin: Secondary | ICD-10-CM | POA: Diagnosis not present

## 2017-06-01 DIAGNOSIS — S21202S Unspecified open wound of left back wall of thorax without penetration into thoracic cavity, sequela: Secondary | ICD-10-CM | POA: Diagnosis not present

## 2017-06-08 DIAGNOSIS — C44519 Basal cell carcinoma of skin of other part of trunk: Secondary | ICD-10-CM | POA: Diagnosis not present

## 2017-06-08 DIAGNOSIS — D485 Neoplasm of uncertain behavior of skin: Secondary | ICD-10-CM | POA: Diagnosis not present

## 2017-06-08 DIAGNOSIS — C44612 Basal cell carcinoma of skin of right upper limb, including shoulder: Secondary | ICD-10-CM | POA: Diagnosis not present

## 2017-06-15 ENCOUNTER — Other Ambulatory Visit: Payer: Self-pay | Admitting: Cardiovascular Disease

## 2017-06-29 DIAGNOSIS — C4441 Basal cell carcinoma of skin of scalp and neck: Secondary | ICD-10-CM | POA: Diagnosis not present

## 2017-07-11 DIAGNOSIS — C4441 Basal cell carcinoma of skin of scalp and neck: Secondary | ICD-10-CM | POA: Diagnosis not present

## 2017-07-22 DIAGNOSIS — L570 Actinic keratosis: Secondary | ICD-10-CM | POA: Diagnosis not present

## 2017-07-22 DIAGNOSIS — C44519 Basal cell carcinoma of skin of other part of trunk: Secondary | ICD-10-CM | POA: Diagnosis not present

## 2017-07-26 DIAGNOSIS — C44319 Basal cell carcinoma of skin of other parts of face: Secondary | ICD-10-CM | POA: Diagnosis not present

## 2017-07-26 DIAGNOSIS — C44719 Basal cell carcinoma of skin of left lower limb, including hip: Secondary | ICD-10-CM | POA: Diagnosis not present

## 2017-07-26 DIAGNOSIS — D492 Neoplasm of unspecified behavior of bone, soft tissue, and skin: Secondary | ICD-10-CM | POA: Diagnosis not present

## 2017-07-28 IMAGING — CR DG CHEST 2V
2 series · 2 of 2 positions shown · non-contrast
Comparison: 12/13/2014

CLINICAL DATA: Urinary bladder cancer, status post cystectomy

EXAM:
CHEST  2 VIEW

[w chest pa]
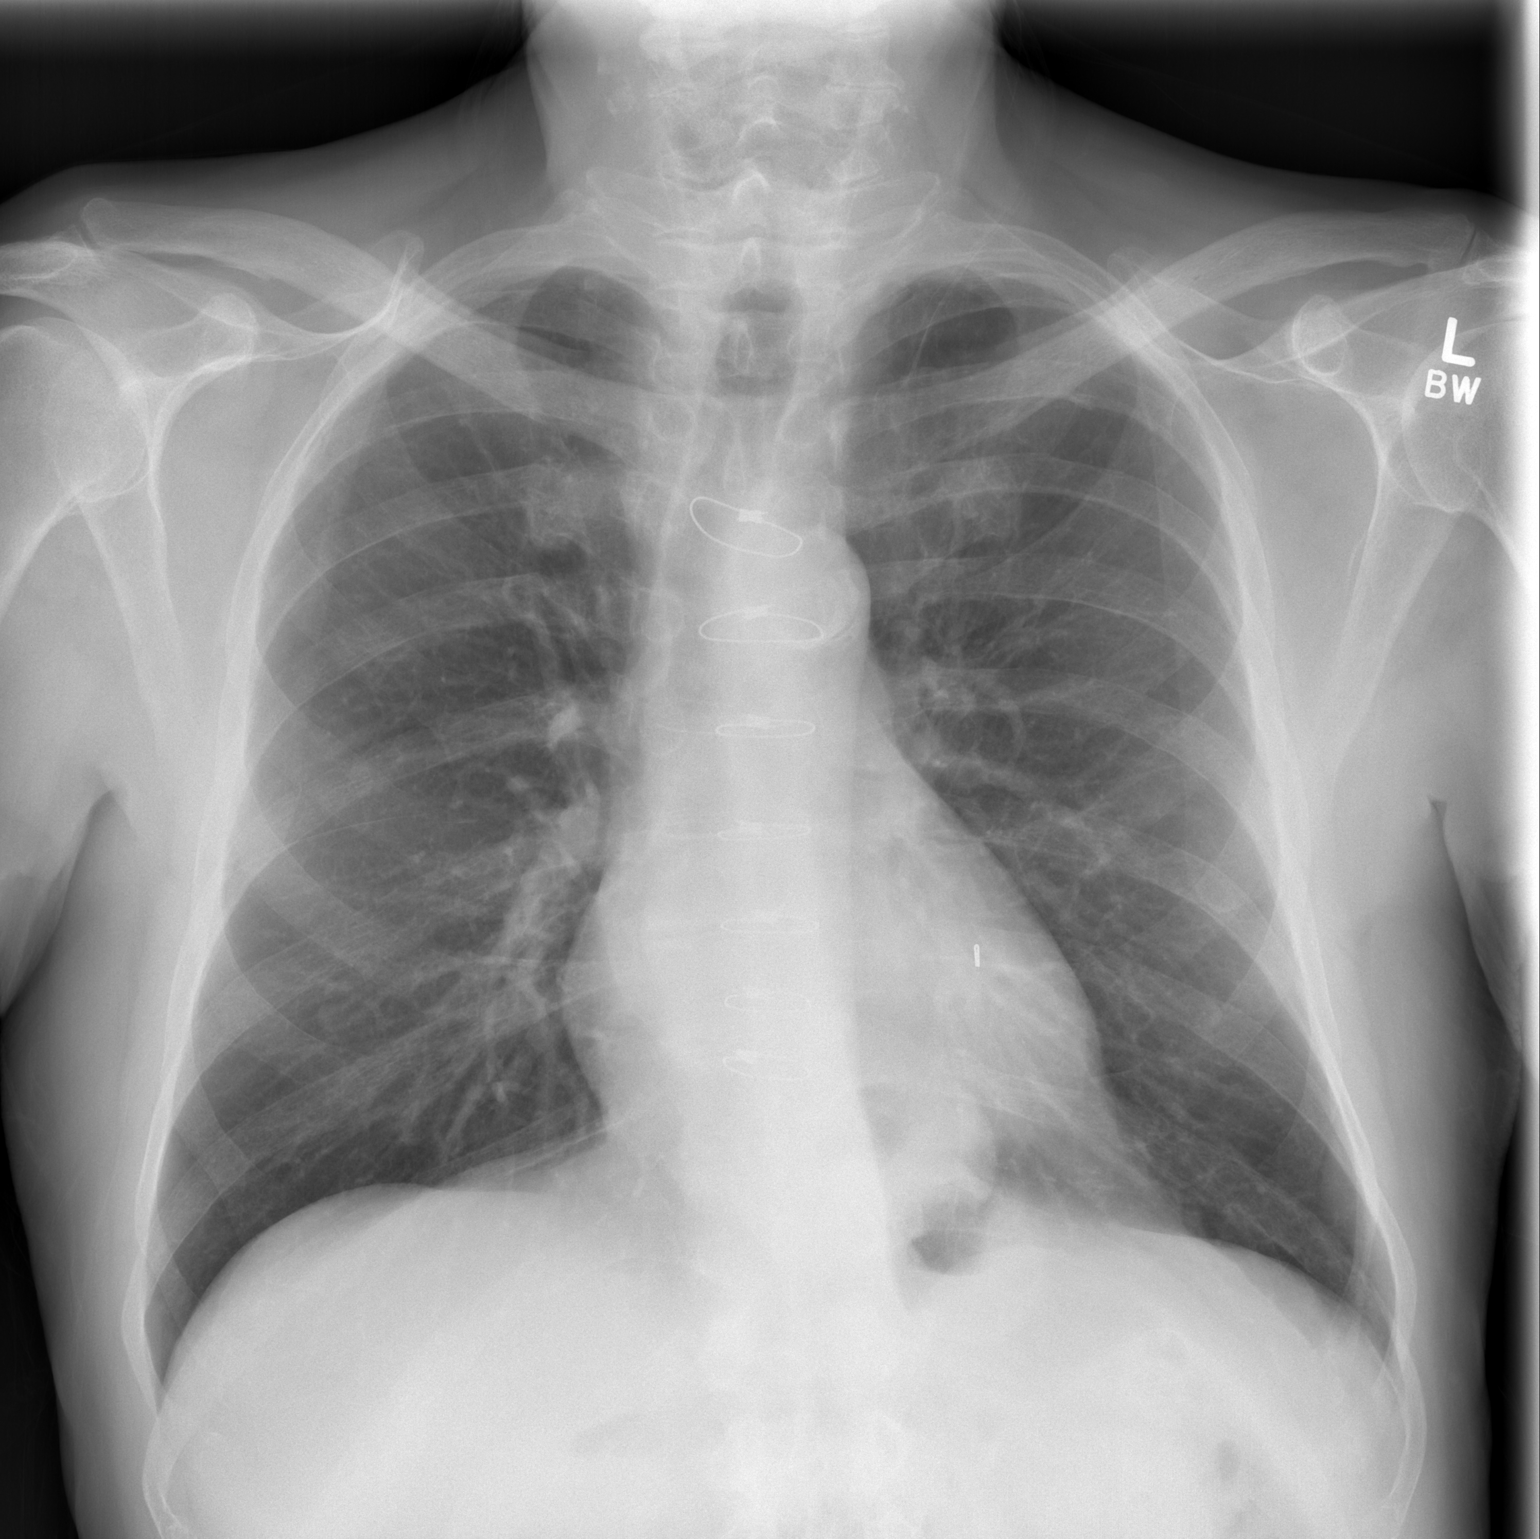

[w chest lat]
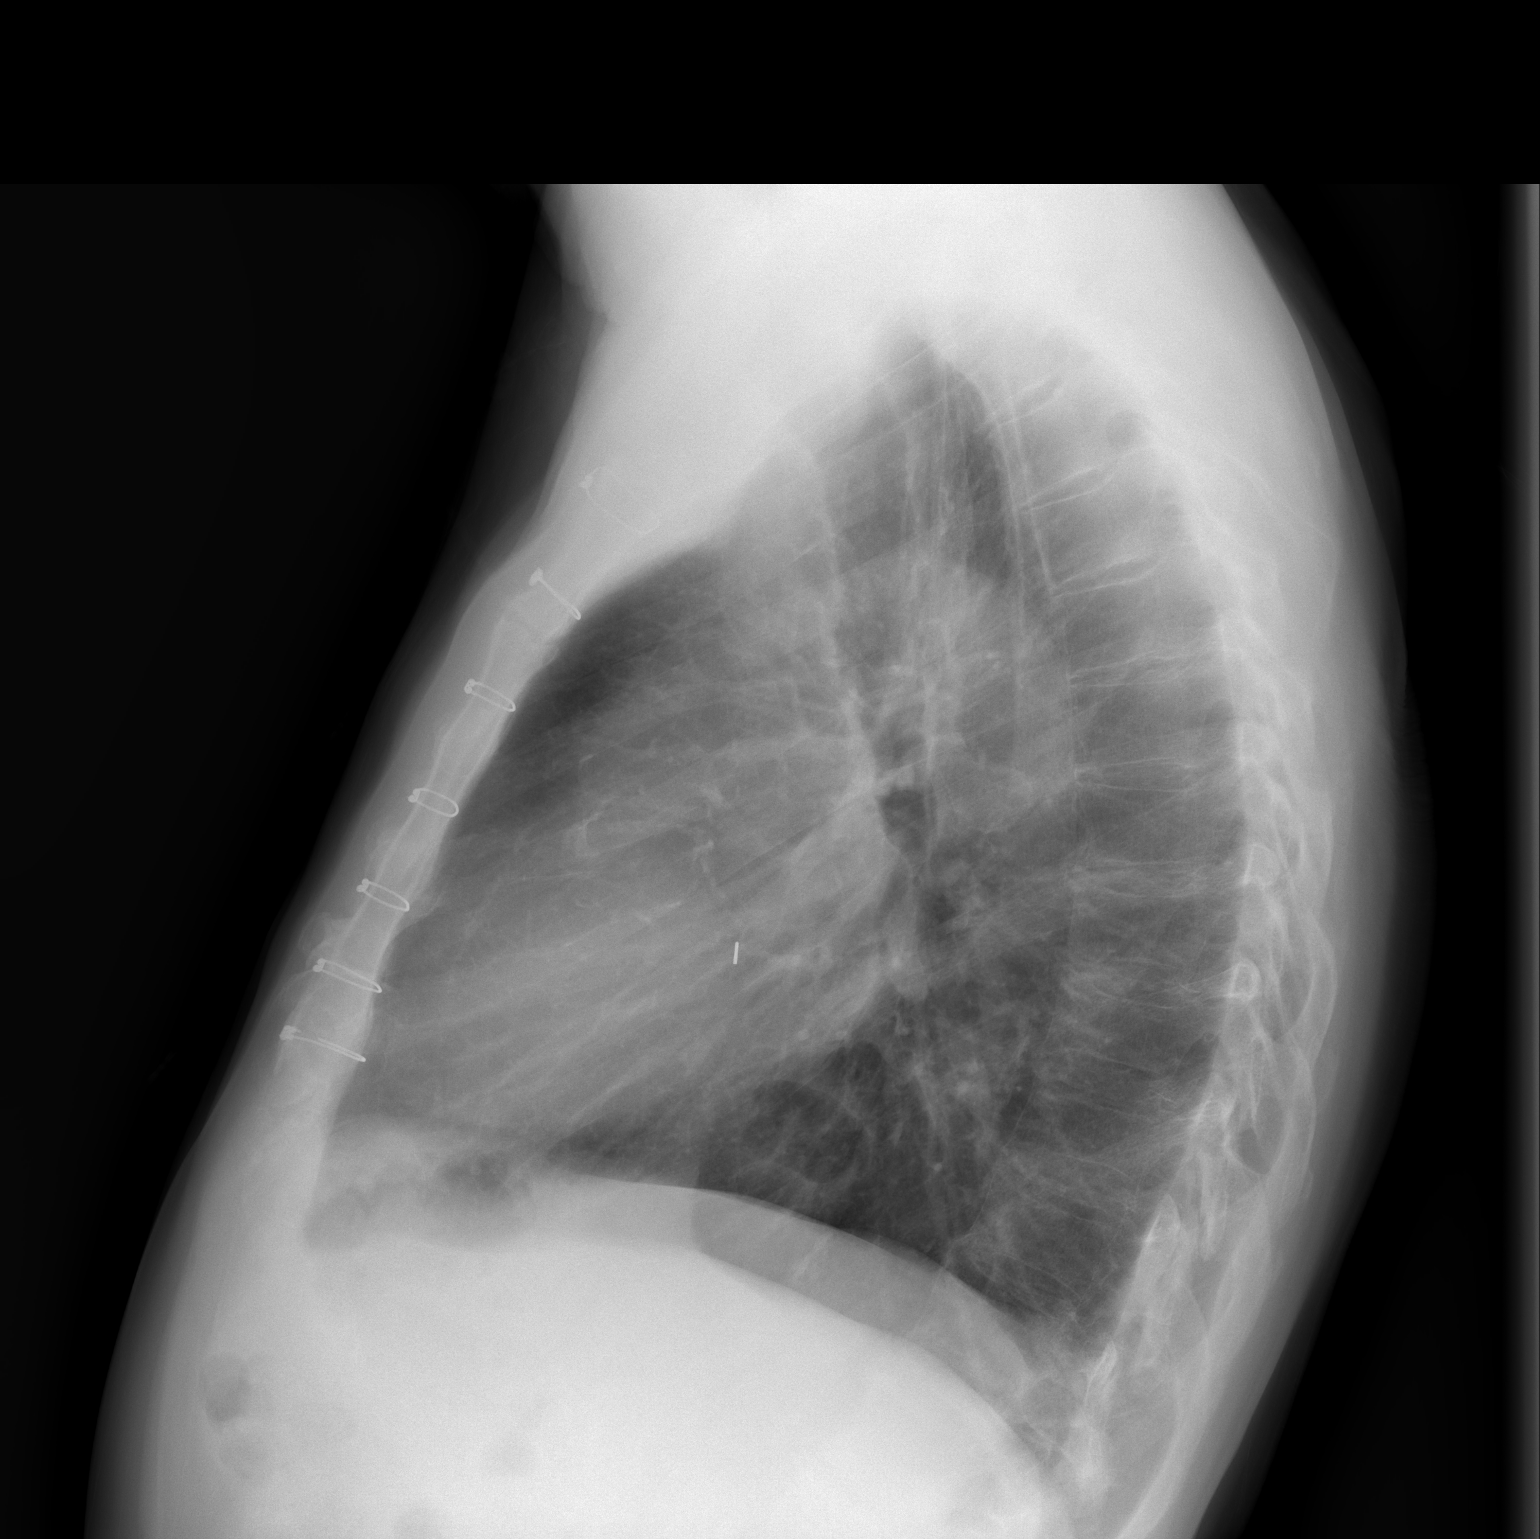

[2 of 2 positions shown; findings below may reference images not displayed]

FINDINGS: Cardiomediastinal silhouette is stable. Mild thoracic
dextroscoliosis. Status post median sternotomy. No infiltrate or
pleural effusion. No pulmonary edema. Small hiatal hernia again
noted.
IMPRESSION: No active disease.  Small hiatal hernia again noted.

## 2017-08-05 DIAGNOSIS — Z85828 Personal history of other malignant neoplasm of skin: Secondary | ICD-10-CM | POA: Diagnosis not present

## 2017-08-05 DIAGNOSIS — C44612 Basal cell carcinoma of skin of right upper limb, including shoulder: Secondary | ICD-10-CM | POA: Diagnosis not present

## 2017-08-05 DIAGNOSIS — C4441 Basal cell carcinoma of skin of scalp and neck: Secondary | ICD-10-CM | POA: Diagnosis not present

## 2017-08-05 DIAGNOSIS — D485 Neoplasm of uncertain behavior of skin: Secondary | ICD-10-CM | POA: Diagnosis not present

## 2017-08-05 DIAGNOSIS — C44319 Basal cell carcinoma of skin of other parts of face: Secondary | ICD-10-CM | POA: Diagnosis not present

## 2017-08-15 DIAGNOSIS — Z932 Ileostomy status: Secondary | ICD-10-CM | POA: Diagnosis not present

## 2017-09-09 DIAGNOSIS — L570 Actinic keratosis: Secondary | ICD-10-CM | POA: Diagnosis not present

## 2017-09-09 DIAGNOSIS — D485 Neoplasm of uncertain behavior of skin: Secondary | ICD-10-CM | POA: Diagnosis not present

## 2017-09-09 DIAGNOSIS — L929 Granulomatous disorder of the skin and subcutaneous tissue, unspecified: Secondary | ICD-10-CM | POA: Diagnosis not present

## 2017-09-12 ENCOUNTER — Telehealth: Payer: Self-pay | Admitting: Family Medicine

## 2017-09-12 NOTE — Telephone Encounter (Signed)
Copied from [place] [id number]. Topic: Quick Communication - See Telephone Encounter >> Sep 12, 2017 12:47 PM Conception Chancy, NT wrote: CRM for notification. See Telephone encounter for: 09/12/17.  Sharee Pimple is calling from [facility] and is requesting lab results faxed to her from visit 04/21/17.   Fax# [phone] Cb# [phone]

## 2017-09-12 NOTE — Telephone Encounter (Signed)
Lab results faxed to [facility] as requested.

## 2017-09-22 DIAGNOSIS — C44612 Basal cell carcinoma of skin of right upper limb, including shoulder: Secondary | ICD-10-CM | POA: Diagnosis not present

## 2017-09-28 ENCOUNTER — Ambulatory Visit (INDEPENDENT_AMBULATORY_CARE_PROVIDER_SITE_OTHER): Payer: Medicare HMO

## 2017-09-28 DIAGNOSIS — I6523 Occlusion and stenosis of bilateral carotid arteries: Secondary | ICD-10-CM

## 2017-10-04 DIAGNOSIS — C44319 Basal cell carcinoma of skin of other parts of face: Secondary | ICD-10-CM | POA: Diagnosis not present

## 2017-10-04 DIAGNOSIS — D485 Neoplasm of uncertain behavior of skin: Secondary | ICD-10-CM | POA: Diagnosis not present

## 2017-10-04 DIAGNOSIS — S81802D Unspecified open wound, left lower leg, subsequent encounter: Secondary | ICD-10-CM | POA: Diagnosis not present

## 2017-10-04 DIAGNOSIS — C4441 Basal cell carcinoma of skin of scalp and neck: Secondary | ICD-10-CM | POA: Diagnosis not present

## 2017-10-12 DIAGNOSIS — Z85828 Personal history of other malignant neoplasm of skin: Secondary | ICD-10-CM | POA: Diagnosis not present

## 2017-10-12 DIAGNOSIS — S81802D Unspecified open wound, left lower leg, subsequent encounter: Secondary | ICD-10-CM | POA: Diagnosis not present

## 2017-10-12 DIAGNOSIS — C4441 Basal cell carcinoma of skin of scalp and neck: Secondary | ICD-10-CM | POA: Diagnosis not present

## 2017-10-12 DIAGNOSIS — C44319 Basal cell carcinoma of skin of other parts of face: Secondary | ICD-10-CM | POA: Diagnosis not present

## 2017-11-15 DIAGNOSIS — Z932 Ileostomy status: Secondary | ICD-10-CM | POA: Diagnosis not present

## 2017-11-16 DIAGNOSIS — L821 Other seborrheic keratosis: Secondary | ICD-10-CM | POA: Diagnosis not present

## 2017-11-16 DIAGNOSIS — D485 Neoplasm of uncertain behavior of skin: Secondary | ICD-10-CM | POA: Diagnosis not present

## 2017-11-16 DIAGNOSIS — Z1283 Encounter for screening for malignant neoplasm of skin: Secondary | ICD-10-CM | POA: Diagnosis not present

## 2017-11-16 DIAGNOSIS — S80869S Insect bite (nonvenomous), unspecified lower leg, sequela: Secondary | ICD-10-CM | POA: Diagnosis not present

## 2017-11-16 DIAGNOSIS — D225 Melanocytic nevi of trunk: Secondary | ICD-10-CM | POA: Diagnosis not present

## 2017-11-16 DIAGNOSIS — C44319 Basal cell carcinoma of skin of other parts of face: Secondary | ICD-10-CM | POA: Diagnosis not present

## 2017-12-08 ENCOUNTER — Other Ambulatory Visit: Payer: Self-pay | Admitting: Cardiovascular Disease

## 2017-12-15 ENCOUNTER — Other Ambulatory Visit: Payer: Self-pay | Admitting: Cardiovascular Disease

## 2017-12-15 NOTE — Telephone Encounter (Signed)
Please review for refill, Thanks !  

## 2017-12-16 MED ORDER — APIXABAN 2.5 MG PO TABS: 2.5000 mg | ORAL_TABLET | Freq: Two times a day (BID) | ORAL | 5 refills | Status: DC

## 2017-12-16 NOTE — Telephone Encounter (Signed)
RX sent for appropriate dosage of 2.5mg  BID (age>80 and Scr>1.5).

## 2018-01-19 DIAGNOSIS — C44712 Basal cell carcinoma of skin of right lower limb, including hip: Secondary | ICD-10-CM | POA: Diagnosis not present

## 2018-01-19 DIAGNOSIS — Z85828 Personal history of other malignant neoplasm of skin: Secondary | ICD-10-CM | POA: Diagnosis not present

## 2018-01-19 DIAGNOSIS — C44619 Basal cell carcinoma of skin of left upper limb, including shoulder: Secondary | ICD-10-CM | POA: Diagnosis not present

## 2018-01-19 DIAGNOSIS — C44319 Basal cell carcinoma of skin of other parts of face: Secondary | ICD-10-CM | POA: Diagnosis not present

## 2018-01-19 DIAGNOSIS — L570 Actinic keratosis: Secondary | ICD-10-CM | POA: Diagnosis not present

## 2018-01-19 DIAGNOSIS — C44519 Basal cell carcinoma of skin of other part of trunk: Secondary | ICD-10-CM | POA: Diagnosis not present

## 2018-01-19 DIAGNOSIS — D485 Neoplasm of uncertain behavior of skin: Secondary | ICD-10-CM | POA: Diagnosis not present

## 2018-01-19 DIAGNOSIS — C44719 Basal cell carcinoma of skin of left lower limb, including hip: Secondary | ICD-10-CM | POA: Diagnosis not present

## 2018-01-19 DIAGNOSIS — L814 Other melanin hyperpigmentation: Secondary | ICD-10-CM | POA: Diagnosis not present

## 2018-01-19 DIAGNOSIS — L720 Epidermal cyst: Secondary | ICD-10-CM | POA: Diagnosis not present

## 2018-01-19 DIAGNOSIS — Z1283 Encounter for screening for malignant neoplasm of skin: Secondary | ICD-10-CM | POA: Diagnosis not present

## 2018-01-27 ENCOUNTER — Other Ambulatory Visit: Payer: Self-pay | Admitting: Cardiovascular Disease

## 2018-02-07 ENCOUNTER — Telehealth: Payer: Self-pay | Admitting: Internal Medicine

## 2018-02-07 MED ORDER — FAMOTIDINE 40 MG PO TABS: 40.0000 mg | ORAL_TABLET | Freq: Every day | ORAL | 5 refills | Status: DC

## 2018-02-07 NOTE — Telephone Encounter (Signed)
[name] informed of the change to Pepcid 40mg  daily and I confirmed his pharmacy. He just wants to try 30 days worth for starters.

## 2018-02-13 ENCOUNTER — Telehealth: Payer: Self-pay

## 2018-02-13 DIAGNOSIS — Z932 Ileostomy status: Secondary | ICD-10-CM | POA: Diagnosis not present

## 2018-02-13 NOTE — Telephone Encounter (Signed)
Pharmacy sent a fax letting us know that patients ranitidine 300mg  has been recalled. I notified [name] and sent in Pepcid 40mg  BID to replace the ranitidine.

## 2018-03-05 ENCOUNTER — Other Ambulatory Visit: Payer: Self-pay | Admitting: Cardiovascular Disease

## 2018-05-02 ENCOUNTER — Ambulatory Visit (INDEPENDENT_AMBULATORY_CARE_PROVIDER_SITE_OTHER): Payer: Medicare HMO

## 2018-05-02 ENCOUNTER — Ambulatory Visit: Payer: Medicare HMO

## 2018-05-02 ENCOUNTER — Other Ambulatory Visit: Payer: Self-pay | Admitting: Family Medicine

## 2018-05-02 VITALS — BP 130/82 | HR 73 | Temp 97.7°F | Ht 71.5 in

## 2018-05-02 DIAGNOSIS — E785 Hyperlipidemia, unspecified: Secondary | ICD-10-CM | POA: Diagnosis not present

## 2018-05-02 DIAGNOSIS — Z23 Encounter for immunization: Secondary | ICD-10-CM

## 2018-05-02 DIAGNOSIS — Z0000 Encounter for general adult medical examination without abnormal findings: Secondary | ICD-10-CM

## 2018-05-02 DIAGNOSIS — E790 Hyperuricemia without signs of inflammatory arthritis and tophaceous disease: Secondary | ICD-10-CM | POA: Diagnosis not present

## 2018-05-02 LAB — CBC WITH DIFFERENTIAL/PLATELET
BASOS ABS: 0.1 10*3/uL (ref 0.0–0.1)
Basophils Relative: 1.2 % (ref 0.0–3.0)
EOS ABS: 0.3 10*3/uL (ref 0.0–0.7)
Eosinophils Relative: 3.1 % (ref 0.0–5.0)
HCT: 45.6 % (ref 39.0–52.0)
Hemoglobin: 14.7 g/dL (ref 13.0–17.0)
Lymphocytes Relative: 19.6 % (ref 12.0–46.0)
Lymphs Abs: 1.6 10*3/uL (ref 0.7–4.0)
MCHC: 32.3 g/dL (ref 30.0–36.0)
MCV: 95.2 fl (ref 78.0–100.0)
Monocytes Absolute: 0.8 10*3/uL (ref 0.1–1.0)
Monocytes Relative: 9.7 % (ref 3.0–12.0)
Neutro Abs: 5.6 10*3/uL (ref 1.4–7.7)
Neutrophils Relative %: 66.4 % (ref 43.0–77.0)
Platelets: 286 10*3/uL (ref 150.0–400.0)
RBC: 4.79 Mil/uL (ref 4.22–5.81)
RDW: 14.5 % (ref 11.5–15.5)
WBC: 8.4 10*3/uL (ref 4.0–10.5)

## 2018-05-02 LAB — LIPID PANEL
Cholesterol: 177 mg/dL (ref 0–200)
HDL: 70 mg/dL (ref >39.00)
LDL CALC: 81 mg/dL (ref 0–99)
NonHDL: 107.41
Total CHOL/HDL Ratio: 3
Triglycerides: 130 mg/dL (ref 0.0–149.0)
VLDL: 26 mg/dL (ref 0.0–40.0)

## 2018-05-02 LAB — COMPREHENSIVE METABOLIC PANEL
ALT: 9 U/L (ref 0–53)
AST: 21 U/L (ref 0–37)
Albumin: 4.4 g/dL (ref 3.5–5.2)
Alkaline Phosphatase: 101 U/L (ref 39–117)
BUN: 41 mg/dL — ABNORMAL HIGH (ref 6–23)
CO2: 27 meq/L (ref 19–32)
Calcium: 10.2 mg/dL (ref 8.4–10.5)
Chloride: 99 mEq/L (ref 96–112)
Creatinine, Ser: 2.2 mg/dL — ABNORMAL HIGH (ref 0.40–1.50)
GFR: 28.59 mL/min — ABNORMAL LOW (ref >60.00)
Glucose, Bld: 96 mg/dL (ref 70–99)
Potassium: 5.1 mEq/L (ref 3.5–5.1)
Sodium: 136 mEq/L (ref 135–145)
Total Bilirubin: 0.5 mg/dL (ref 0.2–1.2)
Total Protein: 7.9 g/dL (ref 6.0–8.3)

## 2018-05-02 LAB — URIC ACID: Uric Acid, Serum: 6.6 mg/dL (ref 4.0–7.8)
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
## 2018-05-02 NOTE — Patient Instructions (Signed)
[name] , Thank you for taking time to come for your Medicare Wellness Visit. I appreciate your ongoing commitment to your health goals. Please review the following plan we discussed and let me know if I can assist you in the future.   These are the goals we discussed: Goals    . Follow up with Primary Care Provider     Starting 05/02/2018, I will continue to take medications as prescribed and to follow up with PCP as scheduled.        This is a list of the screening recommended for you and due dates:  Health Maintenance  Topic Date Due  . Tetanus Vaccine  01/21/2020  . Flu Shot  Completed  . Pneumonia vaccines  Completed   Preventive Care for Adults  A healthy lifestyle and preventive care can promote health and wellness. Preventive health guidelines for adults include the following key practices.  . A routine yearly physical is a good way to check with your health care provider about your health and preventive screening. It is a chance to share any concerns and updates on your health and to receive a thorough exam.  . Visit your dentist for a routine exam and preventive care every 6 months. Brush your teeth twice a day and floss once a day. Good oral hygiene prevents tooth decay and gum disease.  . The frequency of eye exams is based on your age, health, family medical history, use  of contact lenses, and other factors. Follow your health care provider's recommendations for frequency of eye exams.  . Eat a healthy diet. Foods like vegetables, fruits, whole grains, low-fat dairy products, and lean protein foods contain the nutrients you need without too many calories. Decrease your intake of foods high in solid fats, added sugars, and salt. Eat the right amount of calories for you. Get information about a proper diet from your health care provider, if necessary.  . Regular physical exercise is one of the most important things you can do for your health. Most adults should get at  least 150 minutes of moderate-intensity exercise (any activity that increases your heart rate and causes you to sweat) each week. In addition, most adults need muscle-strengthening exercises on 2 or more days a week.  Silver Sneakers may be a benefit available to you. To determine eligibility, you may visit the website: www.silversneakers.com or contact program at (601)541-1020 Mon-Fri between 8AM-8PM.   . Maintain a healthy weight. The body mass index (BMI) is a screening tool to identify possible weight problems. It provides an estimate of body fat based on height and weight. Your health care provider can find your BMI and can help you achieve or maintain a healthy weight.   For adults 20 years and older: ? A BMI below 18.5 is considered underweight. ? A BMI of 18.5 to 24.9 is normal. ? A BMI of 25 to 29.9 is considered overweight. ? A BMI of 30 and above is considered obese.   . Maintain normal blood lipids and cholesterol levels by exercising and minimizing your intake of saturated fat. Eat a balanced diet with plenty of fruit and vegetables. Blood tests for lipids and cholesterol should begin at age 69 and be repeated every 5 years. If your lipid or cholesterol levels are high, you are over 50, or you are at high risk for heart disease, you may need your cholesterol levels checked more frequently. Ongoing high lipid and cholesterol levels should be treated with medicines if  diet and exercise are not working.  . If you smoke, find out from your health care provider how to quit. If you do not use tobacco, please do not start.  . If you choose to drink alcohol, please do not consume more than 2 drinks per day. One drink is considered to be 12 ounces (355 mL) of beer, 5 ounces (148 mL) of wine, or 1.5 ounces (44 mL) of liquor.  . If you are 67-46 years old, ask your health care provider if you should take aspirin to prevent strokes.  . Use sunscreen. Apply sunscreen liberally and repeatedly  throughout the day. You should seek shade when your shadow is shorter than you. Protect yourself by wearing long sleeves, pants, a wide-brimmed hat, and sunglasses year round, whenever you are outdoors.  . Once a month, do a whole body skin exam, using a mirror to look at the skin on your back. Tell your health care provider of new moles, moles that have irregular borders, moles that are larger than a pencil eraser, or moles that have changed in shape or color.

## 2018-05-02 NOTE — Progress Notes (Signed)
PCP notes:   Health maintenance:  Flu vaccine - administered  Abnormal screenings:   Hearing - failed  Hearing Screening   125Hz  250Hz  500Hz  1000Hz  2000Hz  3000Hz  4000Hz  6000Hz  8000Hz   Right ear:   40 0 0  0    Left ear:   0 0 0  0     Patient concerns:   None  Nurse concerns:  None  Next PCP appt:   05/05/18 @ 0845  I reviewed health advisor's note, was available for consultation on the day of service listed in this note, and agree with documentation and plan. [name], MD.

## 2018-05-05 ENCOUNTER — Ambulatory Visit (INDEPENDENT_AMBULATORY_CARE_PROVIDER_SITE_OTHER): Payer: Medicare HMO | Admitting: Family Medicine

## 2018-05-05 ENCOUNTER — Encounter: Payer: Self-pay | Admitting: Family Medicine

## 2018-05-05 VITALS — BP 150/80 | HR 67 | Temp 97.5°F | Ht 71.5 in

## 2018-05-05 DIAGNOSIS — C679 Malignant neoplasm of bladder, unspecified: Secondary | ICD-10-CM

## 2018-05-05 DIAGNOSIS — I6523 Occlusion and stenosis of bilateral carotid arteries: Secondary | ICD-10-CM | POA: Diagnosis not present

## 2018-05-05 DIAGNOSIS — N183 Chronic kidney disease, stage 3 unspecified: Secondary | ICD-10-CM

## 2018-05-05 DIAGNOSIS — Z8739 Personal history of other diseases of the musculoskeletal system and connective tissue: Secondary | ICD-10-CM | POA: Diagnosis not present

## 2018-05-05 DIAGNOSIS — L723 Sebaceous cyst: Secondary | ICD-10-CM

## 2018-05-05 DIAGNOSIS — D509 Iron deficiency anemia, unspecified: Secondary | ICD-10-CM

## 2018-05-05 DIAGNOSIS — Z7189 Other specified counseling: Secondary | ICD-10-CM

## 2018-05-05 DIAGNOSIS — Z936 Other artificial openings of urinary tract status: Secondary | ICD-10-CM

## 2018-05-05 DIAGNOSIS — E785 Hyperlipidemia, unspecified: Secondary | ICD-10-CM | POA: Diagnosis not present

## 2018-05-05 DIAGNOSIS — Z0000 Encounter for general adult medical examination without abnormal findings: Secondary | ICD-10-CM

## 2018-05-05 MED ORDER — FERROUS SULFATE 325 (65 FE) MG PO TABS: ORAL_TABLET | ORAL | 3 refills | Status: DC

## 2018-05-05 MED ORDER — ALLOPURINOL 100 MG PO TABS: 100.0000 mg | ORAL_TABLET | ORAL | 3 refills | Status: DC

## 2018-05-05 NOTE — Progress Notes (Signed)
His wife is bedridden from dementia and confusion.  He is checking on outside caretakers.  They have been married 41 years.   We discussed his situation.   He is managing.  He will update me as needed.  BP is lower on home checks.  No CP, SOB, BLE edema.  He doesn't feel skipped beats. Not lightheaded.    Elevated Cholesterol: Using medications without problems: yes Muscle aches: no Diet compliance: yes Labs d/w pt.    Still anticoagulated.  Still on iron.  H/o anemia.  Labs discussed with patient.  He had derm f/u with Dr. Manley Mason.  I will defer to dermatology.  He had carotid u/s 09/2017.  Not due for follow-up at this point.  Declined hearing aids.  D/w pt.   Vaccines up to date except for shingrix out of stock.  See avs.   Both sons designated if patient were incapacitated.   H/o bladder cancer.  He is going to call about urology follow up.  No blood passed.  No FCNAVD.    No recent gout sx.   Chronic kidney disease discussed with patient.  Creatinine slightly improved.  NSAID cautions discussed with patient.  Possible seb cyst on the L in groin.   Had been enlarging over the last few months.  PMH and SH reviewed  ROS: Per HPI unless specifically indicated in ROS section   Meds, vitals, and allergies reviewed.   GEN: nad, alert and oriented HEENT: mucous membranes moist NECK: supple w/o LA CV: Ectopy noted but not tacky.[name] PULM: ctab, no inc wob ABD: soft, +bs EXT: no edema SKIN: no acute rash but he has a mildly irritated sebaceous cyst near the left groin/inguinal area (he reports that it had been gradually enlarging for the last few months)

## 2018-05-05 NOTE — Patient Instructions (Addendum)
Check with your insurance to see if they will cover the shingrix shot.  Likely a sebaceous cyst in your groin.  Use warm compresses 2-3 times a day.  It may drain on its own. If worse or not draining, then I can drain it here.  Ask for a 30 minute appointment.  Hold the eliquis for 2 days if possible prior to that.    Take care.  Glad to see you.

## 2018-05-07 DIAGNOSIS — L723 Sebaceous cyst: Secondary | ICD-10-CM | POA: Insufficient documentation

## 2018-05-07 DIAGNOSIS — Z0000 Encounter for general adult medical examination without abnormal findings: Secondary | ICD-10-CM | POA: Insufficient documentation

## 2018-05-07 DIAGNOSIS — Z8739 Personal history of other diseases of the musculoskeletal system and connective tissue: Secondary | ICD-10-CM | POA: Insufficient documentation

## 2018-05-07 NOTE — Assessment & Plan Note (Addendum)
Continue work on diet and exercise as possible.  No change in meds.  Labs discussed with patient.  He agrees. >25 minutes spent in face to face time with patient, >50% spent in counselling or coordination of care.

## 2018-05-07 NOTE — Assessment & Plan Note (Signed)
Declined hearing aids.  D/w pt.   Vaccines up to date except for shingrix out of stock.  See avs.   Both sons designated if patient were incapacitated.

## 2018-05-07 NOTE — Assessment & Plan Note (Signed)
H/o bladder cancer.  He is going to call about urology follow up.  No blood passed.  No FCNAVD.

## 2018-05-07 NOTE — Assessment & Plan Note (Signed)
Still anticoagulated.  Still on iron.  H/o anemia.  Labs discussed with patient.  I would continue as is with iron and anticoagulation.  He is likely better off at this point to continue both instead of stopping either.  It would likely be more risky to go through an invasive work-up for chronic gastrointestinal blood loss.  He agrees.

## 2018-05-07 NOTE — Assessment & Plan Note (Signed)
Both sons designated if patient were incapacitated.

## 2018-05-07 NOTE — Assessment & Plan Note (Signed)
No recent gout sx. continue as is.  He agrees.

## 2018-05-07 NOTE — Assessment & Plan Note (Signed)
It looks minimally irritated but does not appear infected.  Not tender.  Discussed options.  I can drain this if needed but it would be nice if he could hold his anticoagulant for a day or 2 prior to I&D.  He agrees.  He can use warm compresses in the meantime.

## 2018-05-07 NOTE — Assessment & Plan Note (Signed)
Chronic kidney disease discussed with patient.  Creatinine slightly improved.  NSAID cautions discussed with patient.  He understood.

## 2018-05-07 NOTE — Assessment & Plan Note (Signed)
Continue statin.  He agrees.

## 2018-05-16 DIAGNOSIS — Z932 Ileostomy status: Secondary | ICD-10-CM | POA: Diagnosis not present

## 2018-05-29 ENCOUNTER — Other Ambulatory Visit: Payer: Self-pay | Admitting: Cardiovascular Disease

## 2018-06-25 ENCOUNTER — Other Ambulatory Visit: Payer: Self-pay | Admitting: Cardiovascular Disease

## 2018-06-30 ENCOUNTER — Telehealth: Payer: Self-pay

## 2018-06-30 NOTE — Telephone Encounter (Signed)
Virtual Visit Pre-Appointment Phone Call  Steps For Call:  1. Confirm consent - "In the setting of the current Covid19 crisis, you are scheduled for a TELEPHONE visit with your provider on 07/11/2018 at 08:40.  Just as we do with many in-office visits, in order for you to participate in this visit, we must obtain consent.  If you'd like, I can send this to your mychart (if signed up) or email for you to review.  Otherwise, I can obtain your verbal consent now.  All virtual visits are billed to your insurance company just like a normal visit would be.  By agreeing to a virtual visit, we'd like you to understand that the technology does not allow for your provider to perform an examination, and thus may limit your provider's ability to fully assess your condition.  Finally, though the technology is pretty good, we cannot assure that it will always work on either your or our end, and in the setting of a video visit, we may have to convert it to a phone-only visit.  In either situation, we cannot ensure that we have a secure connection.  Are you willing to proceed?"  2. Give patient instructions for WebEx download to smartphone as below if video visit  3. Advise patient to be prepared with any vital sign or heart rhythm information, their current medicines, and a piece of paper and pen handy for any instructions they may receive the day of their visit  4. Inform patient they will receive a phone call 15 minutes prior to their appointment time (may be from unknown caller ID) so they should be prepared to answer  5. Confirm that appointment type is correct in Epic appointment notes (video vs telephone)    TELEPHONE CALL NOTE  Kenneth Solian. has been deemed a candidate for a follow-up tele-health visit to limit community exposure during the Covid-19 pandemic. I spoke with the patient via phone to ensure availability of phone/video source, confirm preferred email & phone number, and discuss  instructions and expectations.  I reminded Kenneth Hayashi. to be prepared with any vital sign and/or heart rhythm information that could potentially be obtained via home monitoring, at the time of his visit. I reminded Kenneth Mackie. to expect a phone call at the time of his visit if his visit.  Did the patient verbally acknowledge consent to treatment? YES  [name], [place] 06/30/2018 1:15 PM  CONSENT FOR TELE-HEALTH VISIT - PLEASE REVIEW  I hereby voluntarily request, consent and authorize [facility] and its employed or contracted physicians, physician assistants, nurse practitioners or other licensed health care professionals (the Practitioner), to provide me with telemedicine health care services (the Services") as deemed necessary by the treating Practitioner. I acknowledge and consent to receive the Services by the Practitioner via telemedicine. I understand that the telemedicine visit will involve communicating with the Practitioner through live audiovisual communication technology and the disclosure of certain medical information by electronic transmission. I acknowledge that I have been given the opportunity to request an in-person assessment or other available alternative prior to the telemedicine visit and am voluntarily participating in the telemedicine visit.  I understand that I have the right to withhold or withdraw my consent to the use of telemedicine in the course of my care at any time, without affecting my right to future care or treatment, and that the Practitioner or I may terminate the telemedicine visit at any time. I understand  that I have the right to inspect all information obtained and/or recorded in the course of the telemedicine visit and may receive copies of available information for a reasonable fee.  I understand that some of the potential risks of receiving the Services via telemedicine include:   Delay or interruption in medical evaluation due to  technological equipment failure or disruption;  Information transmitted may not be sufficient (e.g. poor resolution of images) to allow for appropriate medical decision making by the Practitioner; and/or   In rare instances, security protocols could fail, causing a breach of personal health information.  Furthermore, I acknowledge that it is my responsibility to provide information about my medical history, conditions and care that is complete and accurate to the best of my ability. I acknowledge that Practitioner's advice, recommendations, and/or decision may be based on factors not within their control, such as incomplete or inaccurate data provided by me or distortions of diagnostic images or specimens that may result from electronic transmissions. I understand that the practice of medicine is not an exact science and that Practitioner makes no warranties or guarantees regarding treatment outcomes. I acknowledge that I will receive a copy of this consent concurrently upon execution via email to the email address I last provided but may also request a printed copy by calling the office of [facility].    I understand that my insurance will be billed for this visit.   I have read or had this consent read to me.  I understand the contents of this consent, which adequately explains the benefits and risks of the Services being provided via telemedicine.   I have been provided ample opportunity to ask questions regarding this consent and the Services and have had my questions answered to my satisfaction.  I give my informed consent for the services to be provided through the use of telemedicine in my medical care  By participating in this telemedicine visit I agree to the above.
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
## 2018-07-11 ENCOUNTER — Telehealth (INDEPENDENT_AMBULATORY_CARE_PROVIDER_SITE_OTHER): Payer: Medicare HMO | Admitting: Cardiovascular Disease

## 2018-07-11 ENCOUNTER — Other Ambulatory Visit: Payer: Self-pay

## 2018-07-11 DIAGNOSIS — I6523 Occlusion and stenosis of bilateral carotid arteries: Secondary | ICD-10-CM

## 2018-07-11 DIAGNOSIS — I739 Peripheral vascular disease, unspecified: Secondary | ICD-10-CM

## 2018-07-11 DIAGNOSIS — I482 Chronic atrial fibrillation, unspecified: Secondary | ICD-10-CM | POA: Diagnosis not present

## 2018-07-11 DIAGNOSIS — I25708 Atherosclerosis of coronary artery bypass graft(s), unspecified, with other forms of angina pectoris: Secondary | ICD-10-CM | POA: Diagnosis not present

## 2018-07-11 DIAGNOSIS — N183 Chronic kidney disease, stage 3 unspecified: Secondary | ICD-10-CM

## 2018-07-11 DIAGNOSIS — I10 Essential (primary) hypertension: Secondary | ICD-10-CM | POA: Diagnosis not present

## 2018-07-11 DIAGNOSIS — I5032 Chronic diastolic (congestive) heart failure: Secondary | ICD-10-CM

## 2018-07-11 MED ORDER — METOPROLOL SUCCINATE ER 25 MG PO TB24: 25.0000 mg | ORAL_TABLET | Freq: Every day | ORAL | 3 refills | Status: DC

## 2018-07-11 MED ORDER — ROSUVASTATIN CALCIUM 5 MG PO TABS: 5.0000 mg | ORAL_TABLET | Freq: Every day | ORAL | 3 refills | Status: DC

## 2018-07-11 MED ORDER — NITROGLYCERIN 0.4 MG SL SUBL: 0.4000 mg | SUBLINGUAL_TABLET | SUBLINGUAL | 2 refills | Status: AC | PRN

## 2018-07-11 MED ORDER — APIXABAN 2.5 MG PO TABS: 2.5000 mg | ORAL_TABLET | Freq: Two times a day (BID) | ORAL | 3 refills | Status: DC

## 2018-07-11 NOTE — Patient Instructions (Addendum)
Medication Instructions:  Your physician has recommended you make the following change in your medication:  1. INCREASED Rosuvastatin (Crestor) to 1 tablet 5 mg once daily alternating with 2 tablets (10 mg) once daily   If you need a refill on your cardiac medications before your next appointment, please call your pharmacy.    Lab work: No new labs needed   If you have labs (blood work) drawn today and your tests are completely normal, you will receive your results only by: [name] MyChart Message (if you have MyChart) OR . A paper copy in the mail If you have any lab test that is abnormal or we need to change your treatment, we will call you to review the results.   Testing/Procedures: No new testing needed   Follow-Up: At [facility], you and your health needs are our priority.  As part of our continuing mission to provide you with exceptional heart care, we have created designated Provider Care Teams.  These Care Teams include your primary Cardiologist (physician) and Advanced Practice Providers (APPs -  Physician Assistants and Nurse Practitioners) who all work together to provide you with the care you need, when you need it.  . You will need a follow up appointment in 12 months .   Please call our office 2 months in advance to schedule this appointment.    . Providers on your designated Care Team:   . Murray Hodgkins, NP . Christell Faith, PA-C . Marrianne Mood, PA-C  Any Other Special Instructions Will Be Listed Below (If Applicable).  For educational health videos Log in to : [email or web address] Or : [email or web address], password : [email or web address]

## 2018-07-22 ENCOUNTER — Other Ambulatory Visit: Payer: Self-pay | Admitting: Cardiovascular Disease

## 2018-08-15 DIAGNOSIS — Z932 Ileostomy status: Secondary | ICD-10-CM | POA: Diagnosis not present

## 2018-09-20 ENCOUNTER — Telehealth: Payer: Self-pay | Admitting: Internal Medicine

## 2018-09-20 MED ORDER — FAMOTIDINE 40 MG PO TABS: 40.0000 mg | ORAL_TABLET | Freq: Every day | ORAL | 5 refills | Status: DC

## 2018-09-20 NOTE — Telephone Encounter (Signed)
Famotidine refilled as requested.

## 2018-11-02 ENCOUNTER — Encounter: Payer: Self-pay | Admitting: Family Medicine

## 2018-11-15 DIAGNOSIS — Z932 Ileostomy status: Secondary | ICD-10-CM | POA: Diagnosis not present

## 2019-01-10 ENCOUNTER — Encounter: Payer: Self-pay | Admitting: *Deleted

## 2019-02-07 ENCOUNTER — Telehealth: Payer: Self-pay

## 2019-02-07 NOTE — Telephone Encounter (Signed)
Pt left v/m that [facility] would be receiving a fax from [place] [facility] in [facility] for a renewal rx for urology bags and other urological supplies. Pt request cb when fax is sent back to [facility].

## 2019-02-07 NOTE — Telephone Encounter (Signed)
Noted. Thanks. Will await the fax.  Routed back to [place] as FYI.

## 2019-02-08 NOTE — Telephone Encounter (Signed)
Form placed in Dr. Josefine Class In [place].

## 2019-02-11 NOTE — Telephone Encounter (Signed)
Form signed. Thanks!

## 2019-02-12 NOTE — Telephone Encounter (Signed)
Faxed

## 2019-02-14 DIAGNOSIS — Z932 Ileostomy status: Secondary | ICD-10-CM | POA: Diagnosis not present

## 2019-03-22 ENCOUNTER — Other Ambulatory Visit: Payer: Self-pay | Admitting: Internal Medicine

## 2019-04-24 ENCOUNTER — Telehealth: Payer: Self-pay

## 2019-04-24 NOTE — Telephone Encounter (Signed)
Pt wants to know if Dr Damita Dunnings thinks pt should get the covid vaccine. Pt said Dr Damita Dunnings recommended the regular flu shot rather than the high dose flu shot for him. Pt said he had never had a reaction to a flu shot before. Also pt does not have a computer or a mychart acct and I was going to contact the [employer] for pt to see what is needed if pt does need to take the covid vaccine but they respond by my chart. FYI to Dr Damita Dunnings.

## 2019-04-24 NOTE — Telephone Encounter (Signed)
He should be able to get the Covid vaccine.  I think it makes sense to get this done when possible.  I do not know of an option to sign up at this point other than the website.  If you have a useful phone number for him to call at the health department then please pass that along.  Thanks.

## 2019-04-24 NOTE — Telephone Encounter (Signed)
Spoke with pt and informed him of Dr. Josefine Class message. Provided pt with [phone] to call and see about being scheduled. I informed pt that if line is busy to keep trying as this can happen with a lot of people trying to schedule

## 2019-05-03 ENCOUNTER — Other Ambulatory Visit: Payer: Self-pay | Admitting: Family Medicine

## 2019-05-03 NOTE — Telephone Encounter (Signed)
Electronic refill request. Allopurinol Last office visit:   05/05/2018 Last Filled:    45 tablet 3 05/05/2018  Please advise.

## 2019-05-04 NOTE — Telephone Encounter (Signed)
Sent. Thanks.  Has follow-up pending

## 2019-05-09 ENCOUNTER — Other Ambulatory Visit (INDEPENDENT_AMBULATORY_CARE_PROVIDER_SITE_OTHER): Payer: Medicare HMO

## 2019-05-09 ENCOUNTER — Ambulatory Visit: Payer: Medicare HMO

## 2019-05-09 ENCOUNTER — Other Ambulatory Visit: Payer: Self-pay

## 2019-05-09 ENCOUNTER — Ambulatory Visit (INDEPENDENT_AMBULATORY_CARE_PROVIDER_SITE_OTHER): Payer: Medicare HMO

## 2019-05-09 ENCOUNTER — Other Ambulatory Visit: Payer: Self-pay | Admitting: Family Medicine

## 2019-05-09 DIAGNOSIS — Z8739 Personal history of other diseases of the musculoskeletal system and connective tissue: Secondary | ICD-10-CM

## 2019-05-09 DIAGNOSIS — E785 Hyperlipidemia, unspecified: Secondary | ICD-10-CM | POA: Diagnosis not present

## 2019-05-09 DIAGNOSIS — D509 Iron deficiency anemia, unspecified: Secondary | ICD-10-CM

## 2019-05-09 DIAGNOSIS — Z0000 Encounter for general adult medical examination without abnormal findings: Secondary | ICD-10-CM

## 2019-05-09 LAB — CBC WITH DIFFERENTIAL/PLATELET
Basophils Absolute: 0.1 10*3/uL (ref 0.0–0.1)
Basophils Relative: 0.6 % (ref 0.0–3.0)
Eosinophils Absolute: 0.5 10*3/uL (ref 0.0–0.7)
Eosinophils Relative: 5 % (ref 0.0–5.0)
HCT: 44.1 % (ref 39.0–52.0)
Hemoglobin: 14.3 g/dL (ref 13.0–17.0)
Lymphocytes Relative: 15.7 % (ref 12.0–46.0)
Lymphs Abs: 1.5 10*3/uL (ref 0.7–4.0)
MCHC: 32.5 g/dL (ref 30.0–36.0)
MCV: 96.6 fl (ref 78.0–100.0)
Monocytes Absolute: 1 10*3/uL (ref 0.1–1.0)
Monocytes Relative: 10.6 % (ref 3.0–12.0)
Neutro Abs: 6.5 10*3/uL (ref 1.4–7.7)
Neutrophils Relative %: 68.1 % (ref 43.0–77.0)
Platelets: 264 10*3/uL (ref 150.0–400.0)
RBC: 4.57 Mil/uL (ref 4.22–5.81)
RDW: 14.3 % (ref 11.5–15.5)
WBC: 9.5 10*3/uL (ref 4.0–10.5)

## 2019-05-09 LAB — COMPREHENSIVE METABOLIC PANEL
ALT: 6 U/L (ref 0–53)
AST: 16 U/L (ref 0–37)
Albumin: 4.2 g/dL (ref 3.5–5.2)
Alkaline Phosphatase: 74 U/L (ref 39–117)
BUN: 28 mg/dL — ABNORMAL HIGH (ref 6–23)
CO2: 29 mEq/L (ref 19–32)
Calcium: 9.8 mg/dL (ref 8.4–10.5)
Chloride: 101 mEq/L (ref 96–112)
Creatinine, Ser: 2.21 mg/dL — ABNORMAL HIGH (ref 0.40–1.50)
GFR: 28.37 mL/min — ABNORMAL LOW (ref >60.00)
Glucose, Bld: 104 mg/dL — ABNORMAL HIGH (ref 70–99)
Potassium: 5 mEq/L (ref 3.5–5.1)
Sodium: 139 mEq/L (ref 135–145)
Total Bilirubin: 0.6 mg/dL (ref 0.2–1.2)
Total Protein: 7.2 g/dL (ref 6.0–8.3)

## 2019-05-09 LAB — IRON: Iron: 96 ug/dL (ref 42–165)

## 2019-05-09 LAB — URIC ACID: Uric Acid, Serum: 6.8 mg/dL (ref 4.0–7.8)

## 2019-05-09 LAB — LIPID PANEL
Cholesterol: 164 mg/dL (ref 0–200)
HDL: 83.3 mg/dL (ref >39.00)
LDL Cholesterol: 62 mg/dL (ref 0–99)
NonHDL: 80.63
Total CHOL/HDL Ratio: 2
Triglycerides: 95 mg/dL (ref 0.0–149.0)
VLDL: 19 mg/dL (ref 0.0–40.0)

## 2019-05-09 NOTE — Progress Notes (Signed)
PCP notes:  Health Maintenance: Needs flu vaccine   Abnormal Screenings: none   Patient concerns: none   Nurse concerns: none   Next PCP appt.: 05/15/2019 @ 8:15 am
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
## 2019-05-09 NOTE — Patient Instructions (Signed)
[name] , Thank you for taking time to come for your Medicare Wellness Visit. I appreciate your ongoing commitment to your health goals. Please review the following plan we discussed and let me know if I can assist you in the future.   Screening recommendations/referrals: Colonoscopy: no longer required Recommended yearly ophthalmology/optometry visit for glaucoma screening and checkup Recommended yearly dental visit for hygiene and checkup  Vaccinations: Influenza vaccine: will get next week  Pneumococcal vaccine: Completed series Tdap vaccine: Up to date, completed 01/20/2010 Shingles vaccine: discussed    Advanced directives: copy in chart  Conditions/risks identified: hypertension, hyperlipidemia  Next appointment: 05/15/2019 @ 8:15 am   Preventive Care 41 Years and Older, Male Preventive care refers to lifestyle choices and visits with your health care provider that can promote health and wellness. What does preventive care include?  A yearly physical exam. This is also called an annual well check.  Dental exams once or twice a year.  Routine eye exams. Ask your health care provider how often you should have your eyes checked.  Personal lifestyle choices, including:  Daily care of your teeth and gums.  Regular physical activity.  Eating a healthy diet.  Avoiding tobacco and drug use.  Limiting alcohol use.  Practicing safe sex.  Taking low doses of aspirin every day.  Taking vitamin and mineral supplements as recommended by your health care provider. What happens during an annual well check? The services and screenings done by your health care provider during your annual well check will depend on your age, overall health, lifestyle risk factors, and family history of disease. Counseling  Your health care provider may ask you questions about your:  Alcohol use.  Tobacco use.  Drug use.  Emotional well-being.  Home and relationship well-being.  Sexual  activity.  Eating habits.  History of falls.  Memory and ability to understand (cognition).  Work and work Statistician. Screening  You may have the following tests or measurements:  Height, weight, and BMI.  Blood pressure.  Lipid and cholesterol levels. These may be checked every 5 years, or more frequently if you are over 15 years old.  Skin check.  Lung cancer screening. You may have this screening every year starting at age 57 if you have a 30-pack-year history of smoking and currently smoke or have quit within the past 15 years.  Fecal occult blood test (FOBT) of the stool. You may have this test every year starting at age 28.  Flexible sigmoidoscopy or colonoscopy. You may have a sigmoidoscopy every 5 years or a colonoscopy every 10 years starting at age 53.  Prostate cancer screening. Recommendations will vary depending on your family history and other risks.  Hepatitis C blood test.  Hepatitis B blood test.  Sexually transmitted disease (STD) testing.  Diabetes screening. This is done by checking your blood sugar (glucose) after you have not eaten for a while (fasting). You may have this done every 1-3 years.  Abdominal aortic aneurysm (AAA) screening. You may need this if you are a current or former smoker.  Osteoporosis. You may be screened starting at age 77 if you are at high risk. Talk with your health care provider about your test results, treatment options, and if necessary, the need for more tests. Vaccines  Your health care provider may recommend certain vaccines, such as:  Influenza vaccine. This is recommended every year.  Tetanus, diphtheria, and acellular pertussis (Tdap, Td) vaccine. You may need a Td booster every 10 years.  Zoster vaccine. You may need this after age 23.  Pneumococcal 13-valent conjugate (PCV13) vaccine. One dose is recommended after age 63.  Pneumococcal polysaccharide (PPSV23) vaccine. One dose is recommended after age  49. Talk to your health care provider about which screenings and vaccines you need and how often you need them. This information is not intended to replace advice given to you by your health care provider. Make sure you discuss any questions you have with your health care provider. Document Released: 04/18/2015 Document Revised: 12/10/2015 Document Reviewed: 01/21/2015 Elsevier Interactive Patient Education  2017 [place] Prevention in the Home Falls can cause injuries. They can happen to people of all ages. There are many things you can do to make your home safe and to help prevent falls. What can I do on the outside of my home?  Regularly fix the edges of walkways and driveways and fix any cracks.  Remove anything that might make you trip as you walk through a door, such as a raised step or threshold.  Trim any bushes or trees on the path to your home.  Use bright outdoor lighting.  Clear any walking paths of anything that might make someone trip, such as rocks or tools.  Regularly check to see if handrails are loose or broken. Make sure that both sides of any steps have handrails.  Any raised decks and porches should have guardrails on the edges.  Have any leaves, snow, or ice cleared regularly.  Use sand or salt on walking paths during winter.  Clean up any spills in your garage right away. This includes oil or grease spills. What can I do in the bathroom?  Use night lights.  Install grab bars by the toilet and in the tub and shower. Do not use towel bars as grab bars.  Use non-skid mats or decals in the tub or shower.  If you need to sit down in the shower, use a plastic, non-slip stool.  Keep the floor dry. Clean up any water that spills on the floor as soon as it happens.  Remove soap buildup in the tub or shower regularly.  Attach bath mats securely with double-sided non-slip rug tape.  Do not have throw rugs and other things on the floor that can make  you trip. What can I do in the bedroom?  Use night lights.  Make sure that you have a light by your bed that is easy to reach.  Do not use any sheets or blankets that are too big for your bed. They should not hang down onto the floor.  Have a firm chair that has side arms. You can use this for support while you get dressed.  Do not have throw rugs and other things on the floor that can make you trip. What can I do in the kitchen?  Clean up any spills right away.  Avoid walking on wet floors.  Keep items that you use a lot in easy-to-reach places.  If you need to reach something above you, use a strong step stool that has a grab bar.  Keep electrical cords out of the way.  Do not use floor polish or wax that makes floors slippery. If you must use wax, use non-skid floor wax.  Do not have throw rugs and other things on the floor that can make you trip. What can I do with my stairs?  Do not leave any items on the stairs.  Make sure that there are  handrails on both sides of the stairs and use them. Fix handrails that are broken or loose. Make sure that handrails are as long as the stairways.  Check any carpeting to make sure that it is firmly attached to the stairs. Fix any carpet that is loose or worn.  Avoid having throw rugs at the top or bottom of the stairs. If you do have throw rugs, attach them to the floor with carpet tape.  Make sure that you have a light switch at the top of the stairs and the bottom of the stairs. If you do not have them, ask someone to add them for you. What else can I do to help prevent falls?  Wear shoes that:  Do not have high heels.  Have rubber bottoms.  Are comfortable and fit you well.  Are closed at the toe. Do not wear sandals.  If you use a stepladder:  Make sure that it is fully opened. Do not climb a closed stepladder.  Make sure that both sides of the stepladder are locked into place.  Ask someone to hold it for you, if  possible.  Clearly mark and make sure that you can see:  Any grab bars or handrails.  First and last steps.  Where the edge of each step is.  Use tools that help you move around (mobility aids) if they are needed. These include:  Canes.  Walkers.  Scooters.  Crutches.  Turn on the lights when you go into a dark area. Replace any light bulbs as soon as they burn out.  Set up your furniture so you have a clear path. Avoid moving your furniture around.  If any of your floors are uneven, fix them.  If there are any pets around you, be aware of where they are.  Review your medicines with your doctor. Some medicines can make you feel dizzy. This can increase your chance of falling. Ask your doctor what other things that you can do to help prevent falls. This information is not intended to replace advice given to you by your health care provider. Make sure you discuss any questions you have with your health care provider. Document Released: 01/16/2009 Document Revised: 08/28/2015 Document Reviewed: 04/26/2014 Elsevier Interactive Patient Education  2017 [employer].

## 2019-05-15 ENCOUNTER — Other Ambulatory Visit: Payer: Self-pay

## 2019-05-15 ENCOUNTER — Encounter: Payer: Self-pay | Admitting: Family Medicine

## 2019-05-15 ENCOUNTER — Ambulatory Visit (INDEPENDENT_AMBULATORY_CARE_PROVIDER_SITE_OTHER): Payer: Medicare HMO | Admitting: Family Medicine

## 2019-05-15 VITALS — BP 142/80 | HR 67 | Temp 96.5°F | Ht 71.5 in | Wt 161.1 lb

## 2019-05-15 DIAGNOSIS — Z23 Encounter for immunization: Secondary | ICD-10-CM

## 2019-05-15 DIAGNOSIS — L989 Disorder of the skin and subcutaneous tissue, unspecified: Secondary | ICD-10-CM

## 2019-05-15 DIAGNOSIS — N183 Chronic kidney disease, stage 3 unspecified: Secondary | ICD-10-CM

## 2019-05-15 DIAGNOSIS — Z0000 Encounter for general adult medical examination without abnormal findings: Secondary | ICD-10-CM

## 2019-05-15 DIAGNOSIS — Z8739 Personal history of other diseases of the musculoskeletal system and connective tissue: Secondary | ICD-10-CM

## 2019-05-15 DIAGNOSIS — E785 Hyperlipidemia, unspecified: Secondary | ICD-10-CM

## 2019-05-15 DIAGNOSIS — Z936 Other artificial openings of urinary tract status: Secondary | ICD-10-CM | POA: Diagnosis not present

## 2019-05-15 DIAGNOSIS — Z932 Ileostomy status: Secondary | ICD-10-CM | POA: Diagnosis not present

## 2019-05-15 DIAGNOSIS — I480 Paroxysmal atrial fibrillation: Secondary | ICD-10-CM | POA: Diagnosis not present

## 2019-05-15 DIAGNOSIS — Z7189 Other specified counseling: Secondary | ICD-10-CM

## 2019-05-15 MED ORDER — FERROUS SULFATE 325 (65 FE) MG PO TABS: ORAL_TABLET | ORAL | Status: DC

## 2019-05-15 MED ORDER — FERROUS SULFATE 325 (65 FE) MG PO TABS: ORAL_TABLET | ORAL | 3 refills | Status: AC

## 2019-05-15 NOTE — Patient Instructions (Signed)
Recheck in about 6 months, labs at or ahead of the visit.  Update me as needed.  Take care.  Glad to see you.

## 2019-05-15 NOTE — Progress Notes (Signed)
This visit occurred during the SARS-CoV-2 public health emergency.  Safety protocols were in place, including screening questions prior to the visit, additional usage of staff PPE, and extensive cleaning of exam room while observing appropriate contact time as indicated for disinfecting solutions.  Flu vaccine done today.   covid vaccine d/w pt.  Td 2011 shingrix d/w pt.   PNA up to date.   PSA and colon CA screening not indicated.  Both sons designated if patient were incapacitated.  He is still trying to adjust to the loss of his wife.  Discussed.  He'll update me as needed.  His son are supportive.  We talked about isolation from covid.    Elevated Cholesterol: Using medications without problems: yes Muscle aches: no Diet compliance: yes Exercise: limited, walking with cane.    No gout flares.  Taking allopurinol 100mg  QOD.    Anticoagulated for AF.  Still on iron QOD.  Constipation with QD iron.  He isn't passing blood.  D/w pt.  No CP, minimal dyspnea- this is at baseline.  No BLE edema.  No heart racing.    Urostomy d/w pt.  He is tolerating that and changing his bag and working with that.  He doesn't have skin breakdown.    CKD.  Cr stable, d/w pt.  Avoiding nsaids.  Labs discussed with patient.  PMH and SH reviewed  ROS: Per HPI unless specifically indicated in ROS section   Meds, vitals, and allergies reviewed.   GEN: nad, alert and oriented HEENT: ncat NECK: supple w/o LA CV: rrr PULM: ctab, no inc wob ABD: soft, +bs EXT: no edema SKIN: no acute rash but scaly lesion without ulceration noted on the left side of the nose.  It has been previously frozen by dermatology.  Discussed option.  Refrozen x3 with liquid nitrogen at office visit.  Tolerated well.  No complication.  Routine cautions given to patient.

## 2019-05-16 NOTE — Assessment & Plan Note (Signed)
Cr stable, d/w pt.  Avoiding nsaids.  Labs discussed with patient.  Discussed recheck in 6 months.  He would not have to see the renal clinic at this point.  I offered this and he did not want to follow through with this yet.  That is reasonable.

## 2019-05-16 NOTE — Assessment & Plan Note (Signed)
Urostomy d/w pt.  He is tolerating that and changing his bag and working with that.  He doesn't have skin breakdown.   Discussed with patient about urology follow-up.

## 2019-05-16 NOTE — Assessment & Plan Note (Signed)
Continue statin.  No adverse effect on medication.  Labs discussed with patient.  He agrees.

## 2019-05-16 NOTE — Assessment & Plan Note (Signed)
Both sons designated if patient were incapacitated.

## 2019-05-16 NOTE — Assessment & Plan Note (Signed)
no acute rash but scaly lesion noted on the left side of the nose.  It has been previously frozen by dermatology.  Discussed option.  Refrozen x3 with liquid nitrogen at office visit.  Tolerated well.  No complication.  Routine cautions given to patient.

## 2019-05-16 NOTE — Assessment & Plan Note (Signed)
No gout flares.  Taking allopurinol 100mg  QOD.   This is reasonable given his kidney function.  Discussed.  Uric acid reasonable.

## 2019-05-16 NOTE — Assessment & Plan Note (Signed)
Anticoagulated for AF.  Still on iron QOD.  Constipation with QD iron.  He isn't passing blood.  D/w pt.  No CP, minimal dyspnea- this is at baseline.  No BLE edema.  No heart racing.   Continue as is.  He agrees.

## 2019-05-16 NOTE — Assessment & Plan Note (Addendum)
Flu vaccine done today.   covid vaccine d/w pt.  Td 2011 shingrix d/w pt.   PNA up to date.   PSA and colon CA screening not indicated.  Both sons designated if patient were incapacitated.  He is still trying to adjust to the loss of his wife.  Discussed.  He'll update me as needed.  His son are supportive.  We talked about isolation from covid.

## 2019-06-25 ENCOUNTER — Other Ambulatory Visit: Payer: Self-pay | Admitting: Cardiovascular Disease

## 2019-07-18 ENCOUNTER — Other Ambulatory Visit: Payer: Self-pay | Admitting: Internal Medicine
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
## 2019-07-31 ENCOUNTER — Ambulatory Visit (INDEPENDENT_AMBULATORY_CARE_PROVIDER_SITE_OTHER): Payer: Medicare HMO | Admitting: Cardiovascular Disease

## 2019-07-31 ENCOUNTER — Encounter: Payer: Self-pay | Admitting: Cardiovascular Disease

## 2019-07-31 ENCOUNTER — Other Ambulatory Visit: Payer: Self-pay

## 2019-07-31 VITALS — BP 138/70 | HR 70 | Ht 72.0 in | Wt 146.1 lb

## 2019-07-31 DIAGNOSIS — I350 Nonrheumatic aortic (valve) stenosis: Secondary | ICD-10-CM | POA: Diagnosis not present

## 2019-07-31 DIAGNOSIS — I25708 Atherosclerosis of coronary artery bypass graft(s), unspecified, with other forms of angina pectoris: Secondary | ICD-10-CM

## 2019-07-31 DIAGNOSIS — I482 Chronic atrial fibrillation, unspecified: Secondary | ICD-10-CM

## 2019-07-31 DIAGNOSIS — N183 Chronic kidney disease, stage 3 unspecified: Secondary | ICD-10-CM | POA: Diagnosis not present

## 2019-07-31 DIAGNOSIS — I358 Other nonrheumatic aortic valve disorders: Secondary | ICD-10-CM

## 2019-07-31 DIAGNOSIS — I10 Essential (primary) hypertension: Secondary | ICD-10-CM

## 2019-07-31 DIAGNOSIS — I739 Peripheral vascular disease, unspecified: Secondary | ICD-10-CM

## 2019-07-31 DIAGNOSIS — I6523 Occlusion and stenosis of bilateral carotid arteries: Secondary | ICD-10-CM

## 2019-07-31 DIAGNOSIS — I5032 Chronic diastolic (congestive) heart failure: Secondary | ICD-10-CM

## 2019-07-31 MED ORDER — APIXABAN 2.5 MG PO TABS: 2.5000 mg | ORAL_TABLET | Freq: Two times a day (BID) | ORAL | 3 refills | Status: AC

## 2019-07-31 NOTE — Patient Instructions (Addendum)
Medication Instructions:  No changes  If you need a refill on your cardiac medications before your next appointment, please call your pharmacy.    Lab work: No new labs needed   If you have labs (blood work) drawn today and your tests are completely normal, you will receive your results only by: [name] MyChart Message (if you have MyChart) OR . A paper copy in the mail If you have any lab test that is abnormal or we need to change your treatment, we will call you to review the results.   Testing/Procedures: Echo for aortic valve stenosis Your physician has requested that you have an echocardiogram. Echocardiography is a painless test that uses sound waves to create images of your heart. It provides your doctor with information about the size and shape of your heart and how well your heart's chambers and valves are working. This procedure takes approximately one hour. There are no restrictions for this procedure.     Follow-Up: At [facility], you and your health needs are our priority.  As part of our continuing mission to provide you with exceptional heart care, we have created designated Provider Care Teams.  These Care Teams include your primary Cardiologist (physician) and Advanced Practice Providers (APPs -  Physician Assistants and Nurse Practitioners) who all work together to provide you with the care you need, when you need it.  . You will need a follow up appointment in 12 months   . Providers on your designated Care Team:   . Murray Hodgkins, NP . Christell Faith, PA-C . Marrianne Mood, PA-C  Any Other Special Instructions Will Be Listed Below (If Applicable).  For educational health videos Log in to : [email or web address] Or : [email or web address], password : [email or web address]

## 2019-08-03 ENCOUNTER — Other Ambulatory Visit: Payer: Self-pay | Admitting: Cardiovascular Disease

## 2019-08-08 ENCOUNTER — Telehealth: Payer: Self-pay

## 2019-08-08 ENCOUNTER — Encounter: Payer: Self-pay | Admitting: Internal Medicine

## 2019-08-08 ENCOUNTER — Ambulatory Visit: Payer: Medicare HMO | Admitting: Internal Medicine

## 2019-08-08 VITALS — BP 124/66 | HR 70 | Temp 98.1°F | Ht 71.0 in | Wt 144.0 lb

## 2019-08-08 DIAGNOSIS — R1310 Dysphagia, unspecified: Secondary | ICD-10-CM | POA: Diagnosis not present

## 2019-08-08 DIAGNOSIS — Z789 Other specified health status: Secondary | ICD-10-CM

## 2019-08-08 DIAGNOSIS — R1319 Other dysphagia: Secondary | ICD-10-CM

## 2019-08-08 DIAGNOSIS — K219 Gastro-esophageal reflux disease without esophagitis: Secondary | ICD-10-CM | POA: Diagnosis not present

## 2019-08-08 DIAGNOSIS — T18128D Food in esophagus causing other injury, subsequent encounter: Secondary | ICD-10-CM

## 2019-08-08 DIAGNOSIS — Z01818 Encounter for other preprocedural examination: Secondary | ICD-10-CM
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
## 2019-08-08 NOTE — Telephone Encounter (Signed)
Pharmacy, please provide recommendations regarding holding Eliquis for upcoming EGD. Hx of Afib. Please route response to P CV DIV PREOP. Thanks

## 2019-08-08 NOTE — Telephone Encounter (Signed)
Patient with diagnosis of afib on Eliquis for anticoagulation.    Procedure: endoscopy Date of procedure: 08/14/19  CHADS2-VASc score of 5 (age x2, CHF, HTN, CAD/PVD)  SCr 2.21, CrCl 62mL/min. Pt on lower dose of Eliquis due to age and renal dysfunction Platelet count 264  Per office protocol, patient can hold Eliquis for 2-3 days prior to procedure.

## 2019-08-08 NOTE — Telephone Encounter (Signed)
[facility] Pre-operative Risk Assessment     Request for surgical clearance:     Endoscopy Procedure  What type of surgery is being performed?     EGD  When is this surgery scheduled?     08/14/2019  What type of clearance is required ?   Pharmacy  Are there any medications that need to be held prior to surgery and how long? Eliquis, 2 days  Practice name and name of physician performing surgery?      [facility]  What is your office phone and fax number?      Phone- [phone]  [phone]  Anesthesia type (None, local, MAC, general) ?       MAC   URGENT REQUEST, THANK YOU

## 2019-08-08 NOTE — Patient Instructions (Signed)
You have been scheduled for an endoscopy. Please follow written instructions given to you at your visit today. If you use inhalers (even only as needed), please bring them with you on the day of your procedure.   You will be contaced by our office prior to your procedure for directions on holding your Eliquis.  If you do not hear from our office 1 week prior to your scheduled procedure, please call [phone] to discuss.   I appreciate the opportunity to care for you. Silvano Rusk, MD, [facility]

## 2019-08-09 ENCOUNTER — Encounter: Payer: Self-pay | Admitting: Internal Medicine

## 2019-08-10 ENCOUNTER — Ambulatory Visit (INDEPENDENT_AMBULATORY_CARE_PROVIDER_SITE_OTHER): Payer: Medicare HMO

## 2019-08-10 DIAGNOSIS — Z1159 Encounter for screening for other viral diseases: Secondary | ICD-10-CM | POA: Diagnosis not present

## 2019-08-10 NOTE — Telephone Encounter (Signed)
Dr. Rockey Situ, Following up on Reino Bellis' last note: During his office visit on 07/31/19, you noted worsening murmur and ordered a repeat echo that is scheduled for 09/11/19. We have received a request for clearance for EDG. I have received an updated request from the office asking for clearance and to instruct him to hold eliquis by 5 pm today. Can he proceed with EDG prior to his repeat echo? Or does he need to reschedule EGD until after echo is completed?  Thanks Angie

## 2019-08-10 NOTE — Telephone Encounter (Signed)
I am aware of his pending Echo  He is having sig dysphagia issues  On exam and by clinical hx I thought he was ok for EGD and Propofol sedation  I was asking about holding Eliquis only  Thanks

## 2019-08-10 NOTE — Telephone Encounter (Signed)
Dr Carlean Purl we have not heard back on this matter , please advise Sir?

## 2019-08-10 NOTE — Telephone Encounter (Signed)
I have informed Mr Imhof that it is okay per Cardiology to hold the Eliquis 2 days and he verbalized understanding.

## 2019-08-11 LAB — SARS CORONAVIRUS 2 (TAT 6-24 HRS): SARS Coronavirus 2: NEGATIVE

## 2019-08-13 NOTE — Telephone Encounter (Signed)
   Primary Cardiologist: [name], MD  Chart reviewed as part of pre-operative protocol coverage. As below, Dr. Carlean Purl requested pharm input only rather than medical clearance. Reviewed with Dr. Rockey Situ who indicates he will defer to Dr. Carlean Purl on this since he had reviewed risk/benefit and felt it was safe to proceed without our input. GI team has already relayed instructions to patient.  I will remove clearance from pre-op pool.  Please call with questions.  Charlie Pitter, PA-C 08/13/2019, 10:05 AM

## 2019-08-14 ENCOUNTER — Other Ambulatory Visit: Payer: Self-pay

## 2019-08-14 ENCOUNTER — Encounter: Payer: Self-pay | Admitting: Internal Medicine

## 2019-08-14 ENCOUNTER — Ambulatory Visit (AMBULATORY_SURGERY_CENTER): Payer: Medicare HMO | Admitting: Internal Medicine

## 2019-08-14 VITALS — BP 113/63 | HR 71 | Temp 97.2°F | Resp 20 | Ht 71.5 in | Wt 144.0 lb

## 2019-08-14 DIAGNOSIS — I2510 Atherosclerotic heart disease of native coronary artery without angina pectoris: Secondary | ICD-10-CM | POA: Diagnosis not present

## 2019-08-14 DIAGNOSIS — R1319 Other dysphagia: Secondary | ICD-10-CM

## 2019-08-14 DIAGNOSIS — K222 Esophageal obstruction: Secondary | ICD-10-CM | POA: Diagnosis not present

## 2019-08-14 DIAGNOSIS — K449 Diaphragmatic hernia without obstruction or gangrene: Secondary | ICD-10-CM | POA: Diagnosis not present

## 2019-08-14 DIAGNOSIS — R1310 Dysphagia, unspecified: Secondary | ICD-10-CM | POA: Diagnosis not present

## 2019-08-14 DIAGNOSIS — Z932 Ileostomy status: Secondary | ICD-10-CM | POA: Diagnosis not present

## 2019-08-14 DIAGNOSIS — K219 Gastro-esophageal reflux disease without esophagitis: Secondary | ICD-10-CM | POA: Diagnosis not present

## 2019-08-14 DIAGNOSIS — I4891 Unspecified atrial fibrillation: Secondary | ICD-10-CM | POA: Diagnosis not present

## 2019-08-14 DIAGNOSIS — I10 Essential (primary) hypertension: Secondary | ICD-10-CM | POA: Diagnosis not present

## 2019-08-14 MED ORDER — SODIUM CHLORIDE 0.9 % IV SOLN: 500.0000 mL | Freq: Once | INTRAVENOUS | Status: DC

## 2019-08-14 MED ORDER — FAMOTIDINE 40 MG PO TABS: 40.0000 mg | ORAL_TABLET | Freq: Two times a day (BID) | ORAL | 3 refills | Status: AC

## 2019-08-14 NOTE — Progress Notes (Signed)
Report to PACU, RN, vss, BBS= Clear.  

## 2019-08-14 NOTE — Patient Instructions (Addendum)
I dilated the esophagus to 18 mm.  You should swallow better.  This is likely to happen again if we cannot get you on a stronger acid blocker.  Need to clear up if omeprazole and/or pantoprazole both caused blood in the urine, as those medications will do a better job of keeping the esophagus open.  In the meantime take famotidine 40 mg twice a day  Call soon and make an appointment to see me in June.  Restart Eliquis tomorrow.  I appreciate the opportunity to care for you. Gatha Mayer, MD, FACG   YOU HAD AN ENDOSCOPIC PROCEDURE TODAY AT [facility]:   Refer to the procedure report that was given to you for any specific questions about what was found during the examination.  If the procedure report does not answer your questions, please call your gastroenterologist to clarify.  If you requested that your care partner not be given the details of your procedure findings, then the procedure report has been included in a sealed envelope for you to review at your convenience later.  **Handout given on post dilation diet**  YOU SHOULD EXPECT: Some feelings of bloating in the abdomen. Passage of more gas than usual.  Walking can help get rid of the air that was put into your GI tract during the procedure and reduce the bloating. If you had a lower endoscopy (such as a colonoscopy or flexible sigmoidoscopy) you may notice spotting of blood in your stool or on the toilet paper. If you underwent a bowel prep for your procedure, you may not have a normal bowel movement for a few days.  Please Note:  You might notice some irritation and congestion in your nose or some drainage.  This is from the oxygen used during your procedure.  There is no need for concern and it should clear up in a day or so.  SYMPTOMS TO REPORT IMMEDIATELY:   Following upper endoscopy (EGD)  Vomiting of blood or coffee ground material  New chest pain or pain under the shoulder blades  Painful or  persistently difficult swallowing  New shortness of breath  Fever of 100F or higher  Black, tarry-looking stools  For urgent or emergent issues, a gastroenterologist can be reached at any hour by calling [phone]. Do not use MyChart messaging for urgent concerns.    DIET:  We do recommend a small meal at first, but then you may proceed to your regular diet.  Drink plenty of fluids but you should avoid alcoholic beverages for 24 hours.  ACTIVITY:  You should plan to take it easy for the rest of today and you should NOT DRIVE or use heavy machinery until tomorrow (because of the sedation medicines used during the test).    FOLLOW UP: Our staff will call the number listed on your records 48-72 hours following your procedure to check on you and address any questions or concerns that you may have regarding the information given to you following your procedure. If we do not reach you, we will leave a message.  We will attempt to reach you two times.  During this call, we will ask if you have developed any symptoms of COVID 19. If you develop any symptoms (ie: fever, flu-like symptoms, shortness of breath, cough etc.) before then, please call [phone].  If you test positive for Covid 19 in the 2 weeks post procedure, please call and report this information to [place].    If any biopsies were  taken you will be contacted by phone or by letter within the next 1-3 weeks.  Please call us at [phone] if you have not heard about the biopsies in 3 weeks.    SIGNATURES/CONFIDENTIALITY: You and/or your care partner have signed paperwork which will be entered into your electronic medical record.  These signatures attest to the fact that that the information above on your After Visit Summary has been reviewed and is understood.  Full responsibility of the confidentiality of this discharge information lies with you and/or your care-partner.

## 2019-08-14 NOTE — Op Note (Signed)
[facility] Patient Name: Parke Jandreau Procedure Date: 08/14/2019 2:04 PM MRN: [id number] Endoscopist: Gatha Mayer , MD Age: 86 Referring MD:  Date of Birth: Jan 06, 1932 Gender: Male Account #: [id number] Procedure:                Upper GI endoscopy Indications:              Dysphagia, Stricture of the esophagus, For therapy                            of esophageal stricture Medicines:                Propofol per Anesthesia, Monitored Anesthesia Care Procedure:                Pre-Anesthesia Assessment:                           - Prior to the procedure, a History and Physical                            was performed, and patient medications and                            allergies were reviewed. The patient's tolerance of                            previous anesthesia was also reviewed. The risks                            and benefits of the procedure and the sedation                            options and risks were discussed with the patient.                            All questions were answered, and informed consent                            was obtained. Prior Anticoagulants: The patient                            last took Eliquis (apixaban) 2 days prior to the                            procedure. ASA Grade Assessment: III - A patient                            with severe systemic disease. After reviewing the                            risks and benefits, the patient was deemed in                            satisfactory condition to undergo the procedure.  After obtaining informed consent, the endoscope was                            passed under direct vision. Throughout the                            procedure, the patient's blood pressure, pulse, and                            oxygen saturations were monitored continuously. The                            Endoscope was introduced through the mouth, and                            advanced to  the second part of duodenum. The upper                            GI endoscopy was accomplished without difficulty.                            The patient tolerated the procedure well. Scope In: Scope Out: Findings:                 One benign-appearing, intrinsic moderate stenosis                            was found at the gastroesophageal junction. This                            stenosis measured 1.4 cm (inner diameter). The                            stenosis was traversed. A TTS dilator was passed                            through the scope. Dilation with a 16-17-18 mm                            balloon dilator was performed to 18 mm. The                            dilation site was examined and showed moderate                            improvement in luminal narrowing. Estimated blood                            loss was minimal.                           A 10 cm hiatal hernia was present.                           The  exam was otherwise without abnormality.Except                            tiny pre-pyloric erosion.                           The cardia and gastric fundus were normal on                            retroflexion. Complications:            No immediate complications. Estimated Blood Loss:     Estimated blood loss was minimal. Impression:               - Benign-appearing esophageal stenosis. Dilated.                           - 10 cm hiatal hernia.                           - The examination was otherwise normal. Exept tiny                            pre-pyloric erosion.                           - No specimens collected. Recommendation:           - Patient has a contact number available for                            emergencies. The signs and symptoms of potential                            delayed complications were discussed with the                            patient. Return to normal activities tomorrow.                            Written discharge instructions  were provided to the                            patient.                           - Clear liquids x 1 hour then soft foods rest of                            day. Start prior diet tomorrow.                           - Continue present medications.                           - Increase Pepcid to 40 mg bid  patient to call soon and arrange follow-up with me                            in about 1 month                           if we could use PPI that would be better and reduce                            chance of stricture formation but ? of PPI's                            causing hematuria                           - Resume Eliquis (apixaban) at prior dose tomorrow. Gatha Mayer, MD 08/14/2019 2:38:10 PM This report has been signed electronically.

## 2019-08-14 NOTE — Progress Notes (Signed)
Pt's states no medical or surgical changes since previsit or office visit. 

## 2019-08-14 NOTE — Progress Notes (Signed)
Called to room to assist during endoscopic procedure.  Patient ID and intended procedure confirmed with present staff. Received instructions for my participation in the procedure from the performing physician.  

## 2019-08-16 ENCOUNTER — Telehealth: Payer: Self-pay

## 2019-08-16 NOTE — Telephone Encounter (Signed)
Attempted to reach patient for post-procedure f/u call. No answer. Left message for him to please not hesitate to call us if he has any questions/concerns regarding his care. 

## 2019-08-16 NOTE — Telephone Encounter (Signed)
No VM so unable to leave message on follow up call.

## 2019-08-24 ENCOUNTER — Encounter (HOSPITAL_COMMUNITY): Payer: Self-pay | Admitting: Emergency Medicine

## 2019-08-24 ENCOUNTER — Other Ambulatory Visit: Payer: Self-pay

## 2019-08-24 ENCOUNTER — Inpatient Hospital Stay (HOSPITAL_COMMUNITY)
Admission: EM | Admit: 2019-08-24 | Discharge: 2019-08-30 | DRG: 054 | Disposition: A | Discharge status: Hospice | Payer: Medicare HMO | Attending: Internal Medicine | Admitting: Internal Medicine

## 2019-08-24 ENCOUNTER — Inpatient Hospital Stay (HOSPITAL_COMMUNITY): Payer: Medicare HMO

## 2019-08-24 ENCOUNTER — Emergency Department (HOSPITAL_COMMUNITY): Payer: Medicare HMO

## 2019-08-24 DIAGNOSIS — I10 Essential (primary) hypertension: Secondary | ICD-10-CM | POA: Diagnosis present

## 2019-08-24 DIAGNOSIS — I340 Nonrheumatic mitral (valve) insufficiency: Secondary | ICD-10-CM | POA: Diagnosis not present

## 2019-08-24 DIAGNOSIS — I5189 Other ill-defined heart diseases: Secondary | ICD-10-CM | POA: Diagnosis not present

## 2019-08-24 DIAGNOSIS — Z9079 Acquired absence of other genital organ(s): Secondary | ICD-10-CM

## 2019-08-24 DIAGNOSIS — J90 Pleural effusion, not elsewhere classified: Secondary | ICD-10-CM | POA: Diagnosis not present

## 2019-08-24 DIAGNOSIS — I482 Chronic atrial fibrillation, unspecified: Secondary | ICD-10-CM | POA: Diagnosis not present

## 2019-08-24 DIAGNOSIS — M2550 Pain in unspecified joint: Secondary | ICD-10-CM | POA: Diagnosis not present

## 2019-08-24 DIAGNOSIS — R29898 Other symptoms and signs involving the musculoskeletal system: Secondary | ICD-10-CM | POA: Diagnosis present

## 2019-08-24 DIAGNOSIS — C3411 Malignant neoplasm of upper lobe, right bronchus or lung: Secondary | ICD-10-CM | POA: Diagnosis not present

## 2019-08-24 DIAGNOSIS — I6523 Occlusion and stenosis of bilateral carotid arteries: Secondary | ICD-10-CM | POA: Diagnosis present

## 2019-08-24 DIAGNOSIS — I4891 Unspecified atrial fibrillation: Secondary | ICD-10-CM | POA: Diagnosis not present

## 2019-08-24 DIAGNOSIS — I639 Cerebral infarction, unspecified: Secondary | ICD-10-CM

## 2019-08-24 DIAGNOSIS — C7802 Secondary malignant neoplasm of left lung: Secondary | ICD-10-CM | POA: Diagnosis present

## 2019-08-24 DIAGNOSIS — R29818 Other symptoms and signs involving the nervous system: Secondary | ICD-10-CM | POA: Diagnosis not present

## 2019-08-24 DIAGNOSIS — Z87891 Personal history of nicotine dependence: Secondary | ICD-10-CM

## 2019-08-24 DIAGNOSIS — I739 Peripheral vascular disease, unspecified: Secondary | ICD-10-CM | POA: Diagnosis not present

## 2019-08-24 DIAGNOSIS — Z20822 Contact with and (suspected) exposure to covid-19: Secondary | ICD-10-CM | POA: Diagnosis not present

## 2019-08-24 DIAGNOSIS — M50221 Other cervical disc displacement at C4-C5 level: Secondary | ICD-10-CM | POA: Diagnosis not present

## 2019-08-24 DIAGNOSIS — R54 Age-related physical debility: Secondary | ICD-10-CM | POA: Diagnosis present

## 2019-08-24 DIAGNOSIS — J309 Allergic rhinitis, unspecified: Secondary | ICD-10-CM | POA: Diagnosis present

## 2019-08-24 DIAGNOSIS — M47812 Spondylosis without myelopathy or radiculopathy, cervical region: Secondary | ICD-10-CM | POA: Diagnosis not present

## 2019-08-24 DIAGNOSIS — Z8673 Personal history of transient ischemic attack (TIA), and cerebral infarction without residual deficits: Secondary | ICD-10-CM

## 2019-08-24 DIAGNOSIS — Z9889 Other specified postprocedural states: Secondary | ICD-10-CM

## 2019-08-24 DIAGNOSIS — D72829 Elevated white blood cell count, unspecified: Secondary | ICD-10-CM | POA: Diagnosis present

## 2019-08-24 DIAGNOSIS — I2109 ST elevation (STEMI) myocardial infarction involving other coronary artery of anterior wall: Secondary | ICD-10-CM | POA: Diagnosis not present

## 2019-08-24 DIAGNOSIS — I350 Nonrheumatic aortic (valve) stenosis: Secondary | ICD-10-CM | POA: Diagnosis not present

## 2019-08-24 DIAGNOSIS — Z6820 Body mass index (BMI) 20.0-20.9, adult: Secondary | ICD-10-CM

## 2019-08-24 DIAGNOSIS — R079 Chest pain, unspecified: Secondary | ICD-10-CM | POA: Diagnosis not present

## 2019-08-24 DIAGNOSIS — I129 Hypertensive chronic kidney disease with stage 1 through stage 4 chronic kidney disease, or unspecified chronic kidney disease: Secondary | ICD-10-CM | POA: Diagnosis present

## 2019-08-24 DIAGNOSIS — F29 Unspecified psychosis not due to a substance or known physiological condition: Secondary | ICD-10-CM | POA: Diagnosis not present

## 2019-08-24 DIAGNOSIS — R0902 Hypoxemia: Secondary | ICD-10-CM | POA: Diagnosis not present

## 2019-08-24 DIAGNOSIS — Z7901 Long term (current) use of anticoagulants: Secondary | ICD-10-CM

## 2019-08-24 DIAGNOSIS — D509 Iron deficiency anemia, unspecified: Secondary | ICD-10-CM | POA: Diagnosis present

## 2019-08-24 DIAGNOSIS — J9811 Atelectasis: Secondary | ICD-10-CM | POA: Diagnosis not present

## 2019-08-24 DIAGNOSIS — Z85828 Personal history of other malignant neoplasm of skin: Secondary | ICD-10-CM

## 2019-08-24 DIAGNOSIS — R1310 Dysphagia, unspecified: Secondary | ICD-10-CM | POA: Diagnosis present

## 2019-08-24 DIAGNOSIS — Z7982 Long term (current) use of aspirin: Secondary | ICD-10-CM

## 2019-08-24 DIAGNOSIS — Z906 Acquired absence of other parts of urinary tract: Secondary | ICD-10-CM

## 2019-08-24 DIAGNOSIS — Z79899 Other long term (current) drug therapy: Secondary | ICD-10-CM

## 2019-08-24 DIAGNOSIS — N184 Chronic kidney disease, stage 4 (severe): Secondary | ICD-10-CM | POA: Diagnosis present

## 2019-08-24 DIAGNOSIS — Z7401 Bed confinement status: Secondary | ICD-10-CM | POA: Diagnosis not present

## 2019-08-24 DIAGNOSIS — Z8249 Family history of ischemic heart disease and other diseases of the circulatory system: Secondary | ICD-10-CM

## 2019-08-24 DIAGNOSIS — Z932 Ileostomy status: Secondary | ICD-10-CM | POA: Diagnosis not present

## 2019-08-24 DIAGNOSIS — C7801 Secondary malignant neoplasm of right lung: Secondary | ICD-10-CM | POA: Diagnosis present

## 2019-08-24 DIAGNOSIS — E785 Hyperlipidemia, unspecified: Secondary | ICD-10-CM | POA: Diagnosis present

## 2019-08-24 DIAGNOSIS — Y900 Blood alcohol level of less than 20 mg/100 ml: Secondary | ICD-10-CM | POA: Diagnosis present

## 2019-08-24 DIAGNOSIS — R0789 Other chest pain: Secondary | ICD-10-CM | POA: Diagnosis not present

## 2019-08-24 DIAGNOSIS — K8020 Calculus of gallbladder without cholecystitis without obstruction: Secondary | ICD-10-CM | POA: Diagnosis not present

## 2019-08-24 DIAGNOSIS — E43 Unspecified severe protein-calorie malnutrition: Secondary | ICD-10-CM | POA: Diagnosis not present

## 2019-08-24 DIAGNOSIS — M4802 Spinal stenosis, cervical region: Secondary | ICD-10-CM | POA: Diagnosis not present

## 2019-08-24 DIAGNOSIS — C689 Malignant neoplasm of urinary organ, unspecified: Secondary | ICD-10-CM | POA: Diagnosis not present

## 2019-08-24 DIAGNOSIS — Z7289 Other problems related to lifestyle: Secondary | ICD-10-CM

## 2019-08-24 DIAGNOSIS — Z66 Do not resuscitate: Secondary | ICD-10-CM | POA: Diagnosis not present

## 2019-08-24 DIAGNOSIS — K449 Diaphragmatic hernia without obstruction or gangrene: Secondary | ICD-10-CM | POA: Diagnosis present

## 2019-08-24 DIAGNOSIS — C7931 Secondary malignant neoplasm of brain: Secondary | ICD-10-CM | POA: Diagnosis not present

## 2019-08-24 DIAGNOSIS — G9389 Other specified disorders of brain: Secondary | ICD-10-CM | POA: Diagnosis not present

## 2019-08-24 DIAGNOSIS — Z951 Presence of aortocoronary bypass graft: Secondary | ICD-10-CM

## 2019-08-24 DIAGNOSIS — N183 Chronic kidney disease, stage 3 unspecified: Secondary | ICD-10-CM | POA: Diagnosis present

## 2019-08-24 DIAGNOSIS — Z888 Allergy status to other drugs, medicaments and biological substances status: Secondary | ICD-10-CM

## 2019-08-24 DIAGNOSIS — R0689 Other abnormalities of breathing: Secondary | ICD-10-CM | POA: Diagnosis not present

## 2019-08-24 DIAGNOSIS — C679 Malignant neoplasm of bladder, unspecified: Secondary | ICD-10-CM | POA: Diagnosis present

## 2019-08-24 DIAGNOSIS — K5730 Diverticulosis of large intestine without perforation or abscess without bleeding: Secondary | ICD-10-CM | POA: Diagnosis not present

## 2019-08-24 DIAGNOSIS — J948 Other specified pleural conditions: Secondary | ICD-10-CM | POA: Diagnosis not present

## 2019-08-24 DIAGNOSIS — G8324 Monoplegia of upper limb affecting left nondominant side: Secondary | ICD-10-CM | POA: Diagnosis present

## 2019-08-24 DIAGNOSIS — Z8551 Personal history of malignant neoplasm of bladder: Secondary | ICD-10-CM

## 2019-08-24 DIAGNOSIS — I2510 Atherosclerotic heart disease of native coronary artery without angina pectoris: Secondary | ICD-10-CM | POA: Diagnosis present

## 2019-08-24 DIAGNOSIS — I700 Atherosclerosis of aorta: Secondary | ICD-10-CM | POA: Diagnosis present

## 2019-08-24 DIAGNOSIS — R000 Tachycardia, unspecified: Secondary | ICD-10-CM | POA: Diagnosis not present

## 2019-08-24 DIAGNOSIS — R918 Other nonspecific abnormal finding of lung field: Secondary | ICD-10-CM | POA: Diagnosis not present

## 2019-08-24 LAB — I-STAT CHEM 8, ED
BUN: 46 mg/dL — ABNORMAL HIGH (ref 8–23)
Calcium, Ion: 1.21 mmol/L (ref 1.15–1.40)
Chloride: 99 mmol/L (ref 98–111)
Creatinine, Ser: 1.8 mg/dL — ABNORMAL HIGH (ref 0.61–1.24)
Glucose, Bld: 89 mg/dL (ref 70–99)
HCT: 46 % (ref 39.0–52.0)
Hemoglobin: 15.6 g/dL (ref 13.0–17.0)
Potassium: 4.4 mmol/L (ref 3.5–5.1)
Sodium: 136 mmol/L (ref 135–145)
TCO2: 29 mmol/L (ref 22–32)

## 2019-08-24 LAB — COMPREHENSIVE METABOLIC PANEL
ALT: 8 U/L (ref 0–44)
AST: 17 U/L (ref 15–41)
Albumin: 2.5 g/dL — ABNORMAL LOW (ref 3.5–5.0)
Alkaline Phosphatase: 70 U/L (ref 38–126)
Anion gap: 11 (ref 5–15)
BUN: 37 mg/dL — ABNORMAL HIGH (ref 8–23)
CO2: 26 mmol/L (ref 22–32)
Calcium: 9.7 mg/dL (ref 8.9–10.3)
Chloride: 100 mmol/L (ref 98–111)
Creatinine, Ser: 2.08 mg/dL — ABNORMAL HIGH (ref 0.61–1.24)
GFR calc Af Amer: 32 mL/min — ABNORMAL LOW (ref >60)
GFR calc non Af Amer: 28 mL/min — ABNORMAL LOW (ref >60)
Glucose, Bld: 98 mg/dL (ref 70–99)
Potassium: 4.3 mmol/L (ref 3.5–5.1)
Sodium: 137 mmol/L (ref 135–145)
Total Bilirubin: 1.5 mg/dL — ABNORMAL HIGH (ref 0.3–1.2)
Total Protein: 6 g/dL — ABNORMAL LOW (ref 6.5–8.1)

## 2019-08-24 LAB — ECHOCARDIOGRAM COMPLETE
Height: 71 in
Weight: 2304 oz

## 2019-08-24 LAB — RAPID URINE DRUG SCREEN, HOSP PERFORMED
Amphetamines: NOT DETECTED
Barbiturates: NOT DETECTED
Benzodiazepines: NOT DETECTED
Cocaine: NOT DETECTED
Opiates: NOT DETECTED
Tetrahydrocannabinol: NOT DETECTED

## 2019-08-24 LAB — DIFFERENTIAL
Abs Immature Granulocytes: 0.09 10*3/uL — ABNORMAL HIGH (ref 0.00–0.07)
Basophils Absolute: 0.1 10*3/uL (ref 0.0–0.1)
Basophils Relative: 1 %
Eosinophils Absolute: 1.4 10*3/uL — ABNORMAL HIGH (ref 0.0–0.5)
Eosinophils Relative: 11 %
Immature Granulocytes: 1 %
Lymphocytes Relative: 8 %
Lymphs Abs: 1.1 10*3/uL (ref 0.7–4.0)
Monocytes Absolute: 1.1 10*3/uL — ABNORMAL HIGH (ref 0.1–1.0)
Monocytes Relative: 9 %
Neutro Abs: 9 10*3/uL — ABNORMAL HIGH (ref 1.7–7.7)
Neutrophils Relative %: 70 %

## 2019-08-24 LAB — CBC
HCT: 44.9 % (ref 39.0–52.0)
Hemoglobin: 14.5 g/dL (ref 13.0–17.0)
MCH: 29.5 pg (ref 26.0–34.0)
MCHC: 32.3 g/dL (ref 30.0–36.0)
MCV: 91.3 fL (ref 80.0–100.0)
Platelets: 381 10*3/uL (ref 150–400)
RBC: 4.92 MIL/uL (ref 4.22–5.81)
RDW: 12.7 % (ref 11.5–15.5)
WBC: 12.7 10*3/uL — ABNORMAL HIGH (ref 4.0–10.5)
nRBC: 0 % (ref 0.0–0.2)

## 2019-08-24 LAB — LIPID PANEL
Cholesterol: 114 mg/dL (ref 0–200)
HDL: 45 mg/dL (ref >40)
LDL Cholesterol: 47 mg/dL (ref 0–99)
Total CHOL/HDL Ratio: 2.5 RATIO
Triglycerides: 110 mg/dL (ref <150)
VLDL: 22 mg/dL (ref 0–40)

## 2019-08-24 LAB — APTT: aPTT: 35 seconds (ref 24–36)

## 2019-08-24 LAB — HEMOGLOBIN A1C
Hgb A1c MFr Bld: 6.1 % — ABNORMAL HIGH (ref 4.8–5.6)
Mean Plasma Glucose: 128.37 mg/dL

## 2019-08-24 LAB — PROTIME-INR
INR: 1.2 (ref 0.8–1.2)
Prothrombin Time: 14.9 seconds (ref 11.4–15.2)

## 2019-08-24 LAB — TSH: TSH: 0.031 u[IU]/mL — ABNORMAL LOW (ref 0.350–4.500)

## 2019-08-24 LAB — SARS CORONAVIRUS 2 BY RT PCR (HOSPITAL ORDER, PERFORMED IN ~~LOC~~ HOSPITAL LAB): SARS Coronavirus 2: NEGATIVE

## 2019-08-24 LAB — ETHANOL: Alcohol, Ethyl (B): <10 mg/dL (ref <10)

## 2019-08-24 MED ORDER — IOHEXOL 9 MG/ML PO SOLN
500.0000 mL | ORAL | Status: AC
  Administered 2019-08-24 (×2): 500 mL via ORAL

## 2019-08-24 MED ORDER — ACETAMINOPHEN 160 MG/5ML PO SOLN: 650.0000 mg | ORAL | Status: DC | PRN

## 2019-08-24 MED ORDER — ALLOPURINOL 100 MG PO TABS
100.0000 mg | ORAL_TABLET | ORAL | Status: DC
  Administered 2019-08-24 – 2019-08-30 (×4): 100 mg via ORAL
  Filled 2019-08-24 (×4): qty 1

## 2019-08-24 MED ORDER — APIXABAN 2.5 MG PO TABS
2.5000 mg | ORAL_TABLET | Freq: Two times a day (BID) | ORAL | Status: DC
  Administered 2019-08-24 – 2019-08-25 (×3): 2.5 mg via ORAL
  Filled 2019-08-24 (×4): qty 1

## 2019-08-24 MED ORDER — ACETAMINOPHEN 650 MG RE SUPP: 650.0000 mg | RECTAL | Status: DC | PRN

## 2019-08-24 MED ORDER — ASPIRIN EC 81 MG PO TBEC
81.0000 mg | DELAYED_RELEASE_TABLET | Freq: Every day | ORAL | Status: DC
  Administered 2019-08-24 – 2019-08-30 (×7): 81 mg via ORAL
  Filled 2019-08-24 (×7): qty 1

## 2019-08-24 MED ORDER — ENSURE ENLIVE PO LIQD
237.0000 mL | Freq: Three times a day (TID) | ORAL | Status: DC
  Administered 2019-08-24 – 2019-08-28 (×9): 237 mL via ORAL

## 2019-08-24 MED ORDER — ACETAMINOPHEN 325 MG PO TABS
650.0000 mg | ORAL_TABLET | ORAL | Status: DC | PRN
  Administered 2019-08-24: 650 mg via ORAL
  Filled 2019-08-24: qty 2

## 2019-08-24 MED ORDER — POLYETHYLENE GLYCOL 3350 17 G PO PACK: 17.0000 g | PACK | Freq: Every day | ORAL | Status: DC | PRN

## 2019-08-24 MED ORDER — FAMOTIDINE 20 MG PO TABS
40.0000 mg | ORAL_TABLET | Freq: Two times a day (BID) | ORAL | Status: DC
  Administered 2019-08-24 – 2019-08-30 (×13): 40 mg via ORAL
  Filled 2019-08-24 (×14): qty 2

## 2019-08-24 MED ORDER — ROSUVASTATIN CALCIUM 5 MG PO TABS
5.0000 mg | ORAL_TABLET | Freq: Every day | ORAL | Status: DC
  Administered 2019-08-24 – 2019-08-30 (×7): 5 mg via ORAL
  Filled 2019-08-24 (×7): qty 1

## 2019-08-24 MED ORDER — SODIUM CHLORIDE 0.9 % IV SOLN: INTRAVENOUS | Status: DC

## 2019-08-24 MED ORDER — ADULT MULTIVITAMIN W/MINERALS CH
1.0000 | ORAL_TABLET | Freq: Every day | ORAL | Status: DC
  Administered 2019-08-24 – 2019-08-30 (×7): 1 via ORAL
  Filled 2019-08-24 (×7): qty 1

## 2019-08-24 MED ORDER — STROKE: EARLY STAGES OF RECOVERY BOOK
Freq: Once | Status: DC
  Filled 2019-08-24 (×2): qty 1

## 2019-08-24 NOTE — Progress Notes (Addendum)
STROKE TEAM PROGRESS NOTE   INTERVAL HISTORY Son at the bedside. Pt lying in bed, awake alert and orientated. He stated that he had afib and was on eliquis 5mg  bid before but changed to 2.5mg  bid in 07/2019 by his cardiologist Dr. Rockey Situ. He had EGD 10 days ago for hiatal hernia for which he was off eliquis for 3 days and now back on it for the last week and he is compliant with medication. His left arm and hand weakness has improved overnight as per pt.   Vitals:   08/24/19 0515 08/24/19 0600 08/24/19 0611 08/24/19 0944  BP: (!) 130/94 133/67 133/67 (!) 125/93  Pulse:  81 81 85  Resp: (!) 26 (!) 32 17 18  Temp:      TempSrc:      SpO2:  95% 96% 95%  Weight:      Height:        CBC:  Recent Labs  Lab 08/24/19 0422 08/24/19 0431  WBC 12.7*  --   NEUTROABS 9.0*  --   HGB 14.5 15.6  HCT 44.9 46.0  MCV 91.3  --   PLT 381  --     Basic Metabolic Panel:  Recent Labs  Lab 08/24/19 0422 08/24/19 0431  NA 137 136  K 4.3 4.4  CL 100 99  CO2 26  --   GLUCOSE 98 89  BUN 37* 46*  CREATININE 2.08* 1.80*  CALCIUM 9.7  --    Lipid Panel:     Component Value Date/Time   CHOL 114 08/24/2019 1000   CHOL 206 (H) 07/11/2013 0829   TRIG 110 08/24/2019 1000   HDL 45 08/24/2019 1000   HDL 68 07/11/2013 0829   CHOLHDL 2.5 08/24/2019 1000   VLDL 22 08/24/2019 1000   LDLCALC 47 08/24/2019 1000   LDLCALC 88 07/11/2013 0829   HgbA1c:  Lab Results  Component Value Date   HGBA1C 6.1 (H) 08/24/2019   Urine Drug Screen: No results found for: LABOPIA, COCAINSCRNUR, LABBENZ, AMPHETMU, THCU, LABBARB  Alcohol Level     Component Value Date/Time   ETH <10 08/24/2019 0422    IMAGING past 24 hours MR BRAIN WO CONTRAST  Result Date: 08/24/2019 CLINICAL DATA:  Focal neuro deficit, greater than 6 hours, stroke suspected. Additional history provided: Left-sided arm weakness, chest tightness, known atrial fibrillation. EXAM: MRI HEAD WITHOUT CONTRAST TECHNIQUE: Multiplanar, multiecho pulse  sequences of the brain and surrounding structures were obtained without intravenous contrast. COMPARISON:  Noncontrast head CT 08/24/2019 FINDINGS: Brain: Prominent susceptibility artifact arising from the face region obscures the portions of the right orbit, face and intracranial contents on multiple sequences. This limits evaluation. The susceptibility artifact appears to arise from a retained metallic foreign body within the forehead soft tissues as demonstrated on head CT performed earlier the same day. Due to marked susceptibility artifact the diffusion-weighted imaging is nondiagnostic and this precludes adequate evaluation for acute infarct. Redemonstrated 12 mm pedunculated mass within the body of the right lateral ventricle, arising from the lateral wall (series 7, image 16) (series 8, image 16). Mild patchy T2/FLAIR hyperintensity within the cerebral white matter is nonspecific, but consistent with chronic small vessel ischemic disease. Chronic lacunar infarcts within the right caudate head and within the cerebellum. Moderate generalized parenchymal atrophy. No chronic intracranial blood products are identified within described limitations. No extra-axial fluid collection is identified. No midline shift. Vascular: Expected proximal arterial flow voids. Skull and upper cervical spine: No focal marrow lesion is identified within  described limitations. Sinuses/Orbits: No acute orbital abnormality identified. No significant paranasal sinus disease within described limitations. No significant mastoid effusion. IMPRESSION: Marked susceptibility artifact arising from a retained metallic foreign body within the face obscures the intracranial contents on multiple sequences and significantly limits evaluation. The susceptibility artifact renders the diffusion-weighted imaging non-diagnostic and this precludes adequate evaluation for acute infarct. Redemonstrated 12 mm pedunculated mass within the body of the right  lateral ventricle. The mass is incompletely characterized on this non-contrast study. Nonemergent contrast-enhanced brain MRI is recommended for further evaluation. Chronic lacunar infarcts within the right basal ganglia and within the cerebellum. Mild chronic small vessel ischemic disease within the cerebral white matter. Moderate generalized parenchymal atrophy. Electronically Signed   By: Kellie Simmering DO   On: 08/24/2019 08:56   CT HEAD CODE STROKE WO CONTRAST  Result Date: 08/24/2019 CLINICAL DATA:  Code stroke.  Left arm weakness EXAM: CT HEAD WITHOUT CONTRAST TECHNIQUE: Contiguous axial images were obtained from the base of the skull through the vertex without intravenous contrast. COMPARISON:  None. FINDINGS: Brain: No evidence of acute infarction, hemorrhage, or hydrocephalus. Remote lacunar infarct at the right caudate head. Prominent extra-axial CSF spaces but no definite cortical mass effect for hygroma. There is an isodense mass the level of the body right lateral ventricle measuring 11 mm. Small likely remote right cerebellar infarct. Vascular: No hyperdense vessel or unexpected calcification. Skull: Normal. Negative for fracture or focal lesion. Sinuses/Orbits: No acute finding. Other: These results were communicated to Dr. Cheral Marker at [place] 5/21/2021by text page via the [facility] messaging system. ASPECTS  [facility] Stroke Program Early CT Score) - Ganglionic level infarction (caudate, lentiform nuclei, internal capsule, insula, M1-M3 cortex): 7 - Supraganglionic infarction (M4-M6 cortex): 3 Total score (0-10 with 10 being normal): 10 IMPRESSION: 1. No acute finding.  ASPECTS is 10. 2. 11 mm mass at the right lateral ventricle. 3. Remote appearing infarcts at the right caudate and cerebellum. Electronically Signed   By: Monte Fantasia M.D.   On: 08/24/2019 04:34    PHYSICAL EXAM  Temp:  [97.5 F (36.4 C)] 97.5 F (36.4 C) (05/21 0436) Pulse Rate:  [74-93] 89 (05/21 1200) Resp:  [16-32] 27  (05/21 1200) BP: (108-137)/(67-100) 137/100 (05/21 1200) SpO2:  [94 %-98 %] 94 % (05/21 1200) Weight:  [65.3 kg] 65.3 kg (05/21 0436)  General - Well nourished, well developed, in no apparent distress.  Ophthalmologic - fundi not visualized due to noncooperation.  Cardiovascular - irregularly irregular heart rate and rhythm.  Mental Status -  Level of arousal and orientation to time, place, and person were intact. Language including expression, naming, repetition, comprehension was assessed and found intact. Fund of Knowledge was assessed and was intact.  Cranial Nerves II - XII - II - Visual field intact OU. III, IV, VI - Extraocular movements intact. V - Facial sensation intact bilaterally. VII - Facial movement intact bilaterally. VIII - Hearing & vestibular intact bilaterally. X - Palate elevates symmetrically. XI - Chin turning & shoulder shrug intact bilaterally. XII - Tongue protrusion intact.  Motor Strength - The patient's strength was symmetrical in all extremities except pronator drift on the left and left wrist extension 4+/5 and finger grip 3/5 with significant dexterity diffeiculty.  Bulk was normal and fasciculations were absent.   Motor Tone - Muscle tone was assessed at the neck and appendages and was normal.  Reflexes - The patient's reflexes were symmetrical in all extremities and he had no pathological reflexes.  Sensory -  Light touch, temperature/pinprick were assessed and were symmetrical.    Coordination - The patient had normal movements in the right hands with no ataxia or dysmetria, but left FTN mild dysmetria and slow action but still proportional to his weakness.  Tremor was absent.  Gait and Station - deferred.   ASSESSMENT/PLAN [name]. is a 86 y.o. male with history of atrial fibrillation on eliquis, bladder cancer s/p resection with urostomy, CAD s/p CABG in 1983, bilateral carotid artery stenosis, CKD stage III, HTN, HLD and PVD  presenting with LUE weakness.   Stroke - possible small right MCA motor strip infarct - likely due to afib even on eliquis  Code Stroke CT head No acute abnormality. Old R caudate and cerebellar infarcts. ASPECTS 10.     MRI  nondiagnostic DWI due to artifact with metallic objects.  Old R basal ganglia and cerebellar lacunes. Small vessel disease.   Repeat CT in am  TCD  pending  Carotid Doppler  pending  2D Echo pending  LDL 47  HgbA1c 6.1  Eliquis for VTE prophylaxis  aspirin 81 mg daily and Eliquis (apixaban) daily prior to admission, now on aspirin 81 mg daily and Eliquis (apixaban) daily.   Therapy recommendations:  pending   Disposition:  pending   Atrial Fibrillation  Home anticoagulation:  Eliquis (apixaban) daily continued in the hospital  Now on eliquis 2.5mg  bid . Will decided further [facility] regimen based on CT repeat and Cre in am   ?? Carotid dz   R ICA 40-59%, L ICA 60-79% 2014   1-39% bilateral in 09/2017   CUS pending  Right ventricle mass  CT head 22mm mass R lateral ventricle.  MRI brain - R lateral ventricle w/ pedunculated mass.   Nonemergent contrast-enhanced brain MRI is recommended for further evaluation.  Hypertension  Stable . BP goal normotensive  Hyperlipidemia  Home meds:  crestor 5, resumed in hospital  LDL 47, goal < 70  Continue statin at discharge  Other Stroke Risk Factors  Advanced age  Former Cigarette smoker, quit 42 yrs ago  Former smokeless tobacco user  ETOH use, alcohol level <10, advised to drink no more than 2 drink(s) a day (curret 21 drinks/week)  Coronary artery disease s/p CABG 1983  PVD  Other Active Problems  Hx basal cell ca L cheek, R low back  Bladder cancer s/p resection with [facility] day # 0  Rosalin Hawking, MD PhD Stroke Neurology 08/24/2019 12:51 PM   To contact Stroke Continuity provider, please refer to [email or web address] After hours, contact General Neurology

## 2019-08-24 NOTE — Progress Notes (Signed)
Carotid artery duplex and TCD completed. Refer to "CV Proc" under chart review to view preliminary results.  08/24/2019 11:32 AM Kelby Aline., MHA, RVT, RDCS, RDMS

## 2019-08-24 NOTE — Progress Notes (Signed)
Patient arrived to unit. Tele connected. Patient reports no pain. Patient belongings with them. Call bell within reach.

## 2019-08-24 NOTE — ED Notes (Signed)
Lunch Tray Ordered @ 1055.

## 2019-08-24 NOTE — Code Documentation (Signed)
Responded to Code Stroke called at 0405 for L arm weakness, [id number] yesterday. Pt woke up at 0300 with L arm weakness. Per EMS, weakness got better en route and, on arrival, only L hand was weak. NIH-0, CBG-89, CT head negative for acute changes. Plan for MRI.

## 2019-08-24 NOTE — Progress Notes (Signed)
Initial Nutrition Assessment  DOCUMENTATION CODES:   Severe malnutrition in context of chronic illness  INTERVENTION:  Ensure Enlive po TID, each supplement provides 350 kcal and 20 grams of protein  Magic cup TID with meals, each supplement provides 290 kcal and 9 grams of protein  MVI daily  NUTRITION DIAGNOSIS:   Severe Malnutrition related to chronic illness as evidenced by percent weight loss, energy intake < or equal to 75% for > or equal to 1 month, severe muscle depletion, severe fat depletion.    GOAL:   Patient will meet greater than or equal to 90% of their needs    MONITOR:   PO intake, Supplement acceptance, Labs, Weight trends, I & O's  REASON FOR ASSESSMENT:   Consult Other (Comment)(stroke)  ASSESSMENT:   Pt with a PMH significant for bladder ca, CAD s/p CABG, carotid stenosis, CKD stage 3, Afib, HTN, HLD, and PVD admitted for stroke. Pt noted to have had an EGD on 5/11 and chronic dysphagia for a couple of weeks.   Pt states he has had a poor appetite since February, but unsure of the cause. Prior to his appetite being poor, he ate eggs, grits, a banana and two hash browns for breakfast, a deli meat sandwich for lunch, and meat and vegetables for dinner. Pt agreeable to drinking Ensure and trying Magic Cup.   Per wt readings, pt had had a 10.4% wt loss x3 months, which is significant for time frame.  No PO intake documented.  Labs and medications reviewed.  NUTRITION - FOCUSED PHYSICAL EXAM:    Most Recent Value  Orbital Region  Severe depletion  Upper Arm Region  Severe depletion  Thoracic and Lumbar Region  Severe depletion  Buccal Region  Severe depletion  Temple Region  Moderate depletion  Clavicle Bone Region  Severe depletion  Clavicle and Acromion Bone Region  Severe depletion  Scapular Bone Region  Severe depletion  Dorsal Hand  Severe depletion  Patellar Region  Severe depletion  Anterior Thigh Region  Severe depletion  Posterior  Calf Region  Severe depletion  Edema (RD Assessment)  None  Hair  Reviewed  Eyes  Reviewed  Mouth  Reviewed  Skin  Reviewed  Nails  Reviewed       Diet Order:   Diet Order            Diet Heart Room service appropriate? Yes; Fluid consistency: Thin  Diet effective ____              EDUCATION NEEDS:   No education needs have been identified at this time  Skin:  Skin Assessment: Reviewed RN Assessment  Last BM:  5/19  Height:   Ht Readings from Last 1 Encounters:  08/24/19 5\' 11"  (1.803 m)    Weight:   Wt Readings from Last 1 Encounters:  08/24/19 65.3 kg    BMI:  Body mass index is 20.08 kg/m.  Estimated Nutritional Needs:   Kcal:  2000-2200  Protein:  100-115 grams  Fluid:  >2L/d    Larkin Ina, MS, RD, LDN RD pager number and weekend/on-call pager number located in [place].

## 2019-08-24 NOTE — Progress Notes (Signed)
Patient had CT of C-spine ordered re: L hand weakness.  C-spine is unremarkable but apices of lungs with "innumerable nodules."  Concerning for metastatic bladder cancer.  Moderate L pleural effusion.  Needs CTA C/A/P.  Will order now.    Carlyon Shadow, M.D.
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
## 2019-08-24 NOTE — ED Triage Notes (Signed)
Pt arrives via [facility] stretcher. Code Stroke called in the field at [place]. Pt lives alone and went to bed previous night at approx 1900 without symptoms. Woke up at 0300 with L sided arm weakness and called 911 by himself. Reports chest tightness for two weeks, known a-fib currently only Eliquis. EMS did not note any other abnormalities including facial droop, slurred speech, L leg drift. On arrival to ED A+Ox4, able to move L arm with slight movement in hand/fingers.

## 2019-08-24 NOTE — Progress Notes (Signed)
  Echocardiogram 2D Echocardiogram has been performed.  [name] 08/24/2019, 11:40 AM
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
## 2019-08-24 NOTE — ED Notes (Signed)
Pt to MRI
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
## 2019-08-25 ENCOUNTER — Inpatient Hospital Stay (HOSPITAL_COMMUNITY): Payer: Medicare HMO

## 2019-08-25 DIAGNOSIS — R29898 Other symptoms and signs involving the musculoskeletal system: Secondary | ICD-10-CM

## 2019-08-25 DIAGNOSIS — I10 Essential (primary) hypertension: Secondary | ICD-10-CM

## 2019-08-25 DIAGNOSIS — E43 Unspecified severe protein-calorie malnutrition: Secondary | ICD-10-CM | POA: Insufficient documentation

## 2019-08-25 LAB — RENAL FUNCTION PANEL
Albumin: 2.3 g/dL — ABNORMAL LOW (ref 3.5–5.0)
Anion gap: 11 (ref 5–15)
BUN: 34 mg/dL — ABNORMAL HIGH (ref 8–23)
CO2: 22 mmol/L (ref 22–32)
Calcium: 9.5 mg/dL (ref 8.9–10.3)
Chloride: 101 mmol/L (ref 98–111)
Creatinine, Ser: 1.58 mg/dL — ABNORMAL HIGH (ref 0.61–1.24)
GFR calc Af Amer: 45 mL/min — ABNORMAL LOW (ref >60)
GFR calc non Af Amer: 39 mL/min — ABNORMAL LOW (ref >60)
Glucose, Bld: 102 mg/dL — ABNORMAL HIGH (ref 70–99)
Phosphorus: 2.9 mg/dL (ref 2.5–4.6)
Potassium: 4.2 mmol/L (ref 3.5–5.1)
Sodium: 134 mmol/L — ABNORMAL LOW (ref 135–145)

## 2019-08-25 LAB — CBC
HCT: 43 % (ref 39.0–52.0)
Hemoglobin: 14 g/dL (ref 13.0–17.0)
MCH: 29.2 pg (ref 26.0–34.0)
MCHC: 32.6 g/dL (ref 30.0–36.0)
MCV: 89.6 fL (ref 80.0–100.0)
Platelets: 344 10*3/uL (ref 150–400)
RBC: 4.8 MIL/uL (ref 4.22–5.81)
RDW: 12.5 % (ref 11.5–15.5)
WBC: 12.8 10*3/uL — ABNORMAL HIGH (ref 4.0–10.5)
nRBC: 0 % (ref 0.0–0.2)

## 2019-08-25 LAB — T4, FREE: Free T4: 2.49 ng/dL — ABNORMAL HIGH (ref 0.61–1.12)

## 2019-08-25 MED ORDER — FERROUS SULFATE 325 (65 FE) MG PO TABS
325.0000 mg | ORAL_TABLET | ORAL | Status: DC
  Administered 2019-08-25 – 2019-08-29 (×3): 325 mg via ORAL
  Filled 2019-08-25 (×4): qty 1

## 2019-08-25 MED ORDER — METOPROLOL SUCCINATE ER 25 MG PO TB24
25.0000 mg | ORAL_TABLET | Freq: Every day | ORAL | Status: DC
  Administered 2019-08-25 – 2019-08-30 (×6): 25 mg via ORAL
  Filled 2019-08-25 (×6): qty 1

## 2019-08-25 MED ORDER — HEPARIN SODIUM (PORCINE) 5000 UNIT/ML IJ SOLN
5000.0000 [IU] | Freq: Three times a day (TID) | INTRAMUSCULAR | Status: DC
  Administered 2019-08-25 – 2019-08-26 (×2): 5000 [IU] via SUBCUTANEOUS
  Filled 2019-08-25 (×2): qty 1

## 2019-08-25 NOTE — Consult Note (Signed)
[place] Telephone:(336) [phone]   Fax:([phone]) [place] NOTE  Patient Care Team: Tonia Ghent, MD as PCP - General Rockey Situ Kathlene November, MD as PCP - Cardiology (Cardiology) Minna Merritts, MD as Consulting Physician (Cardiology) Calvert Cantor, MD as Consulting Physician (Ophthalmology) Newt Minion, MD as Consulting Physician (Orthopedic Surgery) Alexis Frock, MD as Consulting Physician (Urology) Blenda Mounts, DDS as Referring Physician (Dentistry) [facility] (Dermatology)  Hematological/Oncological History # Newly Diagnosed Lung Mass with Brain Metastasis 1) 08/24/2019: presented to the Emergency department with  2) 08/24/2019: MRI brain demonstrated 12 mm pedunculated mass within the body of the right lateral ventricle. CT C/A/P showed innumerable subcentimeter noncalcified lung nodules scattered throughout both lungs, consistent with metastatic disease. 2. 5.2 cm x 3.0 cm patchy area of masslike consolidation within the posteromedial aspect of the left upper lobe, which may represent a primary neoplasm. 3) 08/25/2019: consult to Oncology service.   CHIEF COMPLAINTS/PURPOSE OF CONSULTATION:  "newly diagnosed lung met and brain metastasis"  HISTORY OF PRESENTING ILLNESS:  [name]. 86 y.o. male with medical history significant for CAD, GERD, HTN, and bladder cancer s/p urostomy who presents with left upper extremity weakness and was found to have a brain lesion and lung mass concerning for metastatic disease.   On review of the previous records [name] initially presented to the emergency department on 08/24/2019 with weakness in his left upper extremity.  Reportedly the patient fell asleep at approximately 5 PM from the television and when he awoke at around 3 AM he was not able to move his left hand.  He remained ambulatory with clear mental status.  On arrival to the emergency department the patient underwent a CT scan of his  head which showed no acute findings, though there were remote appearing infarcts in the right caudate and cerebellum.  Additionally there was noted to be an 11 mm mass in the right lateral ventricle.  In order to further characterize this an MRI brain without contrast was performed which demonstrated a 12 mm pedunculated mass within the body of the right lateral ventricle.  This mass was incompletely characterized due to the noncontrasted nature of the study.  To further evaluate a CT chest abdomen pelvis was performed which revealed a 5.2 x 3.0 patchy area of masslike consolidation within the posterior medial aspect of the left upper lobe which likely represents a primary neoplasm.  Additionally there were innumerable subcentimeter noncalcified lung nodules scattered throughout both lungs consistent with metastatic disease.  Due to concern for these findings the patient was referred to oncology for further evaluation management.  On exam today [name] notes that he is regaining function of his left hand.  He reports it is no longer numb and he is able to do some motor functions, with particular with the ability to squeeze.  He is aware of the findings on his chest CT and the MRI of his brain.  When I mention that these may be related to cancer he notes "I am not sure I want to do too much about it if that were the case".  He is amenable to receiving a biopsy to confirm the diagnosis.  Otherwise he does endorse being an active smoker for approximately 30 years and having quit in 1979.  He smoked approximately 1 to 2 packs/day. He does endorse having shortness of breath recently with a "a "constriction" in his chest. He endorses having lost approximately 20 pounds since  February and he has been having issues with fatigue.  A full 10 point ROS is listed below.  MEDICAL HISTORY:  Past Medical History:  Diagnosis Date   Acute osteomyelitis of toe of right foot ([facility]) 06/09/2014   Allergic rhinitis, cause  unspecified    Allergy    Anemia    Anxiety    Arthritis    left shoulder, left wrist, back   Basal cell carcinoma 09/27/2014   right upper paraspinal(txpbx),right forehead (txpbx)   BCC (basal cell carcinoma) 09/30/216   left cheek (txpbx), right lowback (txpbx)   Bladder cancer ([place])    Blood transfusion without reported diagnosis    CAD in native artery    a. s/p CABG 1983. b. 09/2008 - mild inferior ischemia. treated medically.    Calculus of gallbladder without mention of cholecystitis or obstruction    IVP- wnl   Carotid artery stenosis    a. 10-96% RICA, 04-54% LICA by duplex 12/8117.   Cataract    CKD (chronic kidney disease), stage III    Colon polyps    TUBULAR ADENOMA AND A HYPERPLASTIC POLYP   Cyst of eye    left eye, being monitored   Displacement of intervertebral disc, site unspecified, without myelopathy    L 4   Dysrhythmia    A-Fib   Esophageal stricture    Essential hypertension, benign    GERD (gastroesophageal reflux disease)    s/p EGD and dilation 2011 per Dr. Carlean Purl   H/O hiatal hernia    Heart murmur    Hematuria    Dr. Serita Butcher   Hemorrhoids    Hypertrophy of prostate without urinary obstruction and other lower urinary tract symptoms (LUTS)    Lightning attack 11/1998   Mild aortic stenosis by prior echocardiogram 10/2013   Echo 10/27/2013: mild LVH, EF 60-65%, GR 2 DD; Aortic Sclerosis / mild stenosis; Severe LAE, mild RAE; mild PA HTN - 40 mmHg   Other and unspecified hyperlipidemia    Peripheral vascular disease (H[facility])    PONV (postoperative nausea and vomiting)    Presence of urostomy ([facility]CC) 06/09/2014   Seasonal allergies    Skin cancer    skin cancer on face   Syncope    a. Prior h/o at time of foley placement.   Unspecified hearing loss    Vertigo     SURGICAL HISTORY: Past Surgical History:  Procedure Laterality Date   BACK SURGERY  1981   ruptured disk L-4   COLONOSCOPY W/ BIOPSIES      CORONARY ARTERY BYPASS GRAFT  1983   Dr. Redmond Pulling   CYSTOGRAM Left 09/03/2013   Procedure: CYSTOGRAM, left retrograde, attempted double J stent;  Surgeon: Ailene Rud, MD;  Location: [facility];  Service: Urology;  Laterality: Left;   CYSTOSCOPY N/A 10/26/2013   Procedure: CYSTOSCOPY WITH CLOT EVACUATION , INDOCYANINE GREEN DYE INJECTION;  Surgeon: Alexis Frock, MD;  Location: [facility];  Service: Urology;  Laterality: N/A;   ESOPHAGOGASTRODUODENOSCOPY     with stretching   ESOPHAGOGASTRODUODENOSCOPY N/A 01/23/2017   Procedure: ESOPHAGOGASTRODUODENOSCOPY (EGD);  Surgeon: Yetta Flock, MD;  Location: [facility];  Service: Gastroenterology;  Laterality: N/A;   INGUINAL HERNIA REPAIR Bilateral 10/24/2014   Procedure: BILATERAL INGUINAL HERNIA REPAIR WITH MESH;  Surgeon: Coralie Keens, MD;  Location: [facility];  Service: General;  Laterality: Bilateral;   [place]. "TUNA surgery"   ROBOT ASSISTED LAPAROSCOPIC COMPLETE CYSTECT ILEAL CONDUIT N/A 10/26/2013   Procedure: ROBOTIC  ASSISTED LAPAROSCOPIC CYSTECTOMY AND PROSTECTOMY WITH NODE DISSECTION,  ILEAL CONDUIT, INSERTION OF FACIAL PAIN PUMP;  Surgeon: Alexis Frock, MD;  Location: [facility];  Service: Urology;  Laterality: N/A;   SKIN CANCER EXCISION     TRANSURETHRAL NEEDLE ABLATION OF THE PROSTATE  11/1999   TRANSURETHRAL RESECTION OF BLADDER TUMOR N/A 09/03/2013   Procedure: TRANSURETHRAL RESECTION OF BLADDER TUMOR (TURBT);  Surgeon: Ailene Rud, MD;  Location: [facility];  Service: Urology;  Laterality: N/A;   TRANSURETHRAL RESECTION OF PROSTATE N/A 09/03/2013   Procedure: TRANSURETHRAL RESECTION OF THE PROSTATE WITH GYRUS INSTRUMENTS;  Surgeon: Ailene Rud, MD;  Location: [facility];  Service: Urology;  Laterality: N/A;    SOCIAL HISTORY: Social History   Socioeconomic History   Marital status: Married    Spouse name: Not on file   Number of children: 2   Years of education: Not on file    Highest education level: Not on file  Occupational History   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  Tobacco Use   Smoking status: Former Smoker    Packs/day: 3.00    Years: 29.00    Pack years: 87.00    Types: Cigarettes    Quit date: 04/05/1977    Years since quitting: 42.4   Smokeless tobacco: Former Systems developer    Types: Chew   Tobacco comment: quit 1979  Substance and Sexual Activity   Alcohol use: Yes    Alcohol/week: 21.0 standard drinks    Types: 21 Standard drinks or equivalent per week    Comment: 3-4 drinks a day    Drug use: No   Sexual activity: Never  Other Topics Concern   Not on file  Social History Narrative   Daily caffeine use- 2 per day   Enjoys working at home, has 25 acres- prev target shooting.   Widowed 2020, was married 1958   Children: 2 boys   Occupation: Retired from the [employer] '55-'57   alcohol- 3-4 at 'happy hour'   [place] from [place] and [place], then from [place] Strain: Low Risk    Difficulty of Paying Living Expenses: Not hard at [employer] Insecurity: No Food Insecurity   Worried About Charity fundraiser in the Last Year: Never true   Arboriculturist in the Last Year: Never true  Transportation Needs: No Transportation Needs   Lack of Transportation (Medical): No   Lack of Transportation (Non-Medical): No  Physical Activity: Inactive   Days of Exercise per Week: 0 days   Minutes of Exercise per Session: 0 min  Stress: No Stress Concern Present   Feeling of Stress : Not at all  Social Connections:    Frequency of Communication with Friends and Family:    Frequency of Social Gatherings with Friends and Family:    Attends Religious Services:    Active Member of Clubs or Organizations:    Attends Music therapist:    Marital Status:   Intimate Partner Violence: Not At Risk   Fear of Current or  Ex-Partner: No   Emotionally Abused: No   Physically Abused: No   Sexually Abused: No    FAMILY HISTORY: Family History  Problem Relation Age of Onset   Heart disease Mother    Hypertension Mother    Alcohol abuse Father    Heart disease Father    Depression Neg Hx  Diabetes Neg Hx    Stroke Neg Hx    Cancer Neg Hx        no prostate or colon cancer   Colon cancer Neg Hx    Prostate cancer Neg Hx     ALLERGIES:  is allergic to rivaroxaban; lovastatin; pantoprazole; proton pump inhibitors; and zetia [ezetimibe].  MEDICATIONS:  Current Facility-Administered Medications  Medication Dose Route Frequency Provider Last Rate Last Admin    stroke: mapping our early stages of recovery book   Does not apply Once Karmen Bongo, MD   Stopped at 08/24/19 1432   0.9 %  sodium chloride infusion   Intravenous Continuous Karmen Bongo, MD 50 mL/hr at 08/25/19 1210 New Bag at 08/25/19 1210   acetaminophen (TYLENOL) tablet 650 mg  650 mg Oral Q4H PRN Karmen Bongo, MD   650 mg at 08/24/19 2130   Or   acetaminophen (TYLENOL) 160 MG/5ML solution 650 mg  650 mg Per Tube Q4H PRN Karmen Bongo, MD       Or   acetaminophen (TYLENOL) suppository 650 mg  650 mg Rectal Q4H PRN Karmen Bongo, MD       allopurinol (ZYLOPRIM) tablet 100 mg  100 mg Oral Cathlean Sauer, MD   100 mg at 08/24/19 1039   aspirin EC tablet 81 mg  81 mg Oral Daily Karmen Bongo, MD   81 mg at 08/25/19 0850   famotidine (PEPCID) tablet 40 mg  40 mg Oral BID Karmen Bongo, MD   40 mg at 08/25/19 7846   feeding supplement (ENSURE ENLIVE) (ENSURE ENLIVE) liquid 237 mL  237 mL Oral TID BM Karmen Bongo, MD   237 mL at 08/25/19 1335   ferrous sulfate tablet 325 mg  325 mg Oral Irena Reichmann, MD   325 mg at 08/25/19 1330   heparin injection 5,000 Units  5,000 Units Subcutaneous Q8H Dahal, Binaya, MD       metoprolol succinate (TOPROL-XL) 24 hr tablet 25 mg  25 mg Oral Daily Dahal,  Marlowe Aschoff, MD   25 mg at 08/25/19 1330   multivitamin with minerals tablet 1 tablet  1 tablet Oral Daily Karmen Bongo, MD   1 tablet at 08/25/19 0850   polyethylene glycol (MIRALAX / GLYCOLAX) packet 17 g  17 g Oral Daily PRN Karmen Bongo, MD       rosuvastatin (CRESTOR) tablet 5 mg  5 mg Oral Daily Karmen Bongo, MD   5 mg at 08/25/19 9629    REVIEW OF SYSTEMS:   Constitutional: ( - ) fevers, ( - )  chills , ( - ) night sweats Eyes: ( - ) blurriness of vision, ( - ) double vision, ( - ) watery eyes Ears, nose, mouth, throat, and face: ( - ) mucositis, ( - ) sore throat Respiratory: ( - ) cough, ( - ) dyspnea, ( - ) wheezes Cardiovascular: ( - ) palpitation, ( - ) chest discomfort, ( - ) lower extremity swelling Gastrointestinal:  ( - ) nausea, ( - ) heartburn, ( - ) change in bowel habits Skin: ( - ) abnormal skin rashes Lymphatics: ( - ) new lymphadenopathy, ( - ) easy bruising Neurological: ( - ) numbness, ( - ) tingling, ( - ) new weaknesses Behavioral/Psych: ( - ) mood change, ( - ) new changes  All other systems were reviewed with the patient and are negative.  PHYSICAL EXAMINATION: ECOG PERFORMANCE STATUS: 2 - Symptomatic, <50% confined to bed  Vitals:   08/25/19 1214  08/25/19 1330  BP: (!) 149/107 (!) 166/80  Pulse: 67 88  Resp: (!) 21   Temp: 97.6 F (36.4 C)   SpO2: 94%    Filed Weights   08/24/19 0436  Weight: 144 lb (65.3 kg)    GENERAL: well appearing elderly Caucasian male in NAD  SKIN: skin color, texture, turgor are normal, no rashes or significant lesions EYES: conjunctiva are pink and non-injected, sclera clear LUNGS: clear to auscultation and percussion with normal breathing effort HEART: regular rate & rhythm and no murmurs and no lower extremity edema Musculoskeletal: no cyanosis of digits and no clubbing  PSYCH: alert & oriented x 3, fluent speech NEURO: no focal motor/sensory deficits  LABORATORY DATA:  I have reviewed the data as  listed CBC Latest Ref Rng & Units 08/25/2019 08/24/2019 08/24/2019  WBC 4.0 - 10.5 K/uL 12.8(H) - 12.7(H)  Hemoglobin 13.0 - 17.0 g/dL 14.0 15.6 14.5  Hematocrit 39.0 - 52.0 % 43.0 46.0 44.9  Platelets 150 - 400 K/uL 344 - 381    CMP Latest Ref Rng & Units 08/25/2019 08/24/2019 08/24/2019  Glucose 70 - 99 mg/dL 102(H) 89 98  BUN 8 - 23 mg/dL 34(H) 46(H) 37(H)  Creatinine 0.61 - 1.24 mg/dL 1.58(H) 1.80(H) 2.08(H)  Sodium 135 - 145 mmol/L 134(L) 136 137  Potassium 3.5 - 5.1 mmol/L 4.2 4.4 4.3  Chloride 98 - 111 mmol/L 101 99 100  CO2 22 - 32 mmol/L 22 - 26  Calcium 8.9 - 10.3 mg/dL 9.5 - 9.7  Total Protein 6.5 - 8.1 g/dL - - 6.0(L)  Total Bilirubin 0.3 - 1.2 mg/dL - - 1.5(H)  Alkaline Phos 38 - 126 U/L - - 70  AST 15 - 41 U/L - - 17  ALT 0 - 44 U/L - - 8    RADIOGRAPHIC STUDIES: I have personally reviewed the radiological images as listed and agreed with the findings in the report: lung mass with numerous metastatic lesions throughout the lungs. Brain lesion concerning for metastasis in the right ventricle, though it is in an unusual location .  CT ABDOMEN PELVIS WO CONTRAST  Result Date: 08/24/2019 CLINICAL DATA:  Urologic cancer, surveillance. EXAM: CT CHEST, ABDOMEN AND PELVIS WITHOUT CONTRAST TECHNIQUE: Multidetector CT imaging of the chest, abdomen and pelvis was performed following the standard protocol without IV contrast. COMPARISON:  None. FINDINGS: CT CHEST FINDINGS Cardiovascular: There is marked severity calcification of the aortic arch. Normal heart size. No pericardial effusion. Marked severity coronary artery calcification is seen. Mediastinum/Nodes: No enlarged mediastinal, hilar, or axillary lymph nodes. Thyroid gland, trachea, and esophagus demonstrate no significant findings. Lungs/Pleura: Innumerable subcentimeter noncalcified lung nodules are seen scattered throughout both lungs. A 5.2 cm x 3.0 cm patchy area of masslike consolidation is seen within the posteromedial aspect  of the left upper lobe. Small to moderate sized patchy areas of atelectasis and/or infiltrate are seen within the right upper lobe posteromedial right middle lobe and superior aspect of the right lower lobe. Mild atelectasis is seen along the posterior aspect of the left lung base. There is a moderate size left pleural effusion. A small right pleural effusion is also seen. No pneumothorax is identified. Musculoskeletal: Multiple sternal wires are seen. Degenerative changes seen within the thoracic spine. CT ABDOMEN PELVIS FINDINGS Hepatobiliary: No focal liver abnormality is seen. Multiple subcentimeter gallstones are seen within the lumen of an otherwise normal-appearing gallbladder. There is no evidence of gallbladder wall thickening or biliary dilatation. Pancreas: Unremarkable. No pancreatic ductal dilatation or surrounding  inflammatory changes. Spleen: Normal in size without focal abnormality. Adrenals/Urinary Tract: Adrenal glands are unremarkable. The left kidney is atrophic in appearance. A 1.3 cm cyst is seen within the lateral aspect of the mid left kidney. There is marked severity left-sided hydronephrosis and hydroureter with prominence of the intrarenal calices. The right kidney is normal in size, without renal calculi, focal lesion, or hydronephrosis. The urinary bladder is absent. Stomach/Bowel: There is a moderate-sized hiatal hernia. The appendix is not clearly identified. No evidence of bowel dilatation. Noninflamed diverticula are seen throughout the sigmoid colon. Vascular/Lymphatic: Marked severity aortic calcification. No enlarged abdominal or pelvic lymph nodes. Reproductive: The prostate gland is absent. Other: A right lower quadrant ostomy site is noted. No abdominopelvic ascites. Musculoskeletal: Moderate to marked severity multilevel degenerative changes seen throughout the lumbar spine. IMPRESSION: 1. Innumerable subcentimeter noncalcified lung nodules scattered throughout both lungs,  consistent with metastatic disease. 2. 5.2 cm x 3.0 cm patchy area of masslike consolidation within the posteromedial aspect of the left upper lobe, which may represent a primary neoplasm. 3. Small to moderate sized patchy areas of right upper lobe, posterior right middle lobe and superior right lower lobe atelectasis and/or infiltrate. 4. Moderate size left pleural effusion, and small right pleural effusion. 5. Cholelithiasis without evidence of cholecystitis. 6. Moderate-sized hiatal hernia. 7. Sigmoid diverticulosis. Aortic Atherosclerosis (ICD10-I70.0). Electronically Signed   By: Virgina Norfolk M.D.   On: 08/24/2019 21:27   CT HEAD WO CONTRAST  Result Date: 08/25/2019 CLINICAL DATA:  Follow-up examination for acute stroke. EXAM: CT HEAD WITHOUT CONTRAST TECHNIQUE: Contiguous axial images were obtained from the base of the skull through the vertex without intravenous contrast. COMPARISON:  Prior CT and MRI from 08/24/2019. FINDINGS: Brain: Moderately advanced cerebral atrophy with chronic small vessel ischemic disease. Few scatter remote lacunar infarcts noted at the right basal ganglia and cerebellum. No acute intracranial hemorrhage. No visible acute or evolving large vessel territory infarct. Small intraventricular mass within the right lateral ventricle again noted, stable. No hydrocephalus or ventricular trapping. No extra-axial fluid collection. Vascular: No hyperdense vessel. Calcified atherosclerosis present at skull base. Skull: Scalp soft tissues and calvarium within normal limits. Sinuses/Orbits: Globes and orbital soft tissues demonstrate no acute finding. Paranasal sinuses and mastoid air cells remain clear. Other: None. IMPRESSION: Stable head CT. No visible acute or evolving large vessel territory infarct. No other new intracranial abnormality. Electronically Signed   By: Jeannine Boga M.D.   On: 08/25/2019 06:30   CT CHEST WO CONTRAST  Result Date: 08/24/2019 CLINICAL DATA:   Left upper extremity weakness. EXAM: CT CHEST, ABDOMEN AND PELVIS WITHOUT CONTRAST TECHNIQUE: Multidetector CT imaging of the chest, abdomen and pelvis was performed following the standard protocol without IV contrast. COMPARISON:  None. FINDINGS: CT CHEST FINDINGS Cardiovascular: There is marked severity calcification of the aortic arch. Normal heart size. No pericardial effusion. Marked severity coronary artery calcification is seen. Mediastinum/Nodes: No enlarged mediastinal, hilar, or axillary lymph nodes. Thyroid gland, trachea, and esophagus demonstrate no significant findings. Lungs/Pleura: Innumerable subcentimeter noncalcified lung nodules are seen scattered throughout both lungs. A 5.2 cm x 3.0 cm patchy area of masslike consolidation is seen within the posteromedial aspect of the left upper lobe. Small to moderate sized patchy areas of atelectasis and/or infiltrate are seen within the right upper lobe posteromedial right middle lobe and superior aspect of the right lower lobe. Mild atelectasis is seen along the posterior aspect of the left lung base. There is a moderate size left pleural effusion.  A small right pleural effusion is also seen. No pneumothorax is identified. Musculoskeletal: Multiple sternal wires are seen. Degenerative changes seen within the thoracic spine. CT ABDOMEN PELVIS FINDINGS Hepatobiliary: No focal liver abnormality is seen. Multiple subcentimeter gallstones are seen within the lumen of an otherwise normal-appearing gallbladder. There is no evidence of gallbladder wall thickening or biliary dilatation. Pancreas: Unremarkable. No pancreatic ductal dilatation or surrounding inflammatory changes. Spleen: Normal in size without focal abnormality. Adrenals/Urinary Tract: Adrenal glands are unremarkable. The left kidney is atrophic in appearance. A 1.3 cm cyst is seen within the lateral aspect of the mid left kidney. There is marked severity left-sided hydronephrosis and hydroureter  with prominence of the intrarenal calices. The right kidney is normal in size, without renal calculi, focal lesion, or hydronephrosis. The urinary bladder is absent. Stomach/Bowel: There is a moderate-sized hiatal hernia. The appendix is not clearly identified. No evidence of bowel dilatation. Noninflamed diverticula are seen throughout the sigmoid colon. Vascular/Lymphatic: Marked severity aortic calcification. No enlarged abdominal or pelvic lymph nodes. Reproductive: The prostate gland is absent. Other: A right lower quadrant ostomy site is noted. No abdominopelvic ascites. Musculoskeletal: Moderate to marked severity multilevel degenerative changes seen throughout the lumbar spine. IMPRESSION: 1. Innumerable subcentimeter noncalcified lung nodules scattered throughout both lungs, consistent with metastatic disease. 2. 5.2 cm x 3.0 cm patchy area of masslike consolidation within the posteromedial aspect of the left upper lobe, which may represent a primary neoplasm. 3. Small to moderate sized patchy areas of right upper lobe, posterior right middle lobe and superior right lower lobe atelectasis and/or infiltrate. 4. Moderate size left pleural effusion, and small right pleural effusion. 5. Cholelithiasis without evidence of cholecystitis. 6. Moderate-sized hiatal hernia. 7. Sigmoid diverticulosis. Aortic Atherosclerosis (ICD10-I70.0). Electronically Signed   By: Virgina Norfolk M.D.   On: 08/24/2019 21:25   CT CERVICAL SPINE WO CONTRAST  Addendum Date: 08/24/2019   ADDENDUM REPORT: 08/24/2019 19:17 ADDENDUM: Findings of innumerable nodules within the imaged lung apices and partially imaged left pleural effusion, and recommendation for additional dedicated CT imaging of the chest, called by telephone at the time of interpretation on 08/24/2019 at 7:16 pm to provider Karmen Bongo , who verbally acknowledged these results. Electronically Signed   By: Kellie Simmering DO   On: 08/24/2019 19:17   Result Date:  08/24/2019 CLINICAL DATA:  Neuro deficit, acute, stroke suspected. Additional provided: Left upper extremity weakness. EXAM: CT CERVICAL SPINE WITHOUT CONTRAST TECHNIQUE: Multidetector CT imaging of the cervical spine was performed without intravenous contrast. Multiplanar CT image reconstructions were also generated. COMPARISON:  No pertinent prior studies available for comparison. FINDINGS: Alignment: Trace C2-C3 grade retrolisthesis. 2 mm C4-C5 grade 1 anterolisthesis. Trace C5-C6 retrolisthesis. Skull base and vertebrae: The basion-dental and atlanto-dental intervals are maintained.No evidence of acute fracture to the cervical spine. Soft tissues and spinal canal: No prevertebral fluid or swelling. Disc levels: Multilevel disc space narrowing. Most notably, there is moderate/advanced disc space narrowing at C5-C6. Degenerative fusion across the C6-C7 disc space. Pannus formation posterior to the dens without significant appreciable spinal canal narrowing. C2-C3: No significant disc herniation. Facet arthrosis. Degenerative fusion across the right facet joint. No appreciable significant spinal canal stenosis or neural foraminal narrowing. C3-C4: Small disc bulge. Uncovertebral hypertrophy (greater on the right). Facet arthrosis. No appreciable significant spinal canal stenosis. Mild right neural foraminal narrowing. C4-C5: Grade 1 anterolisthesis. Disc uncovering. Uncinate hypertrophy. Facet hypertrophy which is prominent on the right. Degenerative fusion of the facet joint on the right. No  appreciable significant spinal canal stenosis. Mild/moderate right neural foraminal narrowing. C5-C6: Shallow posterior disc osteophyte complex. Uncovertebral hypertrophy. Prominent facet hypertrophy. No appreciable significant spinal canal stenosis or neural foraminal narrowing. C6-C7: Degenerative fusion across the disc space. Uncovertebral hypertrophy. Facet hypertrophy. Degenerative fusion across the facet joint on the  right. No appreciable significant spinal canal stenosis. Mild left neural foraminal narrowing. C7-T1: Uncovertebral hypertrophy. Mild facet arthrosis. No appreciable significant spinal canal stenosis or neural foraminal narrowing. Upper chest: There are innumerable nodules within the imaged lung apices. Partially visualized left pleural effusion. Other: Carotid artery atherosclerotic calcification. IMPRESSION: Cervical spondylosis as outlined multilevel degenerative fusion. No appreciable significant spinal canal stenosis at any level. Mild/moderate C4-C5 right neural foraminal narrowing. Additional sites of mild neural foraminal narrowing as described. Multilevel grade 1 spondylolisthesis. There are innumerable nodules within the imaged lung apices. Partially imaged left pleural effusion. Dedicated contrast-enhanced chest CT is recommended for further evaluation. Electronically Signed: By: Kellie Simmering DO On: 08/24/2019 12:59   MR BRAIN WO CONTRAST  Result Date: 08/24/2019 CLINICAL DATA:  Focal neuro deficit, greater than 6 hours, stroke suspected. Additional history provided: Left-sided arm weakness, chest tightness, known atrial fibrillation. EXAM: MRI HEAD WITHOUT CONTRAST TECHNIQUE: Multiplanar, multiecho pulse sequences of the brain and surrounding structures were obtained without intravenous contrast. COMPARISON:  Noncontrast head CT 08/24/2019 FINDINGS: Brain: Prominent susceptibility artifact arising from the face region obscures the portions of the right orbit, face and intracranial contents on multiple sequences. This limits evaluation. The susceptibility artifact appears to arise from a retained metallic foreign body within the forehead soft tissues as demonstrated on head CT performed earlier the same day. Due to marked susceptibility artifact the diffusion-weighted imaging is nondiagnostic and this precludes adequate evaluation for acute infarct. Redemonstrated 12 mm pedunculated mass within the  body of the right lateral ventricle, arising from the lateral wall (series 7, image 16) (series 8, image 16). Mild patchy T2/FLAIR hyperintensity within the cerebral white matter is nonspecific, but consistent with chronic small vessel ischemic disease. Chronic lacunar infarcts within the right caudate head and within the cerebellum. Moderate generalized parenchymal atrophy. No chronic intracranial blood products are identified within described limitations. No extra-axial fluid collection is identified. No midline shift. Vascular: Expected proximal arterial flow voids. Skull and upper cervical spine: No focal marrow lesion is identified within described limitations. Sinuses/Orbits: No acute orbital abnormality identified. No significant paranasal sinus disease within described limitations. No significant mastoid effusion. IMPRESSION: Marked susceptibility artifact arising from a retained metallic foreign body within the face obscures the intracranial contents on multiple sequences and significantly limits evaluation. The susceptibility artifact renders the diffusion-weighted imaging non-diagnostic and this precludes adequate evaluation for acute infarct. Redemonstrated 12 mm pedunculated mass within the body of the right lateral ventricle. The mass is incompletely characterized on this non-contrast study. Nonemergent contrast-enhanced brain MRI is recommended for further evaluation. Chronic lacunar infarcts within the right basal ganglia and within the cerebellum. Mild chronic small vessel ischemic disease within the cerebral white matter. Moderate generalized parenchymal atrophy. Electronically Signed   By: Kellie Simmering DO   On: 08/24/2019 08:56   ECHOCARDIOGRAM COMPLETE  Result Date: 08/24/2019    ECHOCARDIOGRAM REPORT   Patient Name:   [name]. Date of Exam: 08/24/2019 Medical Rec #:  [id number]       Height:       71.0 in Accession #:    [id number]      Weight:       144.0 lb  Date of Birth:  Apr 01, 1932         BSA:          1.834 m Patient Age:    20 years        BP:           125/93 mmHg Patient Gender: M               HR:           85 bpm. Exam Location:  Inpatient Procedure: 2D Echo, Cardiac Doppler and Color Doppler Indications:    CVA  History:        Patient has prior history of Echocardiogram examinations, most                 recent 10/27/2013. CAD, Prior CABG, Carotid Disease and PAD,                 Aortic Valve Disease, Arrythmias:Atrial Fibrillation; Risk                 Factors:Hypertension and Dyslipidemia. Pleural effusion.  Sonographer:    Dustin Flock Referring Phys: [place]  Sonographer Comments: Technically difficult study due to poor echo windows. Image acquisition challenging due to respiratory motion and patient very thin. IMPRESSIONS  1. Left ventricular ejection fraction, by estimation, is 60 to 65%. The left ventricle has normal function. Left ventricular endocardial border not optimally defined to evaluate regional wall motion. There is moderate left ventricular hypertrophy. Left ventricular diastolic parameters are indeterminate.  2. Right ventricular systolic function is mildly reduced. The right ventricular size is normal. There is normal pulmonary artery systolic pressure. The estimated right ventricular systolic pressure is 92.4 mmHg.  3. The mitral valve is normal in structure. Mild mitral valve regurgitation. No evidence of mitral stenosis.  4. Tricuspid valve regurgitation is moderate.  5. The aortic valve is severely calcified with severely reduced cusp mobility. Aortic valve regurgitation is not visualized. Mild to moderate aortic valve stenosis. Aortic valve mean gradient measures 17.0 mmHg. This is likely an underestimate of true severity due to suboptimal Doppler alignment and image quality.  6. The inferior vena cava is normal in size with greater than 50% respiratory variability, suggesting right atrial pressure of 3 mmHg. Conclusion(s)/Recommendation(s): No  intracardiac source of embolism detected on this transthoracic study. A transesophageal echocardiogram is recommended to exclude cardiac source of embolism if clinically indicated. FINDINGS  Left Ventricle: Left ventricular ejection fraction, by estimation, is 60 to 65%. The left ventricle has normal function. Left ventricular endocardial border not optimally defined to evaluate regional wall motion. The left ventricular internal cavity size was normal in size. There is moderate left ventricular hypertrophy. Left ventricular diastolic parameters are indeterminate. Right Ventricle: The right ventricular size is normal. No increase in right ventricular wall thickness. Right ventricular systolic function is mildly reduced. There is normal pulmonary artery systolic pressure. The tricuspid regurgitant velocity is 2.51 m/s, and with an assumed right atrial pressure of 3 mmHg, the estimated right ventricular systolic pressure is 26.8 mmHg. Left Atrium: Left atrial size was normal in size. Right Atrium: Right atrial size was normal in size. Pericardium: There is no evidence of pericardial effusion. Mitral Valve: The mitral valve is normal in structure. Normal mobility of the mitral valve leaflets. Mild mitral valve regurgitation. No evidence of mitral valve stenosis. Tricuspid Valve: The tricuspid valve is normal in structure. Tricuspid valve regurgitation is moderate. Aortic Valve: The aortic valve is abnormal. Aortic valve regurgitation is  not visualized. Mild to moderate aortic stenosis is present. There is severe calcifcation of the aortic valve. Aortic valve mean gradient measures 17.0 mmHg. Aortic valve peak gradient measures 38.2 mmHg. Aortic valve area, by VTI measures 1.01 cm. Pulmonic Valve: The pulmonic valve was not well visualized. Pulmonic valve regurgitation is not visualized. Aorta: The aortic root is normal in size and structure. Venous: The inferior vena cava is normal in size with greater than 50%  respiratory variability, suggesting right atrial pressure of 3 mmHg. IAS/Shunts: The interatrial septum was not well visualized.  LEFT VENTRICLE PLAX 2D LVIDd:         2.68 cm  Diastology LVIDs:         1.97 cm  LV e' lateral:   6.20 cm/s LV PW:         1.49 cm  LV E/e' lateral: 10.0 LV IVS:        1.42 cm  LV e' medial:    7.18 cm/s LVOT diam:     2.30 cm  LV E/e' medial:  8.6 LV SV:         54 LV SV Index:   29 LVOT Area:     4.15 cm  RIGHT VENTRICLE RV Basal diam:  2.83 cm RV S prime:     8.05 cm/s TAPSE (M-mode): 1.5 cm LEFT ATRIUM             Index       RIGHT ATRIUM           Index LA diam:        4.00 cm 2.18 cm/m  RA Area:     16.80 cm LA Vol (A2C):   59.7 ml 32.56 ml/m RA Volume:   44.60 ml  24.32 ml/m LA Vol (A4C):   30.8 ml 16.80 ml/m LA Biplane Vol: 44.1 ml 24.05 ml/m  AORTIC VALVE AV Area (Vmax):    0.97 cm AV Area (Vmean):   1.09 cm AV Area (VTI):     1.01 cm AV Vmax:           309.00 cm/s AV Vmean:          179.000 cm/s AV VTI:            0.534 m AV Peak Grad:      38.2 mmHg AV Mean Grad:      17.0 mmHg LVOT Vmax:         72.40 cm/s LVOT Vmean:        46.800 cm/s LVOT VTI:          0.130 m LVOT/AV VTI ratio: 0.24  AORTA Ao Root diam: 3.20 cm MITRAL VALVE               TRICUSPID VALVE MV Area (PHT): 3.17 cm    TR Peak grad:   25.2 mmHg MV Decel Time: 239 msec    TR Vmax:        251.00 cm/s MV E velocity: 62.10 cm/s                            SHUNTS                            Systemic VTI:  0.13 m  Systemic Diam: 2.30 cm Cherlynn Kaiser MD Electronically signed by Cherlynn Kaiser MD Signature Date/Time: 08/24/2019/10:41:57 PM    Final    CT HEAD CODE STROKE WO CONTRAST  Result Date: 08/24/2019 CLINICAL DATA:  Code stroke.  Left arm weakness EXAM: CT HEAD WITHOUT CONTRAST TECHNIQUE: Contiguous axial images were obtained from the base of the skull through the vertex without intravenous contrast. COMPARISON:  None. FINDINGS: Brain: No evidence of acute infarction,  hemorrhage, or hydrocephalus. Remote lacunar infarct at the right caudate head. Prominent extra-axial CSF spaces but no definite cortical mass effect for hygroma. There is an isodense mass the level of the body right lateral ventricle measuring 11 mm. Small likely remote right cerebellar infarct. Vascular: No hyperdense vessel or unexpected calcification. Skull: Normal. Negative for fracture or focal lesion. Sinuses/Orbits: No acute finding. Other: These results were communicated to Dr. Cheral Marker at [place] 5/21/2021by text page via the [facility] messaging system. ASPECTS [facility] Stroke Program Early CT Score) - Ganglionic level infarction (caudate, lentiform nuclei, internal capsule, insula, M1-M3 cortex): 7 - Supraganglionic infarction (M4-M6 cortex): 3 Total score (0-10 with 10 being normal): 10 IMPRESSION: 1. No acute finding.  ASPECTS is 10. 2. 11 mm mass at the right lateral ventricle. 3. Remote appearing infarcts at the right caudate and cerebellum. Electronically Signed   By: Monte Fantasia M.D.   On: 08/24/2019 04:34   VAS US CAROTID  Result Date: 08/24/2019 Carotid Arterial Duplex Study Indications:  CVA. Risk Factors: Hypertension, hyperlipidemia. Performing Technologist: Maudry Mayhew MHA, RDMS, RVT, RDCS  Examination Guidelines: A complete evaluation includes B-mode imaging, spectral Doppler, color Doppler, and power Doppler as needed of all accessible portions of each vessel. Bilateral testing is considered an integral part of a complete examination. Limited examinations for reoccurring indications may be performed as noted.  Right Carotid Findings: +----------+-------+-------+--------+---------------------------------+--------+             PSV     EDV     Stenosis Plaque Description                Comments              cm/s    cm/s                                                         +----------+-------+-------+--------+---------------------------------+--------+  CCA Prox   69      17                smooth and homogeneous                      +----------+-------+-------+--------+---------------------------------+--------+  CCA Distal 69      7                smooth, heterogenous and calcific           +----------+-------+-------+--------+---------------------------------+--------+  ICA Prox   177     30               heterogenous, irregular and  calcific                                    +----------+-------+-------+--------+---------------------------------+--------+  ICA Distal 74      26                                                           +----------+-------+-------+--------+---------------------------------+--------+  ECA        220     21               heterogenous, irregular and                                                      calcific                                    +----------+-------+-------+--------+---------------------------------+--------+ +----------+--------+-------+----------------+-------------------+             PSV cm/s EDV cms Describe         Arm Pressure (mmHG)  +----------+--------+-------+----------------+-------------------+  Subclavian 71               Multiphasic, WNL                      +----------+--------+-------+----------------+-------------------+ +---------+--------+--+--------+-+---------+  Vertebral PSV cm/s 25 EDV cm/s 8 Antegrade  +---------+--------+--+--------+-+---------+  Left Carotid Findings: +----------+-------+-------+--------+---------------------------------+--------+             PSV     EDV     Stenosis Plaque Description                Comments              cm/s    cm/s                                                         +----------+-------+-------+--------+---------------------------------+--------+  CCA Prox   72      11               smooth and heterogenous                     +----------+-------+-------+--------+---------------------------------+--------+  CCA Distal 52      12                calcific                                    +----------+-------+-------+--------+---------------------------------+--------+  ICA Prox   62      15               heterogenous, irregular and  calcific                                    +----------+-------+-------+--------+---------------------------------+--------+  ICA Distal 88      13                                                           +----------+-------+-------+--------+---------------------------------+--------+  ECA        119     14               heterogenous and calcific                   +----------+-------+-------+--------+---------------------------------+--------+ +----------+--------+--------+----------------+-------------------+             PSV cm/s EDV cm/s Describe         Arm Pressure (mmHG)  +----------+--------+--------+----------------+-------------------+  Subclavian 88                Multiphasic, WNL                      +----------+--------+--------+----------------+-------------------+ +---------+--------+--+--------+-+---------+  Vertebral PSV cm/s 34 EDV cm/s 7 Antegrade  +---------+--------+--+--------+-+---------+   Summary: Right Carotid: Velocities in the right ICA are consistent with a 1-39% stenosis. Left Carotid: Velocities in the left ICA are consistent with a 1-39% stenosis. Vertebrals:  Bilateral vertebral arteries demonstrate antegrade flow. Subclavians: Normal flow hemodynamics were seen in bilateral subclavian              arteries. *See table(s) above for measurements and observations.     Preliminary    VAS [place] TRANSCRANIAL DOPPLER  Result Date: 08/24/2019  Transcranial Doppler Indications: Stroke. Limitations: Poor acoustic windows Limitations for diagnostic windows: Unable to insonate right transtemporal window. Performing Technologist: Maudry Mayhew MHA, RDMS, RVT, RDCS  Examination Guidelines: A complete evaluation includes B-mode imaging,  spectral Doppler, color Doppler, and power Doppler as needed of all accessible portions of each vessel. Bilateral testing is considered an integral part of a complete examination. Limited examinations for reoccurring indications may be performed as noted.  +----------+-------------+----------+-----------+------------------+  RIGHT TCD  Right VM (cm) Depth (cm) Pulsatility      Comment        +----------+-------------+----------+-----------+------------------+  MCA                                             Unable to insonate  +----------+-------------+----------+-----------+------------------+  ACA                                             Unable to insonate  +----------+-------------+----------+-----------+------------------+  Term ICA                                        Unable to insonate  +----------+-------------+----------+-----------+------------------+  PCA  Unable to insonate  +----------+-------------+----------+-----------+------------------+  Opthalmic      15.00                   1.47                         +----------+-------------+----------+-----------+------------------+  ICA siphon     16.00                   1.18                         +----------+-------------+----------+-----------+------------------+  Vertebral     -22.00                   1.10                         +----------+-------------+----------+-----------+------------------+  +----------+------------+----------+-----------+------------------+  LEFT TCD   Left VM (cm) Depth (cm) Pulsatility      Comment        +----------+------------+----------+-----------+------------------+  MCA           18.00                   1.48                         +----------+------------+----------+-----------+------------------+  ACA                                            Unable to insonate  +----------+------------+----------+-----------+------------------+  Term ICA                                        Unable to insonate  +----------+------------+----------+-----------+------------------+  PCA                                            Unable to insonate  +----------+------------+----------+-----------+------------------+  Opthalmic                                      Unable to insonate  +----------+------------+----------+-----------+------------------+  ICA siphon    14.00                   1.22                         +----------+------------+----------+-----------+------------------+  Vertebral     -16.00                  0.89                         +----------+------------+----------+-----------+------------------+  +------------+-------+------------------+               VM cm/s      Comment        +------------+-------+------------------+  Prox Basilar         Unable to insonate  +------------+-------+------------------+    Preliminary     ASSESSMENT & PLAN [name]. 86  y.o. male with medical history significant for CAD, GERD, HTN, and bladder cancer s/p urostomy who presents with left upper extremity weakness and was found to have a brain lesion and lung mass concerning for metastatic disease.  After review of the imaging, review the records, and review the labs the patient's findings are most consistent with metastatic cancer of lung origin.  Given the patient's history of bladder cancer that is certainly a possibility as well, though given the pattern of spread the findings are most consistent with lung cancer.  In order to further evaluate I would recommend a biopsy of this lesion either performed by the pulmonary service via endobronchial biopsy or IR guided biopsy.  At this time it does not appear that his symptoms are related to the lesion noted in his brain as it is within the ventricle and not in the area typically associated with left-sided weakness.  We can have these images and the patient's case evaluated by our neuro oncologist in order to confirm this conclusion.  Once  tissue biopsy is obtained we can consider further treatment potentially including systemic chemotherapy with radiation to the lesion in the brain.  Of note given this patient's advanced age of 86 years old would be appropriate to consider comfort measures only and referral to hospice of the patient so chose.  Today we will breech the topic of goals of care discussion, but I would recommend palliative care service be involved with this patient from the start.   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 If not, can consider IR guided biopsy --our service will discuss this case with neuro-oncologyfor evaluation of his brain lesion. It does not appear to be the cause of his current symptoms, therefore I do not think steroid therapy is necessary at this time.  --after goals of care discussion with the patient, he notes that he may not want treatment, but would be interesting in knowing what type of cancer he has and what the prognosis would be.  --recommend consult to palliative care for assistance discussions regarding goals of care/hospice  --Oncology will continue to follow.   All questions were answered. The patient knows to call the clinic with any problems, questions or concerns.  A total of more than 55 minutes were spent on this encounter and over half of that time was spent on counseling and coordination of care as outlined above.   Ledell Peoples, MD Department of Hematology/Oncology [place] [facility] Phone: [phone] Pager: [phone] Email: [email or web address]  08/25/2019 3:28 PM

## 2019-08-25 NOTE — Progress Notes (Signed)
PROGRESS NOTE  [name]  DOB: 09/14/1931  PCP: Tonia Ghent, MD IWL:[id number]  DOA: 08/24/2019  LOS: 1 day   Chief Complaint  Patient presents with  . Code Stroke   Brief narrative: Markis Langland. is an 86 y.o. male with a PMHx of atrial fibrillation, bladder cancer s/p resection with urostomy, CAD s/p CABG in 1983, bilateral carotid artery stenosis, CKD stage III, HTN, HLD and PVD. Patient presented to the ED on 5/21 from home with new onset of LUE weakness that he noticed on awakening at 0300, after going to bed at 1900 while still at his baseline.  EMS noted some LUE drift and left hand weakness.  Of note, he is on Eliquis for his atrial fibrillation. He has had chronic dysphagia for a couple of weeks, had an EGD on 5/11; he had esophageal dilation with some improvement.  In the ED, he was hemodynamically stable.  Admitted for stroke work-up. Neurology consult obtained.  CT scan of head did not show any acute infarct but showed remote appearing infarct in the right caudate and cerebellum.  He was also found to have 11 mm mass at the right lateral ventricle. MRI confirmed a 12 mm pedunculated mass within the body of the right lateral ventricle. CT scan of chest, abdomen and pelvis with contrast was obtained. It showed 1. innumerable subcentimeter noncalcified lung nodules scattered throughout both lungs, consistent with metastatic disease. 2. 5.2 cm x 3.0 cm patchy area of masslike consolidation within the posteromedial aspect of the left upper lobe, which may represent a primary neoplasm. 3. Small to moderate sized patchy areas of right upper lobe, posterior right middle lobe and superior right lower lobe atelectasis and/or infiltrate. 4. Moderate size left pleural effusion, and small right pleural effusion.  Subjective: Patient was seen and examined this morning.  Very pleasant elderly gentleman.  Lying on bed.  Not in distress.  Continues to have some weakness on the  left hand despite negative imaging results. I discussed with him about possibility of new diagnosis of lung cancer with mets.  Chart reviewed. No fever, heart rate mostly in 80s, blood pressure normal range, breathing comfortably room air. Creatinine 1.58 which is better than baseline Lipid panel with HDL 45, LDL 47.  A1c 6.1.   TSH low at 0.031.  Pending free T4 WBC count slightly elevated to 12.8 Echocardiogram with EF 60 to 65%, indeterminate diastolic function. Pending report of carotid Doppler and transcranial Doppler  Assessment/Plan: Acute stroke -Presented with acute onset L hand weakness.  Continues to have weakness on handgrip. -CT scan head and MRI brain findings as above did not show any acute infarct but showed remote appearing infarct in the right carotid and cerebellum. -Neurology consult appreciated.  He probably has a small right MCA moderate restrictive infarct despite being on Eliquis. -Lipid panel with HDL 45, LDL 47.  A1c 6.1.   -TSH low at 0.031.  Pending free T4 -Echocardiogram with EF 60 to 65%, indeterminate diastolic function. -Pending report of carotid Doppler and transcranial Doppler -PT/OT/ST eval. -Continue home medicines that include Eliquis, aspirin, statin.  Cardiovascular issues : HTN, HLD, CAD sp CABG in 1983, PAD, A. fib -Home meds include Toprol 25 mg daily along with Eliquis, aspirin and Crestor.   -Resume all. -Continue to monitor in telemetry.  Continue to monitor blood pressure  Suspect lung cancer with brain mets -Imaging findings as above showing left upper lobe mass with moderate-sized left pleural effusion, bilateral  pulmonary nodules as well as a 12 mm pedunculated mass in the right lateral ventricular brain. -Discussed with patient.  Unsurprisingly he is not clear at this time on was direction he wants to take.  I paged oncology to discuss the next best step.    CKD stage IV -Appears to have progressed to stage 4 CKD and is stable  at this time  Bladder cancer -Ileostomy in place, otherwise no current issues  Severe protein calorie malnutrition -Nutrition consult obtained.  Dietary supplements ordered.  DVT prophylaxis:Eliquis  Code Status:DNR - confirmed with patient Family Communication:None present; I left a message for his son on voicemail  Body mass index is 20.08 kg/m. Mobility: PT OT eval Code Status:  DNR per chart  DVT prophylaxis:  Eliquis Antimicrobials:  None Fluid: Normal saline at 50 mils per hour Diet: Cardiac diet  Consultants: Neurology Family Communication:  None at bedside  Status is: Inpatient  Remains inpatient appropriate because:Hemodynamically unstable, Ongoing diagnostic testing needed not appropriate for outpatient work up and Inpatient level of care appropriate due to severity of illness   Dispo: The patient is from: Home              Anticipated d/c is to: Home              Anticipated d/c date is: 2 days              Patient currently is not medically stable to d/c.  Antimicrobials: Anti-infectives (From admission, onward)   None        Code Status: DNR   Diet Order            Diet Heart Room service appropriate? Yes; Fluid consistency: Thin  Diet effective ____              Infusions:  . sodium chloride 50 mL/hr at 08/25/19 0500    Scheduled Meds: .  stroke: mapping our early stages of recovery book   Does not apply Once  . allopurinol  100 mg Oral QODAY  . apixaban  2.5 mg Oral BID  . aspirin EC  81 mg Oral Daily  . famotidine  40 mg Oral BID  . feeding supplement (ENSURE ENLIVE)  237 mL Oral TID BM  . multivitamin with minerals  1 tablet Oral Daily  . rosuvastatin  5 mg Oral Daily    PRN meds: acetaminophen **OR** acetaminophen (TYLENOL) oral liquid 160 mg/5 mL **OR** acetaminophen, polyethylene glycol   Objective: Vitals:   08/25/19 0340 08/25/19 0814  BP: 121/81 116/85  Pulse: 83 75  Resp: 20 20  Temp: (!) 97.4 F (36.3 C) 97.7  F (36.5 C)  SpO2: 94% 94%    Intake/Output Summary (Last 24 hours) at 08/25/2019 0843 Last data filed at 08/25/2019 0700 Gross per 24 hour  Intake 1102.54 ml  Output 1050 ml  Net 52.54 ml   Filed Weights   08/24/19 0436  Weight: 65.3 kg   Weight change:  Body mass index is 20.08 kg/m.   Physical Exam: General exam: Appears calm and comfortable.  Skin: No rashes, lesions or ulcers. HEENT: Atraumatic, normocephalic, supple neck, no obvious bleeding Lungs: Diminished breath sounds on left base.  Otherwise clear to auscultation CVS: Regular rate and rhythm, no murmur GI/Abd soft, nontender, nondistended, bowel sound present CNS: Alert, awake, oriented x3 Psychiatry: Mood appropriate Extremities: No pedal edema, no calf tenderness  Data Review: I have personally reviewed the laboratory data and studies available.  Recent Labs  Lab 08/24/19 0422 08/24/19 0431 08/25/19 0307  WBC 12.7*  --  12.8*  NEUTROABS 9.0*  --   --   HGB 14.5 15.6 14.0  HCT 44.9 46.0 43.0  MCV 91.3  --  89.6  PLT 381  --  344   Recent Labs  Lab 08/24/19 0422 08/24/19 0431 08/25/19 0307  NA 137 136 134*  K 4.3 4.4 4.2  CL 100 99 101  CO2 26  --  22  GLUCOSE 98 89 102*  BUN 37* 46* 34*  CREATININE 2.08* 1.80* 1.58*  CALCIUM 9.7  --  9.5  PHOS  --   --  2.9    Signed, Terrilee Croak, MD [facility] Pager: [phone] (Secure Chat preferred). 08/25/2019
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
## 2019-08-25 NOTE — Plan of Care (Signed)
  Problem: Education: Goal: Knowledge of General Education information will improve Description: Including pain rating scale, medication(s)/side effects and non-pharmacologic comfort measures Outcome: Progressing   Problem: Nutrition: Goal: Adequate nutrition will be maintained Outcome: Progressing   Problem: Safety: Goal: Ability to remain free from injury will improve Outcome: Progressing   

## 2019-08-25 NOTE — Progress Notes (Signed)
PT Cancellation Note  Patient Details Name: [name]. MRN: [id number] DOB: May 18, 1931   Cancelled Treatment:    Reason Eval/Treat Not Completed: Patient declined, no reason specified. Patient refused x 2 this am, states he got bad news this am. Will re-attempt to evaluated tomorrow per his request.      Lulla [place] 08/25/2019, 11:40 AM

## 2019-08-25 NOTE — Progress Notes (Signed)
STROKE TEAM PROGRESS NOTE   INTERVAL HISTORY Pt lying in bed, feeling sad as he was told bad news that he could have lung cancer. His CT chest showed numerous lung nodules with LUL mass like consolidations. Oncology consulted. Given likelihood needing biopsy, recommend change eliquis to heparin IV for preparation.    Vitals:   08/24/19 2322 08/25/19 0340 08/25/19 0814 08/25/19 1214  BP: 125/74 121/81 116/85 (!) 149/107  Pulse: (!) 106 83 75 67  Resp: (!) 22 20 20  (!) 21  Temp: 98.5 F (36.9 C) (!) 97.4 F (36.3 C) 97.7 F (36.5 C) 97.6 F (36.4 C)  TempSrc: Oral Oral Oral Oral  SpO2: 93% 94% 94% 94%  Weight:      Height:        CBC:  Recent Labs  Lab 08/24/19 0422 08/24/19 0422 08/24/19 0431 08/25/19 0307  WBC 12.7*  --   --  12.8*  NEUTROABS 9.0*  --   --   --   HGB 14.5   < > 15.6 14.0  HCT 44.9   < > 46.0 43.0  MCV 91.3  --   --  89.6  PLT 381  --   --  344   < > = values in this interval not displayed.    Basic Metabolic Panel:  Recent Labs  Lab 08/24/19 0422 08/24/19 0422 08/24/19 0431 08/25/19 0307  NA 137   < > 136 134*  K 4.3   < > 4.4 4.2  CL 100   < > 99 101  CO2 26  --   --  22  GLUCOSE 98   < > 89 102*  BUN 37*   < > 46* 34*  CREATININE 2.08*   < > 1.80* 1.58*  CALCIUM 9.7  --   --  9.5  PHOS  --   --   --  2.9   < > = values in this interval not displayed.   Lipid Panel:     Component Value Date/Time   CHOL 114 08/24/2019 1000   CHOL 206 (H) 07/11/2013 0829   TRIG 110 08/24/2019 1000   HDL 45 08/24/2019 1000   HDL 68 07/11/2013 0829   CHOLHDL 2.5 08/24/2019 1000   VLDL 22 08/24/2019 1000   LDLCALC 47 08/24/2019 1000   LDLCALC 88 07/11/2013 0829   HgbA1c:  Lab Results  Component Value Date   HGBA1C 6.1 (H) 08/24/2019   Urine Drug Screen:     Component Value Date/Time   LABOPIA NONE DETECTED 08/24/2019 1000   COCAINSCRNUR NONE DETECTED 08/24/2019 1000   LABBENZ NONE DETECTED 08/24/2019 1000   AMPHETMU NONE DETECTED 08/24/2019  1000   THCU NONE DETECTED 08/24/2019 1000   LABBARB NONE DETECTED 08/24/2019 1000    Alcohol Level     Component Value Date/Time   ETH <10 08/24/2019 0422    IMAGING past 24 hours  CT CHEST WO CONTRAST 08/24/2019 IMPRESSION:  1. Innumerable subcentimeter noncalcified lung nodules scattered throughout both lungs, consistent with metastatic disease.  2. 5.2 cm x 3.0 cm patchy area of masslike consolidation within the posteromedial aspect of the left upper lobe, which may represent a primary neoplasm.  3. Small to moderate sized patchy areas of right upper lobe, posterior right middle lobe and superior right lower lobe atelectasis and/or infiltrate.  4. Moderate size left pleural effusion, and small right pleural effusion.  5. Cholelithiasis without evidence of cholecystitis.  6. Moderate-sized hiatal hernia.  7. Sigmoid diverticulosis.  Aortic Atherosclerosis (ICD10-I70.0).   CT ABDOMEN PELVIS WO CONTRAST 08/24/2019 IMPRESSION:  1. Innumerable subcentimeter noncalcified lung nodules scattered throughout both lungs, consistent with metastatic disease.  2. 5.2 cm x 3.0 cm patchy area of masslike consolidation within the posteromedial aspect of the left upper lobe, which may represent a primary neoplasm.  3. Small to moderate sized patchy areas of right upper lobe, posterior right middle lobe and superior right lower lobe atelectasis and/or infiltrate.  4. Moderate size left pleural effusion, and small right pleural effusion.  5. Cholelithiasis without evidence of cholecystitis.  6. Moderate-sized hiatal hernia.  7. Sigmoid diverticulosis.   Aortic Atherosclerosis (ICD10-I70.0).   CT HEAD WO CONTRAST 08/25/2019 IMPRESSION:  Stable head CT. No visible acute or evolving large vessel territory infarct. No other new intracranial abnormality.   Transthoracic Echocardiogram - pending  Bilateral Carotid Dopplers  Summary:  Right Carotid: Velocities in the right ICA are consistent  with a 1-39% stenosis.  Left Carotid: Velocities in the left ICA are consistent with a 1-39% stenosis.  Vertebrals: Bilateral vertebral arteries demonstrate antegrade flow.  Subclavians: Normal flow hemodynamics were seen in bilateral subclavian arteries.    Transcranial Doppler  Indications: Stroke.  Limitations: Poor acoustic windows Limitations for diagnostic windows: Unable to insonate right transtemporal  window.  Performing Technologist: Maudry Mayhew MHA, RDMS, RVT, RDCS  Examination Guidelines: A complete evaluation includes B-mode imaging,  spectral  Doppler, color Doppler, and power Doppler as needed of all accessible  portions  of each vessel. Bilateral testing is considered an integral part of a  complete  examination. Limited examinations for reoccurring indications may be  performed  as noted.   +----------+-------------+----------+-----------+------------------+  RIGHT TCD Right VM (cm)Depth (cm)Pulsatility   Comment     +----------+-------------+----------+-----------+------------------+  MCA                      Unable to insonate  +----------+-------------+----------+-----------+------------------+  ACA                      Unable to insonate  +----------+-------------+----------+-----------+------------------+  Term ICA                   Unable to insonate  +----------+-------------+----------+-----------+------------------+  PCA                      Unable to insonate  +----------+-------------+----------+-----------+------------------+  Opthalmic   15.00         1.47             +----------+-------------+----------+-----------+------------------+  ICA siphon  16.00         1.18             +----------+-------------+----------+-----------+------------------+  Vertebral   -22.00          1.10             +----------+-------------+----------+-----------+------------------+     +----------+------------+----------+-----------+------------------+  LEFT TCD Left VM (cm)Depth (cm)Pulsatility   Comment     +----------+------------+----------+-----------+------------------+  MCA      18.00         1.48             +----------+------------+----------+-----------+------------------+  ACA                     Unable to insonate  +----------+------------+----------+-----------+------------------+  Term ICA                  Unable to insonate  +----------+------------+----------+-----------+------------------+  PCA  Unable to insonate  +----------+------------+----------+-----------+------------------+  Opthalmic                  Unable to insonate  +----------+------------+----------+-----------+------------------+  ICA siphon  14.00         1.22             +----------+------------+----------+-----------+------------------+  Vertebral   -16.00         0.89             +----------+------------+----------+-----------+------------------+   +------------+-------+------------------+        VM cm/s   Comment     +------------+-------+------------------+  Prox Basilar    Unable to insonate  +------------+-------+------------------+      PHYSICAL EXAM  Temp:  [97.4 F (36.3 C)-98.5 F (36.9 C)] 97.6 F (36.4 C) (05/22 1214) Pulse Rate:  [47-106] 67 (05/22 1214) Resp:  [18-31] 21 (05/22 1214) BP: (116-159)/(74-107) 149/107 (05/22 1214) SpO2:  [91 %-96 %] 94 % (05/22 1214)  General - Well nourished, well developed, in no apparent distress.  Ophthalmologic - fundi not visualized due to noncooperation.  Cardiovascular - irregularly  irregular heart rate and rhythm.  Mental Status -  Level of arousal and orientation to time, place, and person were intact. Language including expression, naming, repetition, comprehension was assessed and found intact. Fund of Knowledge was assessed and was intact.  Cranial Nerves II - XII - II - Visual field intact OU. III, IV, VI - Extraocular movements intact. V - Facial sensation intact bilaterally. VII - Facial movement intact bilaterally. VIII - Hearing & vestibular intact bilaterally. X - Palate elevates symmetrically. XI - Chin turning & shoulder shrug intact bilaterally. XII - Tongue protrusion intact.  Motor Strength - The patient's strength was symmetrical in all extremities except pronator drift on the left and left wrist extension 4+/5 and finger grip 3+/5 with significant dexterity diffeiculty.  Bulk was normal and fasciculations were absent.   Motor Tone - Muscle tone was assessed at the neck and appendages and was normal.  Reflexes - The patient's reflexes were symmetrical in all extremities and he had no pathological reflexes.  Sensory - Light touch, temperature/pinprick were assessed and were symmetrical.    Coordination - The patient had normal movements in the right hands with no ataxia or dysmetria, but left FTN mild dysmetria and slow action but still proportional to his weakness.  Tremor was absent.  Gait and Station - deferred.   ASSESSMENT/PLAN [name]. is a 86 y.o. male with history of atrial fibrillation on eliquis, bladder cancer s/p resection with urostomy, CAD s/p CABG in 1983, bilateral carotid artery stenosis, CKD stage III, HTN, HLD and PVD presenting with LUE weakness.   Stroke - possible small right MCA motor strip infarct - likely due to hypercoagulable state from advanced malignancy vs. afib even on eliquis. Do not think this is brain metastasis given rapid improvement.   Code Stroke CT head No acute abnormality. Old R caudate and  cerebellar infarcts. ASPECTS 10.     MRI  nondiagnostic DWI due to artifact with metallic objects.  Old R basal ganglia and cerebellar lacunes. Small vessel disease.   Repeat CT 08/25/19 - Stable head CT. No visible acute or evolving large vessel territory infarct. No other new intracranial abnormality.   TCD - lack of window  Carotid Doppler - unremarkable  2D Echo EF 60-65%  LDL 47  HgbA1c 6.1  Eliquis for VTE prophylaxis  aspirin 81 mg daily and Eliquis (apixaban) daily prior  to admission, now on aspirin 81 mg daily and Eliquis (apixaban) daily. Given likelihood of biopsy, will recommend change eliquis to heparin IV  Therapy recommendations:  pending   Disposition:  pending   Possible lung cancer with mets Hx of bladder cancer  Chest CT - Innumerable subcentimeter noncalcified lung nodules scattered throughout both lungs, consistent with metastatic disease. 5.2 cm x 3.0 cm patchy area of masslike consolidation within the posteromedial aspect of the left upper lobe, which may represent a primary neoplasm.  Hx of bladder cancer s/p resection and urostomy   Hx basal cell ca L cheek, R low back  Oncology on board  Plan for biopsy   eliquis will change to heparin IV in preparation   Right ventricle mass  CT head 59mm mass R lateral ventricle.  MRI brain - R lateral ventricle w/ pedunculated mass.   Nonemergent contrast-enhanced brain MRI is recommended for further evaluation.  In the setting of possible lung cancer with mets, this could be brain mets  Agree with neuro-onco involvement as outpt  Atrial Fibrillation  Home anticoagulation:  Eliquis (apixaban) daily continued in the hospital  Now on eliquis 2.5mg  bid  Given likelihood of biopsy, will recommend change eliquis to heparin IV . Further AC regimen after biopsy is yet to be determined.    ?? Carotid dz   R ICA 40-59%, L ICA 60-79% 2014   1-39% bilateral in 09/2017   CUS 08/24/19 - 1-39%  bilateral  Hypertension  Stable . BP goal normotensive  Hyperlipidemia  Home meds:  crestor 5, resumed in hospital  LDL 47, goal < 70  Continue statin at discharge  Other Stroke Risk Factors  Advanced age  Former Cigarette smoker, quit 42 yrs ago  Former smokeless tobacco user  ETOH use, alcohol level <10, advised to drink no more than 2 drink(s) a day (curret 21 drinks/week)  Coronary artery disease s/p CABG 1983  PVD  Other Active Problems CKD - stage 4 - creatinine - 2.08->1.80->1.58 Aortic Atherosclerosis (ICD10-I70.0).  Leukocytosis - 12.7->12.8 (afebrile)  Severe protein calorie malnutrition -> nutrition consult planned  Code Status - DNR  Hospital day # 1  Rosalin Hawking, MD PhD Stroke Neurology 08/25/2019 4:59 PM  To contact Stroke Continuity provider, please refer to [email or web address] After hours, contact General Neurology

## 2019-08-25 NOTE — Evaluation (Signed)
Occupational Therapy Evaluation Patient Details Name: [name]. MRN: [id number] DOB: Jan 23, 1932 Today's Date: 08/25/2019    History of Present Illness Pt is an 86 y/o male with PMHx including bladder cancer; CAD s/p CABG; carotid stenosis; stage 3 CKD; afib on Eliquis; HTN; HLD; and PVD presenting with acute LUE weakness. Of note pt also has had chronic dysphagia for a couple of weeks, had an EGD on 5/11; he had esophageal dilation with some improvement. Given LUE weakness CT of C-Spine was ordered, C-spine was unremarkable but apices of lungs with "innumerable nodules" concerning for metastatic bladder cancer noted. MRI of the brain revealed chronic lacunar infarcts within the right basal ganglia and within the cerebellum but overall nondiagnostic due to artifact with metallic objects. Suspected small R MCA motor strip infarct.    Clinical Impression   This 86 y/o male presents with the above. PTA pt reports mod independence with ADL and functional mobility using SPC, was driving and living alone. Today pt presenting with LUE deficits (most notably in L hand/digit strength and fine motor coordination), and decreased activity tolerance impacting his functional performance. Pt performing room level mobility without AD with overall minA; he currently requires minA for LB ADL, setup assist for seated UB and grooming ADL. Pt grossly with 2/4 DOE with activity, SpO2 94% on RA post mobility tasks. He will benefit from continued acute OT services, recommend follow up OT services (Eugenio Saenz vs outpt neuro OT) to further address LUE deficits and to maximize his overall safety and independence with ADL and mobility. Will follow.     Follow Up Recommendations  Home health OT;Outpatient OT;Supervision/Assistance - 24 hour(HH vs neuro outpt for LUE)    Equipment Recommendations  Tub/shower seat           Precautions / Restrictions Precautions Precautions: Fall Restrictions Weight Bearing Restrictions: No       Mobility Bed Mobility Overal bed mobility: Needs Assistance Bed Mobility: Supine to Sit     Supine to sit: Min guard     General bed mobility comments: for lines and safety, HOB elevated   Transfers Overall transfer level: Needs assistance Equipment used: None Transfers: Sit to/from Stand Sit to Stand: Min guard         General transfer comment: close minguard for lines, balance and safety    Balance Overall balance assessment: Needs assistance Sitting-balance support: Feet supported Sitting balance-Leahy Scale: Good     Standing balance support: No upper extremity supported;During functional activity Standing balance-Leahy Scale: Fair                             ADL either performed or assessed with clinical judgement   ADL Overall ADL's : Needs assistance/impaired Eating/Feeding: Set up;Sitting   Grooming: Set up;Sitting   Upper Body Bathing: Set up;Supervision/ safety;Sitting   Lower Body Bathing: Min guard;Sit to/from stand   Upper Body Dressing : Set up;Sitting   Lower Body Dressing: Min guard;Sit to/from stand   Toilet Transfer: Minimal assistance;Ambulation Toilet Transfer Details (indicate cue type and reason): simulated via transfer to recliner, light minA Toileting- Clothing Manipulation and Hygiene: Min guard;Sit to/from stand       Functional mobility during ADLs: Minimal assistance General ADL Comments: pt with L hand weakness, decreased activity tolerance, grossly 2/4 DOE with room level activity      Vision Baseline Vision/History: Wears glasses Wears Glasses: At all times Patient Visual Report: No change from baseline  Vision Assessment?: No apparent visual deficits Additional Comments: will continue to assess      Perception     Praxis      Pertinent Vitals/Pain Pain Assessment: No/denies pain     Hand Dominance Right   Extremity/Trunk Assessment Upper Extremity Assessment Upper Extremity Assessment: LUE  deficits/detail LUE Deficits / Details: weak grip strength and impaired coordination noted  LUE Sensation: WNL LUE Coordination: decreased fine motor   Lower Extremity Assessment Lower Extremity Assessment: Defer to PT evaluation   Cervical / Trunk Assessment Cervical / Trunk Assessment: Normal   Communication Communication Communication: HOH   Cognition Arousal/Alertness: Awake/alert Behavior During Therapy: WFL for tasks assessed/performed Overall Cognitive Status: Within Functional Limits for tasks assessed                                     General Comments  SpO2 94% on RA post room level mobility     Exercises Exercises: Other exercises Other Exercises Other Exercises: encouraged continued use of LUE as much as possible, will follow up for formal HEP   Shoulder Instructions      Home Living Family/patient expects to be discharged to:: Private residence Living Arrangements: Alone Available Help at Discharge: Family(son) Type of Home: House Home Access: Level entry     Home Layout: One level     Bathroom Shower/Tub: Teacher, early years/pre: Handicapped height     Home Equipment: Environmental consultant - 2 wheels;Grab bars - tub/shower;Cane - single point          Prior Functioning/Environment Level of Independence: Independent with assistive device(s)        Comments: uses SPC, driving, performing iADLs        OT Problem List: Decreased strength;Decreased range of motion;Decreased activity tolerance;Impaired balance (sitting and/or standing);Decreased coordination;Cardiopulmonary status limiting activity;Impaired UE functional use      OT Treatment/Interventions: Self-care/ADL training;Therapeutic exercise;Neuromuscular education;Energy conservation;DME and/or AE instruction;Therapeutic activities;Patient/family education;Balance training    OT Goals(Current goals can be found in the care plan section) Acute Rehab OT Goals Patient Stated  Goal: return home, maintain independence  OT Goal Formulation: With patient Time For Goal Achievement: 09/08/19 Potential to Achieve Goals: Good  OT Frequency: Min 2X/week   Barriers to D/C:            Co-evaluation              AM-PAC OT "6 Clicks" Daily Activity     Outcome Measure Help from another person eating meals?: A Little Help from another person taking care of personal grooming?: A Little Help from another person toileting, which includes using toliet, bedpan, or urinal?: A Little Help from another person bathing (including washing, rinsing, drying)?: A Little Help from another person to put on and taking off regular upper body clothing?: A Little Help from another person to put on and taking off regular lower body clothing?: A Little 6 Click Score: 18   End of Session Equipment Utilized During Treatment: Gait belt Nurse Communication: Mobility status  Activity Tolerance: Patient tolerated treatment well Patient left: in chair;with call bell/phone within reach;with chair alarm set  OT Visit Diagnosis: Muscle weakness (generalized) (M62.81);Unsteadiness on feet (R26.81);Hemiplegia and hemiparesis Hemiplegia - Right/Left: Left Hemiplegia - dominant/non-dominant: Non-Dominant Hemiplegia - caused by: Cerebral infarction                Time: 1093-2355 OT Time Calculation (min): 23 min  Charges:  OT General Charges $OT Visit: 1 Visit OT Evaluation $OT Eval Moderate Complexity: 1 Mod OT Treatments $Self Care/Home Management : 8-22 mins  Lou Cal, OT Acute Rehabilitation Services Pager [phone] Office [place] 08/25/2019, 11:17 AM

## 2019-08-26 DIAGNOSIS — R918 Other nonspecific abnormal finding of lung field: Secondary | ICD-10-CM

## 2019-08-26 LAB — APTT: aPTT: 149 seconds — ABNORMAL HIGH (ref 24–36)

## 2019-08-26 MED ORDER — HEPARIN (PORCINE) 25000 UT/250ML-% IV SOLN
850.0000 [IU]/h | INTRAVENOUS | Status: DC
  Administered 2019-08-26: 850 [IU]/h via INTRAVENOUS
  Filled 2019-08-26: qty 250

## 2019-08-26 MED ORDER — HEPARIN (PORCINE) 25000 UT/250ML-% IV SOLN
700.0000 [IU]/h | INTRAVENOUS | Status: DC
  Administered 2019-08-26: 600 [IU]/h via INTRAVENOUS
  Filled 2019-08-26: qty 250

## 2019-08-26 NOTE — Progress Notes (Signed)
Occupational Therapy Treatment Patient Details Name: [name]. MRN: [id number] DOB: Aug 24, 1931 Today's Date: 08/26/2019    History of present illness Pt is an 86 y/o male with PMHx including bladder cancer; CAD s/p CABG; carotid stenosis; stage 3 CKD; afib on Eliquis; HTN; HLD; and PVD presenting with acute LUE weakness. Of note pt also has had chronic dysphagia for a couple of weeks, had an EGD on 5/11; he had esophageal dilation with some improvement. Given LUE weakness CT of C-Spine was ordered, C-spine was unremarkable but apices of lungs with "innumerable nodules" concerning for metastatic bladder cancer noted. MRI of the brain revealed chronic lacunar infarcts within the right basal ganglia and within the cerebellum but overall nondiagnostic due to artifact with metallic objects. Suspected small R MCA motor strip infarct.    OT comments  Pt. Seen for skilled OT treatment session.  Reports feeling "bad all over" and declines eob/oob activity today.  Agreeable to light bed mobility/repositioning max a.  Grooming task set up and introduction of theraputty and HEP for LUE.  Pt. Pleased with the materials as he had stated multiple times "I just wish I could get this hand to work again".  Continue with return demo of all fine motor exercises next session.  Mostly demonstration today secondary to pt. Reported and visible fatigue.   Follow Up Recommendations  Home health OT;Outpatient OT;Supervision/Assistance - 24 hour    Equipment Recommendations  Tub/shower seat    Recommendations for Other Services      Precautions / Restrictions Precautions Precautions: Fall       Mobility Bed Mobility               General bed mobility comments: max a to pull pt. up in bed with pt. assisting with pushing up in bed with b les.  declined eob/oob  Transfers                 General transfer comment: declined eob/oob today    Balance                                            ADL either performed or assessed with clinical judgement   ADL Overall ADL's : Needs assistance/impaired Eating/Feeding: Set up;Sitting;Bed level Eating/Feeding Details (indicate cue type and reason): assisted pt. with repositioning to create ease with reaching for cereal bowl Grooming: Set up;Bed level                                 General ADL Comments: pt. declined eob/oob secondary to "feeling weak and bad all over".  agreeable to bed level grooming tasks and introduction of theraputty and fine motor activities to aide in strengthening and functional use of left hand and digits     Vision       Perception     Praxis      Cognition Arousal/Alertness: Lethargic Behavior During Therapy: WFL for tasks assessed/performed Overall Cognitive Status: Within Functional Limits for tasks assessed                                          Exercises Hand Exercises Digit Composite Flexion: 5 reps;AAROM;Self ROM;Both;Supine Composite Extension: AAROM;Self ROM;5 reps;Both;Supine Digit  Composite Abduction: AAROM;Self ROM;Both;5 reps;Supine Digit Composite Adduction: AAROM;Self ROM;Both;5 reps;Supine Hand Activities Turning Pages: Left;Supine;Other (comment)(provided demo of how to use items in room to practice turning pages) Open and Close Containers: Both;Supine Other Exercises Other Exercises: provided yellow theraputty with handouts. provided demo of several of the exercises. reviewed tray items and menu for examples of how to integrate fucntional use of L hand. pt. eager and stated multiple times "i just wish i could get this hand stronger". Other Exercises: educated on self rom, using right hand to aide in L hand/digit exercises   Shoulder Instructions       General Comments  pt. Tearful when mentioning his wife of 62 years passed a way just a few months ago.  Able to share stories of how they met at college.  Pt. Is a retired Hotel manager for [employer].  Has 2 sons. One just retired also from the [employer] and his other son does something with boats/marina.  He has one granddaughter.  All family is local.      Pertinent Vitals/ Pain       Pain Assessment: Faces Faces Pain Scale: Hurts little more Pain Location: "i just feel bad all over"  Home Living                                          Prior Functioning/Environment              Frequency  Min 2X/week        Progress Toward Goals  OT Goals(current goals can now be found in the care plan section)  Progress towards OT goals: Progressing toward goals     Plan      Co-evaluation                 AM-PAC OT "6 Clicks" Daily Activity     Outcome Measure   Help from another person eating meals?: A Little Help from another person taking care of personal grooming?: A Little Help from another person toileting, which includes using toliet, bedpan, or urinal?: A Little Help from another person bathing (including washing, rinsing, drying)?: A Little Help from another person to put on and taking off regular upper body clothing?: A Little Help from another person to put on and taking off regular lower body clothing?: A Little 6 Click Score: 18    End of Session    OT Visit Diagnosis: Muscle weakness (generalized) (M62.81);Unsteadiness on feet (R26.81);Hemiplegia and hemiparesis Hemiplegia - Right/Left: Left Hemiplegia - dominant/non-dominant: Non-Dominant Hemiplegia - caused by: Cerebral infarction   Activity Tolerance Patient limited by fatigue;Patient limited by lethargy   Patient Left with call bell/phone within reach;with bed alarm set;in bed   Nurse Communication          Time: 5916-3846 OT Time Calculation (min): 16 min  Charges: OT General Charges $OT Visit: 1 Visit OT Treatments $Self Care/Home Management : 8-22 mins  Sonia Baller, COTA/L Acute Rehabilitation [phone]   Janice Coffin 08/26/2019, 10:46 AM

## 2019-08-26 NOTE — Progress Notes (Addendum)
ANTICOAGULATION CONSULT NOTE - Initial Consult  Pharmacy Consult for heparin Indication: 5/21 CVA and atrial fibrillation (apixaban on hold)  Allergies  Allergen Reactions  . Rivaroxaban Hives, Itching and Swelling    Hives, swelling, itching Hives, swelling, itching  . Lovastatin Other (See Comments)    muscle aches  . Pantoprazole Other (See Comments)    PPI likely caused blood in urine.    . Proton Pump Inhibitors Other (See Comments)    Blood in urine while on PPI PPI likely caused blood in urine.  Blood in urine while on PPI  . Zetia [Ezetimibe] Diarrhea    Plus blood in urine, nausea, fatigue, dizzyness (possibility this was due to bladder cancer and not the medication)    Patient Measurements: Height: 5\' 11"  (180.3 cm) Weight: 65.3 kg (144 lb) IBW/kg (Calculated) : 75.3 Heparin Dosing Weight: 65kg  Vital Signs: Temp: 97.7 F (36.5 C) (05/23 1944) Temp Source: Oral (05/23 1944) BP: 152/58 (05/23 1944) Pulse Rate: 80 (05/23 1944)  Labs: Recent Labs    08/24/19 0422 08/24/19 0422 08/24/19 0431 08/25/19 0307 08/26/19 2058  HGB 14.5   < > 15.6 14.0  --   HCT 44.9  --  46.0 43.0  --   PLT 381  --   --  344  --   APTT 35  --   --   --  149*  LABPROT 14.9  --   --   --   --   INR 1.2  --   --   --   --   CREATININE 2.08*  --  1.80* 1.58*  --    < > = values in this interval not displayed.    Estimated Creatinine Clearance: 31 mL/min (A) (by C-G formula based on SCr of 1.58 mg/dL (H)).   Assessment: 86 yo M on apixaban PTA for afib (CHADS2VASc = 6) now with small CVA. Apixaban held for procedures, last dose 5/22 8:50am. Now to heparin IV; Per MD, no bolus and low goal for CVA. H/H, plt stable.   Initial aPTT high 149 - drawn on opposite side of body as infusion; so lvl drawn appropriately   Goal of Therapy:  Heparin level 0.3 -0.5 units/ml  APTT 66-85 sec Monitor platelets by anticoagulation protocol: Yes   Plan:  Hold heparin x 1 hr Resume at 2300 -  600 units/hr Recheck lvls at [place], PharmD, BCPS, BCCCP Clinical Pharmacist [phone]  Please check AMION for all [place] numbers  08/26/2019 9:50 PM

## 2019-08-26 NOTE — Progress Notes (Signed)
ANTICOAGULATION CONSULT NOTE - Initial Consult  Pharmacy Consult for heparin Indication: 5/21 CVA and atrial fibrillation (apixaban on hold)  Allergies  Allergen Reactions  . Rivaroxaban Hives, Itching and Swelling    Hives, swelling, itching Hives, swelling, itching  . Lovastatin Other (See Comments)    muscle aches  . Pantoprazole Other (See Comments)    PPI likely caused blood in urine.    . Proton Pump Inhibitors Other (See Comments)    Blood in urine while on PPI PPI likely caused blood in urine.  Blood in urine while on PPI  . Zetia [Ezetimibe] Diarrhea    Plus blood in urine, nausea, fatigue, dizzyness (possibility this was due to bladder cancer and not the medication)    Patient Measurements: Height: 5\' 11"  (180.3 cm) Weight: 65.3 kg (144 lb) IBW/kg (Calculated) : 75.3 Heparin Dosing Weight: 65kg  Vital Signs: Temp: 97.4 F (36.3 C) (05/23 0727) Temp Source: Axillary (05/23 0727) BP: 155/94 (05/23 0727) Pulse Rate: 78 (05/23 0727)  Labs: Recent Labs    08/24/19 0422 08/24/19 0422 08/24/19 0431 08/25/19 0307  HGB 14.5   < > 15.6 14.0  HCT 44.9  --  46.0 43.0  PLT 381  --   --  344  APTT 35  --   --   --   LABPROT 14.9  --   --   --   INR 1.2  --   --   --   CREATININE 2.08*  --  1.80* 1.58*   < > = values in this interval not displayed.    Estimated Creatinine Clearance: 31 mL/min (A) (by C-G formula based on SCr of 1.58 mg/dL (H)).   Assessment: 86 yo M on apixaban PTA for afib (CHADS2VASc = 6) now with small CVA. Apixaban held for procedures, last dose 5/22 8:50am. Pharmacy is consulted for heparin infusion. Per MD, no bolus and low goal for CVA. H/H, plt stable.   Goal of Therapy:  Heparin level 0.3 -0.5 units/ml  APTT 66-85 sec Monitor platelets by anticoagulation protocol: Yes   Plan:  Start heparin 850 units/ hr, no bolus F/u 8hr aPTT  F/u aPTT until correlates with heparin level  Monitor daily aPTT, HL, CBC/plt Monitor for  signs/symptoms of bleeding  F/u plan for procedures   Benetta Spar, PharmD, BCPS, BCCP Clinical Pharmacist  Please check AMION for all [place] phone numbers After 10:00 PM, call [place]
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
## 2019-08-26 NOTE — Progress Notes (Signed)
PT Cancellation Note  Patient Details Name: [name]. MRN: [id number] DOB: February 11, 1932   Cancelled Treatment:    Reason Eval/Treat Not Completed: Patient declined, no reason specified - Pt continues to process new cancer diagnosis and defers activity at this time. Encouraged pt to be up for meals to help with lung function and to minimize loss of strength, pt verbalized understanding.  Will re-attempt and recommend nursing assist to chair if pt willing.    Kearney Hard [facility] 08/26/2019, 11:57 AM

## 2019-08-26 NOTE — Progress Notes (Signed)
PROGRESS NOTE  [name].  DOB: 04-29-1931  PCP: Tonia Ghent, MD ACZ:[id number]  DOA: 08/24/2019  LOS: 2 days   Chief Complaint  Patient presents with  . Code Stroke   Brief narrative: Said Rueb. is an 86 y.o. male with a PMHx of atrial fibrillation  , bladder cancer s/p resection with urostomy, CAD s/p CABG in 1983, bilateral carotid artery stenosis, CKD stage III, HTN, HLD and PVD. Patient presented to the ED on 5/21 from home with new onset of LUE weakness that he noticed on awakening at 0300, after going to bed at 1900 while still at his baseline.  EMS noted some LUE drift and left hand weakness.  Of note, he is on Eliquis for his atrial fibrillation. He has had chronic dysphagia for a couple of weeks, had an EGD on 5/11; he had esophageal dilation with some improvement.  In the ED, he was hemodynamically stable.  Admitted for stroke work-up. Neurology consult obtained.  CT scan of head did not show any acute infarct but showed remote appearing infarct in the right caudate and cerebellum.  He was also found to have 11 mm mass at the right lateral ventricle. MRI confirmed a 12 mm pedunculated mass within the body of the right lateral ventricle. CT scan of chest, abdomen and pelvis with contrast was obtained. It showed 1. innumerable subcentimeter noncalcified lung nodules scattered throughout both lungs, consistent with metastatic disease. 2. 5.2 cm x 3.0 cm patchy area of masslike consolidation within the posteromedial aspect of the left upper lobe, which may represent a primary neoplasm. 3. Small to moderate sized patchy areas of right upper lobe, posterior right middle lobe and superior right lower lobe atelectasis and/or infiltrate. 4. Moderate size left pleural effusion, and small right pleural effusion.  Subjective: Patient was seen and examined this morning.  Lying on bed.  Not in distress.  Left hand weakness improving. Seen by oncology yesterday. Pulmonary  consult called today.  Assessment/Plan: Acute stroke -Presented with acute onset L hand weakness.  Continues to have weakness on handgrip. -CT scan head and MRI brain findings as above did not show any acute infarct but showed remote appearing infarct in the right carotid and cerebellum. -Neurology consult appreciated.  He probably has a small right MCA moderate restrictive infarct despite being on Eliquis. -Lipid panel with HDL 45, LDL 47.  A1c 6.1.   -TSH low at 0.031.  Pending free T4 -Echocardiogram with EF 60 to 65%, indeterminate diastolic function. -Bilateral carotid artery Doppler with no significant stenosis. -Pending report of transcranial Doppler -PT/OT/ST eval obtained. -Continue home medicines that include Eliquis, aspirin, statin.  Possible lung cancer with brain mets -Imaging findings as above showing left upper lobe mass with moderate-sized left pleural effusion, bilateral pulmonary nodules as well as a 12 mm pedunculated mass in the right lateral ventricular brain. -Oncology consult obtained. -As recommended, I called pulmonology for attempting Endo bronchial sampling. -Eliquis on hold.  -We will plan for thoracentesis tomorrow for left pleural effusion.  Cardiovascular issues : HTN, HLD, CAD sp CABG in 1983, PAD, A. fib -Home meds include Toprol 25 mg daily along with Eliquis, aspirin and Crestor.   -Eliquis on hold for potential biopsy.  I have started him on heparin drip in the interval. -Continue other cardiac meds. -Continue to monitor in telemetry. Continue to monitor blood pressure  CKD stage IV -Appears to have progressed to stage 4 CKD and is stable at this time  Bladder cancer -Ileostomy in place, otherwise no current issues  Severe protein calorie malnutrition -Nutrition consult obtained.  Dietary supplements ordered.  Mobility: PT OT eval Code Status:  DNR per chart  DVT prophylaxis:  Heparin drip Antimicrobials:  None Fluid: None Diet:  Cardiac diet  Consultants: Neurology Family Communication:  None at bedside  Status is: Inpatient  Remains inpatient appropriate because:Hemodynamically unstable, Ongoing diagnostic testing needed not appropriate for outpatient work up and Inpatient level of care appropriate due to severity of illness   Dispo: The patient is from: Home              Anticipated d/c is to: Home              Anticipated d/c date is: 2 days              Patient currently is not medically stable to d/c.  Antimicrobials: Anti-infectives (From admission, onward)   None        Code Status: DNR   Diet Order            Diet Heart Room service appropriate? Yes; Fluid consistency: Thin  Diet effective ____              Infusions:  . sodium chloride 50 mL/hr at 08/25/19 1210    Scheduled Meds: .  stroke: mapping our early stages of recovery book   Does not apply Once  . allopurinol  100 mg Oral QODAY  . aspirin EC  81 mg Oral Daily  . famotidine  40 mg Oral BID  . feeding supplement (ENSURE ENLIVE)  237 mL Oral TID BM  . ferrous sulfate  325 mg Oral QODAY  . metoprolol succinate  25 mg Oral Daily  . multivitamin with minerals  1 tablet Oral Daily  . rosuvastatin  5 mg Oral Daily    PRN meds: acetaminophen **OR** acetaminophen (TYLENOL) oral liquid 160 mg/5 mL **OR** acetaminophen, polyethylene glycol   Objective: Vitals:   08/26/19 0446 08/26/19 0727  BP: (!) 141/97 (!) 155/94  Pulse: 74 78  Resp: 18 17  Temp: 98.4 F (36.9 C) (!) 97.4 F (36.3 C)  SpO2: 94% 96%    Intake/Output Summary (Last 24 hours) at 08/26/2019 1047 Last data filed at 08/26/2019 1000 Gross per 24 hour  Intake --  Output 1800 ml  Net -1800 ml   Filed Weights   08/24/19 0436  Weight: 65.3 kg   Weight change:  Body mass index is 20.08 kg/m.   Physical Exam: General exam: Appears calm and comfortable.  Thin built Skin: No rashes, lesions or ulcers. HEENT: Atraumatic, normocephalic, supple neck, no  obvious bleeding Lungs: Diminished breath sounds on left base. Otherwise clear to auscultation CVS: Regular rate and rhythm, no murmur GI/Abd soft, nontender, nondistended, bowel sound present CNS: Alert, awake, oriented x3 Psychiatry: Mood appropriate Extremities: No pedal edema, no calf tenderness  Data Review: I have personally reviewed the laboratory data and studies available.  Recent Labs  Lab 08/24/19 0422 08/24/19 0431 08/25/19 0307  WBC 12.7*  --  12.8*  NEUTROABS 9.0*  --   --   HGB 14.5 15.6 14.0  HCT 44.9 46.0 43.0  MCV 91.3  --  89.6  PLT 381  --  344   Recent Labs  Lab 08/24/19 0422 08/24/19 0431 08/25/19 0307  NA 137 136 134*  K 4.3 4.4 4.2  CL 100 99 101  CO2 26  --  22  GLUCOSE 98 89 102*  BUN 37* 46* 34*  CREATININE 2.08* 1.80* 1.58*  CALCIUM 9.7  --  9.5  PHOS  --   --  2.9    Signed, Terrilee Croak, MD [facility] Pager: [phone] (Secure Chat preferred). 08/26/2019

## 2019-08-26 NOTE — Progress Notes (Signed)
STROKE TEAM PROGRESS NOTE   INTERVAL HISTORY Patient lying in bed, no family at the bedside.  Awake alert, orientated.  Pending tomorrow for biopsy.  Still has left hand mild weakness.   Vitals:   08/25/19 2325 08/26/19 0446 08/26/19 0727 08/26/19 1145  BP: 129/89 (!) 141/97 (!) 155/94 (!) 160/79  Pulse: 70 74 78 75  Resp: 18 18 17 17   Temp: 98.1 F (36.7 C) 98.4 F (36.9 C) (!) 97.4 F (36.3 C) 99.5 F (37.5 C)  TempSrc: Oral Oral Axillary Axillary  SpO2: 93% 94% 96% 95%  Weight:      Height:        CBC:  Recent Labs  Lab 08/24/19 0422 08/24/19 0422 08/24/19 0431 08/25/19 0307  WBC 12.7*  --   --  12.8*  NEUTROABS 9.0*  --   --   --   HGB 14.5   < > 15.6 14.0  HCT 44.9   < > 46.0 43.0  MCV 91.3  --   --  89.6  PLT 381  --   --  344   < > = values in this interval not displayed.    Basic Metabolic Panel:  Recent Labs  Lab 08/24/19 0422 08/24/19 0422 08/24/19 0431 08/25/19 0307  NA 137   < > 136 134*  K 4.3   < > 4.4 4.2  CL 100   < > 99 101  CO2 26  --   --  22  GLUCOSE 98   < > 89 102*  BUN 37*   < > 46* 34*  CREATININE 2.08*   < > 1.80* 1.58*  CALCIUM 9.7  --   --  9.5  PHOS  --   --   --  2.9   < > = values in this interval not displayed.   Lipid Panel:     Component Value Date/Time   CHOL 114 08/24/2019 1000   CHOL 206 (H) 07/11/2013 0829   TRIG 110 08/24/2019 1000   HDL 45 08/24/2019 1000   HDL 68 07/11/2013 0829   CHOLHDL 2.5 08/24/2019 1000   VLDL 22 08/24/2019 1000   LDLCALC 47 08/24/2019 1000   LDLCALC 88 07/11/2013 0829   HgbA1c:  Lab Results  Component Value Date   HGBA1C 6.1 (H) 08/24/2019   Urine Drug Screen:     Component Value Date/Time   LABOPIA NONE DETECTED 08/24/2019 1000   COCAINSCRNUR NONE DETECTED 08/24/2019 1000   LABBENZ NONE DETECTED 08/24/2019 1000   AMPHETMU NONE DETECTED 08/24/2019 1000   THCU NONE DETECTED 08/24/2019 1000   LABBARB NONE DETECTED 08/24/2019 1000    Alcohol Level     Component Value  Date/Time   ETH <10 08/24/2019 0422    IMAGING past 24 hours  CT CHEST WO CONTRAST 08/24/2019 IMPRESSION:  1. Innumerable subcentimeter noncalcified lung nodules scattered throughout both lungs, consistent with metastatic disease.  2. 5.2 cm x 3.0 cm patchy area of masslike consolidation within the posteromedial aspect of the left upper lobe, which may represent a primary neoplasm.  3. Small to moderate sized patchy areas of right upper lobe, posterior right middle lobe and superior right lower lobe atelectasis and/or infiltrate.  4. Moderate size left pleural effusion, and small right pleural effusion.  5. Cholelithiasis without evidence of cholecystitis.  6. Moderate-sized hiatal hernia.  7. Sigmoid diverticulosis.   Aortic Atherosclerosis (ICD10-I70.0).   CT ABDOMEN PELVIS WO CONTRAST 08/24/2019 IMPRESSION:  1. Innumerable subcentimeter noncalcified lung nodules scattered throughout  both lungs, consistent with metastatic disease.  2. 5.2 cm x 3.0 cm patchy area of masslike consolidation within the posteromedial aspect of the left upper lobe, which may represent a primary neoplasm.  3. Small to moderate sized patchy areas of right upper lobe, posterior right middle lobe and superior right lower lobe atelectasis and/or infiltrate.  4. Moderate size left pleural effusion, and small right pleural effusion.  5. Cholelithiasis without evidence of cholecystitis.  6. Moderate-sized hiatal hernia.  7. Sigmoid diverticulosis.   Aortic Atherosclerosis (ICD10-I70.0).   CT HEAD WO CONTRAST 08/25/2019 IMPRESSION:  Stable head CT. No visible acute or evolving large vessel territory infarct. No other new intracranial abnormality.   Transthoracic Echocardiogram 08/24/19  IMPRESSIONS  1. Left ventricular ejection fraction, by estimation, is 60 to 65%. The  left ventricle has normal function. Left ventricular endocardial border  not optimally defined to evaluate regional wall motion. There is  moderate  left ventricular hypertrophy. Left  ventricular diastolic parameters are indeterminate.  2. Right ventricular systolic function is mildly reduced. The right  ventricular size is normal. There is normal pulmonary artery systolic  pressure. The estimated right ventricular systolic pressure is 35.4 mmHg.  3. The mitral valve is normal in structure. Mild mitral valve  regurgitation. No evidence of mitral stenosis.  4. Tricuspid valve regurgitation is moderate.  5. The aortic valve is severely calcified with severely reduced cusp  mobility. Aortic valve regurgitation is not visualized. Mild to moderate  aortic valve stenosis. Aortic valve mean gradient measures 17.0 mmHg. This  is likely an underestimate of true  severity due to suboptimal Doppler alignment and image quality.  6. The inferior vena cava is normal in size with greater than 50%  respiratory variability, suggesting right atrial pressure of 3 mmHg.   Bilateral Carotid Dopplers  Summary:  Right Carotid: Velocities in the right ICA are consistent with a 1-39% stenosis.  Left Carotid: Velocities in the left ICA are consistent with a 1-39% stenosis.  Vertebrals: Bilateral vertebral arteries demonstrate antegrade flow.  Subclavians: Normal flow hemodynamics were seen in bilateral subclavian arteries.   Transcranial Doppler  Indications: Stroke.  Limitations: Poor acoustic windows Limitations for diagnostic windows: Unable to insonate right transtemporal  window.  Performing Technologist: Maudry Mayhew MHA, RDMS, RVT, RDCS  Examination Guidelines: A complete evaluation includes B-mode imaging,  spectral  Doppler, color Doppler, and power Doppler as needed of all accessible  portions  of each vessel. Bilateral testing is considered an integral part of a  complete  examination. Limited examinations for reoccurring indications may be  performed  as noted.    +----------+-------------+----------+-----------+------------------+  RIGHT TCD Right VM (cm)Depth (cm)Pulsatility   Comment     +----------+-------------+----------+-----------+------------------+  MCA                      Unable to insonate  +----------+-------------+----------+-----------+------------------+  ACA                      Unable to insonate  +----------+-------------+----------+-----------+------------------+  Term ICA                   Unable to insonate  +----------+-------------+----------+-----------+------------------+  PCA                      Unable to insonate  +----------+-------------+----------+-----------+------------------+  Opthalmic   15.00         1.47             +----------+-------------+----------+-----------+------------------+  ICA siphon  16.00         1.18             +----------+-------------+----------+-----------+------------------+  Vertebral   -22.00         1.10             +----------+-------------+----------+-----------+------------------+     +----------+------------+----------+-----------+------------------+  LEFT TCD Left VM (cm)Depth (cm)Pulsatility   Comment     +----------+------------+----------+-----------+------------------+  MCA      18.00         1.48             +----------+------------+----------+-----------+------------------+  ACA                     Unable to insonate  +----------+------------+----------+-----------+------------------+  Term ICA                  Unable to insonate  +----------+------------+----------+-----------+------------------+  PCA                     Unable to insonate   +----------+------------+----------+-----------+------------------+  Opthalmic                  Unable to insonate  +----------+------------+----------+-----------+------------------+  ICA siphon  14.00         1.22             +----------+------------+----------+-----------+------------------+  Vertebral   -16.00         0.89             +----------+------------+----------+-----------+------------------+   +------------+-------+------------------+        VM cm/s   Comment     +------------+-------+------------------+  Prox Basilar    Unable to insonate  +------------+-------+------------------+      PHYSICAL EXAM  Temp:  [97.3 F (36.3 C)-99.5 F (37.5 C)] 99.5 F (37.5 C) (05/23 1145) Pulse Rate:  [64-81] 75 (05/23 1145) Resp:  [17-20] 17 (05/23 1145) BP: (114-170)/(79-97) 160/79 (05/23 1145) SpO2:  [90 %-96 %] 95 % (05/23 1145)  General - Well nourished, well developed, in no apparent distress.  Ophthalmologic - fundi not visualized due to noncooperation.  Cardiovascular - irregularly irregular heart rate and rhythm.  Mental Status -  Level of arousal and orientation to time, place, and person were intact. Language including expression, naming, repetition, comprehension was assessed and found intact. Fund of Knowledge was assessed and was intact.  Cranial Nerves II - XII - II - Visual field intact OU. III, IV, VI - Extraocular movements intact. V - Facial sensation intact bilaterally. VII - Facial movement intact bilaterally. VIII - Hearing & vestibular intact bilaterally. X - Palate elevates symmetrically. XI - Chin turning & shoulder shrug intact bilaterally. XII - Tongue protrusion intact.  Motor Strength - The patient's strength was symmetrical in all extremities except pronator drift on the left and left wrist extension 4+/5 and finger grip 3+/5 with significant  dexterity diffeiculty.  Bulk was normal and fasciculations were absent.   Motor Tone - Muscle tone was assessed at the neck and appendages and was normal.  Reflexes - The patient's reflexes were symmetrical in all extremities and he had no pathological reflexes.  Sensory - Light touch, temperature/pinprick were assessed and were symmetrical.    Coordination - The patient had normal movements in the right hands with no ataxia or dysmetria, but left FTN mild dysmetria and slow action but still proportional to his weakness.  Tremor was absent.  Gait and Station - deferred.   ASSESSMENT/PLAN Mr. TREY GULBRANSON  Brooke Bonito. is a 86 y.o. male with history of atrial fibrillation on eliquis, bladder cancer s/p resection with urostomy, CAD s/p CABG in 1983, bilateral carotid artery stenosis, CKD stage III, HTN, HLD and PVD presenting with LUE weakness.   Stroke - possible small right MCA motor strip infarct - likely due to hypercoagulable state from advanced malignancy vs. afib even on eliquis. Do not think this is brain metastasis given rapid improvement.   Code Stroke CT head No acute abnormality. Old R caudate and cerebellar infarcts. ASPECTS 10.     MRI  nondiagnostic DWI due to artifact with metallic objects.  Old R basal ganglia and cerebellar lacunes. Small vessel disease.   Repeat CT 08/25/19 - Stable head CT. No visible acute or evolving large vessel territory infarct. No other new intracranial abnormality.   TCD - lack of window  Carotid Doppler - unremarkable  2D Echo EF 60-65%  LDL 47  HgbA1c 6.1  Eliquis for VTE prophylaxis  aspirin 81 mg daily and Eliquis (apixaban) daily prior to admission, now on aspirin 81 mg daily and heparin IV in preparation of biopsy.   Therapy recommendations:  pending   Disposition:  pending   Possible lung cancer with mets Hx of bladder cancer  Chest CT - Innumerable subcentimeter noncalcified lung nodules scattered throughout both lungs, consistent with  metastatic disease. 5.2 cm x 3.0 cm patchy area of masslike consolidation within the posteromedial aspect of the left upper lobe, which may represent a primary neoplasm.  Hx of bladder cancer s/p resection and urostomy   Hx basal cell ca L cheek, R low back  Oncology on board  Plan for biopsy   Eliquis has change to heparin IV in preparation   Right ventricle mass  CT head 45mm mass R lateral ventricle.  MRI brain - R lateral ventricle w/ pedunculated mass.   Nonemergent contrast-enhanced brain MRI is recommended for further evaluation.  In the setting of possible lung cancer with mets, this could be brain mets but very atypical for location  Agree with neuro-onco involvement as outpt  Atrial Fibrillation  Home anticoagulation:  Eliquis (apixaban) daily continued in the hospital  Now on eliquis 2.5mg  bid  Eliquis changed to heparin IV for biopsy preparation . Further AC regimen after biopsy is yet to be determined.    ?? Carotid dz   R ICA 40-59%, L ICA 60-79% in 2014   1-39% bilateral in 09/2017   CUS 08/24/19 - 1-39% bilateral  Hypertension  Stable . BP goal normotensive  Hyperlipidemia  Home meds:  crestor 5, resumed in hospital  LDL 47, goal < 70  Continue statin at discharge  Other Stroke Risk Factors  Advanced age  Former Cigarette smoker, quit 42 yrs ago  Former smokeless tobacco user  ETOH use, alcohol level <10, advised to drink no more than 2 drink(s) a day (curret 21 drinks/week)  Coronary artery disease s/p CABG 1983  PVD  Other Active Problems CKD - stage 4 - creatinine - 2.08->1.80->1.58 Aortic Atherosclerosis (ICD10-I70.0).  Leukocytosis - 12.7->12.8 (afebrile)  Severe protein calorie malnutrition -> nutrition consult planned  Code Status - DNR  Hospital day # 2  Rosalin Hawking, MD PhD Stroke Neurology 08/26/2019 6:23 PM    To contact Stroke Continuity provider, please refer to [email or web address] After hours, contact General  Neurology

## 2019-08-27 ENCOUNTER — Inpatient Hospital Stay (HOSPITAL_COMMUNITY): Payer: Medicare HMO

## 2019-08-27 ENCOUNTER — Other Ambulatory Visit: Payer: Self-pay | Admitting: Anatomic Pathology & Clinical Pathology

## 2019-08-27 HISTORY — PX: IR THORACENTESIS ASP PLEURAL SPACE W/IMG GUIDE: IMG5380

## 2019-08-27 LAB — CBC
HCT: 44.9 % (ref 39.0–52.0)
Hemoglobin: 14.6 g/dL (ref 13.0–17.0)
MCH: 29.4 pg (ref 26.0–34.0)
MCHC: 32.5 g/dL (ref 30.0–36.0)
MCV: 90.5 fL (ref 80.0–100.0)
Platelets: 320 10*3/uL (ref 150–400)
RBC: 4.96 MIL/uL (ref 4.22–5.81)
RDW: 12.8 % (ref 11.5–15.5)
WBC: 13.8 10*3/uL — ABNORMAL HIGH (ref 4.0–10.5)
nRBC: 0 % (ref 0.0–0.2)

## 2019-08-27 LAB — BODY FLUID CELL COUNT WITH DIFFERENTIAL
Eos, Fluid: 5 %
Lymphs, Fluid: 49 %
Monocyte-Macrophage-Serous Fluid: 33 % — ABNORMAL LOW (ref 50–90)
Neutrophil Count, Fluid: 13 % (ref 0–25)
Total Nucleated Cell Count, Fluid: 169 cu mm (ref 0–1000)

## 2019-08-27 LAB — PROTEIN, PLEURAL OR PERITONEAL FLUID: Total protein, fluid: <3 g/dL

## 2019-08-27 LAB — GRAM STAIN

## 2019-08-27 LAB — LACTATE DEHYDROGENASE, PLEURAL OR PERITONEAL FLUID: LD, Fluid: 71 U/L — ABNORMAL HIGH (ref 3–23)

## 2019-08-27 LAB — ALBUMIN, PLEURAL OR PERITONEAL FLUID: Albumin, Fluid: 1.5 g/dL

## 2019-08-27 LAB — APTT: aPTT: 58 seconds — ABNORMAL HIGH (ref 24–36)

## 2019-08-27 LAB — TROPONIN I (HIGH SENSITIVITY): Troponin I (High Sensitivity): 120 ng/L (ref <18)

## 2019-08-27 LAB — GLUCOSE, PLEURAL OR PERITONEAL FLUID: Glucose, Fluid: 112 mg/dL

## 2019-08-27 LAB — HEPARIN LEVEL (UNFRACTIONATED): Heparin Unfractionated: 0.41 IU/mL (ref 0.30–0.70)

## 2019-08-27 MED ORDER — LIDOCAINE HCL 1 % IJ SOLN
INTRAMUSCULAR | Status: DC | PRN
  Administered 2019-08-27: 10 mL

## 2019-08-27 MED ORDER — NITROGLYCERIN 0.4 MG SL SUBL: 0.4000 mg | SUBLINGUAL_TABLET | SUBLINGUAL | Status: DC | PRN

## 2019-08-27 MED ORDER — NITROGLYCERIN 2 % TD OINT
0.5000 [in_us] | TOPICAL_OINTMENT | Freq: Four times a day (QID) | TRANSDERMAL | Status: DC
  Administered 2019-08-27 – 2019-08-30 (×11): 0.5 [in_us] via TOPICAL
  Filled 2019-08-27: qty 30

## 2019-08-27 MED ORDER — NITROGLYCERIN 0.4 MG SL SUBL
SUBLINGUAL_TABLET | SUBLINGUAL | Status: AC
  Administered 2019-08-27: 0.4 mg via SUBLINGUAL
  Filled 2019-08-27: qty 1

## 2019-08-27 MED ORDER — SODIUM CHLORIDE 0.9 % IV BOLUS
250.0000 mL | Freq: Once | INTRAVENOUS | Status: AC
  Administered 2019-08-27: 250 mL via INTRAVENOUS

## 2019-08-27 MED ORDER — LIDOCAINE HCL 1 % IJ SOLN
INTRAMUSCULAR | Status: AC
  Filled 2019-08-27: qty 20

## 2019-08-27 MED ORDER — MORPHINE SULFATE (PF) 2 MG/ML IV SOLN
1.0000 mg | INTRAVENOUS | Status: DC | PRN
  Administered 2019-08-27 – 2019-08-29 (×3): 1 mg via INTRAVENOUS
  Filled 2019-08-27 (×3): qty 1

## 2019-08-27 NOTE — Progress Notes (Addendum)
Manufacturing engineer [facility]) Hospital Liaison Note:  Received Home with Hospice referral for patient to discharge via private care on hospice services this afternoon from [facility] TOC.   Reached out via telephone to son to confirm - left message after several unsuccessful attempts. Will continue to call son and daughter in law to explain services and support as needed.  Per TOC - No DME needed at this time.   Please send DNR home with patient. Patient will need prescriptions for discharge comfort medications.  Please fax completed discharge summary to [phone] once posted.   [facility] aware of above.   Hospice eligibility under review by [facility] MD - will update this note once confirmed.   Please call with any hospice related questions,  Gar Ponto, RN [facility] HLT  (on AMION) [phone]  UPDATE at 1325 : Patient is Hospice Eligible per [facility] MD UPDATE  1524 - Spoke with son - supported/listended after todays event - "emotionally exhausted".  Explained hospice philosophy/our services/answered questions/gave contact information and encouraged to call as needed. [facility] HLT will follow up tomorrow with TOC/family to see how we can support.

## 2019-08-27 NOTE — Evaluation (Signed)
Physical Therapy Evaluation Patient Details Name: [name]. MRN: [id number] DOB: [birth year] Today's Date: 08/27/2019   History of Present Illness  Pt is an 86 y/o male with PMHx including bladder cancer; CAD s/p CABG; carotid stenosis; stage 3 CKD; afib on Eliquis; HTN; HLD; and PVD presenting with acute LUE weakness. Of note pt also has had chronic dysphagia for a couple of weeks, had an EGD on 5/11; he had esophageal dilation with some improvement. C-spine was unremarkable but apices of lungs with "innumerable nodules" concerning for metastatic bladder cancer noted. MRI brain-chronic lacunar infarcts within the right basal ganglia and within the cerebellum but overall nondiagnostic due to artifact with metallic objects. Suspected small R MCA motor strip infarct. s/p thoracentesis 5/24.  Clinical Impression  Patient presents with generalized weakness, left hand weakness/incoordination, decreased activity tolerance, impaired endurance and impaired mobility s/p above. Pt lives alone and is Mod I with SPC PTA. Son lives near by and is his support. No falls reported. Today, pt tolerated transfers and gait training with Min guard assist for safety and IV pole to simulate cane. Encouraged OOB to chair for all meals. Will follow acutely to maximize independence and mobility prior to return home.     Follow Up Recommendations Home health PT;Supervision for mobility/OOB    Equipment Recommendations  None recommended by PT    Recommendations for Other Services       Precautions / Restrictions Precautions Precautions: Fall Restrictions Weight Bearing Restrictions: No      Mobility  Bed Mobility Overal bed mobility: Needs Assistance Bed Mobility: Supine to Sit;Sit to Supine     Supine to sit: Supervision;HOB elevated Sit to supine: Min guard;HOB elevated   General bed mobility comments: Supervision for safety, managing lines. Slow to move. Use of rail.  Transfers Overall transfer  level: Needs assistance Equipment used: None Transfers: Sit to/from Stand Sit to Stand: Min guard         General transfer comment: Min guard for safety. Stood from EOB x1, posterior lean initially, reaching for IV ;pole for support.  Ambulation/Gait Ambulation/Gait assistance: Min guard Gait Distance (Feet): 120 Feet Assistive device: IV Pole Gait Pattern/deviations: Step-through pattern;Decreased stride length;Wide base of support Gait velocity: decreased   General Gait Details: Slow, mildly unsteady gait with IV pole and HHA progressing to 1 UE support. 2/4 DOE. 1 standing rest break.  Stairs            Wheelchair Mobility    Modified Rankin (Stroke Patients Only) Modified Rankin (Stroke Patients Only) Pre-Morbid Rankin Score: Slight disability Modified Rankin: Moderately severe disability     Balance Overall balance assessment: Needs assistance Sitting-balance support: Feet supported;No upper extremity supported Sitting balance-Leahy Scale: Good Sitting balance - Comments: posterior lean with MMT of LEs Postural control: Posterior lean Standing balance support: No upper extremity supported;During functional activity Standing balance-Leahy Scale: Fair Standing balance comment: close min guard, does better with UE support for dynamic tasks/walking.                             Pertinent Vitals/Pain Pain Assessment: No/denies pain    Home Living Family/patient expects to be discharged to:: Private residence Living Arrangements: Alone Available Help at Discharge: Family;Available PRN/intermittently(son lives ~8 mins away) Type of Home: House Home Access: Level entry     Home Layout: One level Home Equipment: Grab bars - tub/shower;Kasandra Knudsen - single point      Prior Function  Level of Independence: Independent with assistive device(s)         Comments: uses SPC, driving, performing iADLs. Likes to [employer].     Hand Dominance    Dominant Hand: Right    Extremity/Trunk Assessment   Upper Extremity Assessment Upper Extremity Assessment: Defer to OT evaluation LUE Deficits / Details: weak grip strength and impaired coordination noted in hand LUE Sensation: WNL LUE Coordination: decreased fine motor    Lower Extremity Assessment Lower Extremity Assessment: Generalized weakness(but functional)    Cervical / Trunk Assessment Cervical / Trunk Assessment: Normal  Communication   Communication: HOH  Cognition Arousal/Alertness: Awake/alert Behavior During Therapy: WFL for tasks assessed/performed Overall Cognitive Status: Within Functional Limits for tasks assessed                                 General Comments: HOH so needs repetition at times.      General Comments General comments (skin integrity, edema, etc.): Not getting a good 02 reading/pleth, reports minor SOB, much improved.    Exercises     Assessment/Plan    PT Assessment Patient needs continued PT services  PT Problem List Decreased strength;Decreased mobility;Decreased balance;Decreased activity tolerance;Cardiopulmonary status limiting activity;Decreased coordination       PT Treatment Interventions Therapeutic activities;Gait training;Therapeutic exercise;Patient/family education;Balance training;Functional mobility training;Neuromuscular re-education    PT Goals (Current goals can be found in the Care Plan section)  Acute Rehab PT Goals Patient Stated Goal: return home, maintain independence  PT Goal Formulation: With patient Time For Goal Achievement: 09/10/19 Potential to Achieve Goals: Good    Frequency Min 4X/week   Barriers to discharge Decreased caregiver support lives alone    Co-evaluation               AM-PAC PT "6 Clicks" Mobility  Outcome Measure Help needed turning from your back to your side while in a flat bed without using bedrails?: A Little Help needed moving from lying on your back to  sitting on the side of a flat bed without using bedrails?: A Little Help needed moving to and from a bed to a chair (including a wheelchair)?: A Little Help needed standing up from a chair using your arms (e.g., wheelchair or bedside chair)?: A Little Help needed to walk in hospital room?: A Little Help needed climbing 3-5 steps with a railing? : A Little 6 Click Score: 18    End of Session Equipment Utilized During Treatment: Gait belt Activity Tolerance: Patient tolerated treatment well;Patient limited by fatigue Patient left: in bed;with call bell/phone within reach;with bed alarm set;with family/visitor present Nurse Communication: Mobility status PT Visit Diagnosis: Difficulty in walking, not elsewhere classified (R26.2);Muscle weakness (generalized) (M62.81)    Time: 1017-1040 PT Time Calculation (min) (ACUTE ONLY): 23 min   Charges:   PT Evaluation $PT Eval Moderate Complexity: 1 Mod PT Treatments $Gait Training: 8-22 mins        Marisa Severin, PT, DPT Acute Rehabilitation Services Pager [phone] Office [place] 08/27/2019, 10:47 AM

## 2019-08-27 NOTE — Progress Notes (Signed)
[place] for heparin Indication: 5/21 CVA and atrial fibrillation (apixaban on hold)  Allergies  Allergen Reactions  . Rivaroxaban Hives, Itching and Swelling    Hives, swelling, itching Hives, swelling, itching  . Lovastatin Other (See Comments)    muscle aches  . Pantoprazole Other (See Comments)    PPI likely caused blood in urine.    . Proton Pump Inhibitors Other (See Comments)    Blood in urine while on PPI PPI likely caused blood in urine.  Blood in urine while on PPI  . Zetia [Ezetimibe] Diarrhea    Plus blood in urine, nausea, fatigue, dizzyness (possibility this was due to bladder cancer and not the medication)    Patient Measurements: Height: 5\' 11"  (180.3 cm) Weight: 65.3 kg (144 lb) IBW/kg (Calculated) : 75.3 Heparin Dosing Weight: 65kg  Vital Signs: Temp: 97.6 F (36.4 C) (05/24 0758) Temp Source: Oral (05/24 0355) BP: 149/111 (05/24 0758) Pulse Rate: 87 (05/24 0758)  Labs: Recent Labs    08/25/19 0307 08/26/19 2058 08/27/19 0447  HGB 14.0  --  14.6  HCT 43.0  --  44.9  PLT 344  --  320  APTT  --  149* 58*  HEPARINUNFRC  --   --  0.41  CREATININE 1.58*  --   --     Estimated Creatinine Clearance: 31 mL/min (A) (by C-G formula based on SCr of 1.58 mg/dL (H)).   Assessment: 86 yo M on apixaban PTA for afib (CHADS2VASc = 6) now with small CVA. Apixaban held for procedures, last dose 5/22 8:50am. Now to heparin IV; Per MD, no bolus and low goal for CVA. H/H, plt stable.   PTT this AM 58 seconds  Goal of Therapy:  Heparin level 0.3 -0.5 units/ml  APTT 66-85 sec Monitor platelets by anticoagulation protocol: Yes   Plan:  Increase heparin to 700 units / hr Follow up after possible procedure today  Thank you Anette Guarneri, PharmD [phone]  Please check AMION for all [place] numbers  08/27/2019 8:38 AM

## 2019-08-27 NOTE — Significant Event (Signed)
Patient has a new diagnosis of lung cancer with metastasis Underwent left thoracentesis this morning, 1.7 L of serosanguineous fluid drained. Postprocedure, patient has been resumed on heparin drip that has been going on for last 24 hours while off Eliquis.  I had a conversation with patient and his son this morning.  Patient and son chose for hospice care at home.   Evaluated this morning by hospice nurse. Case being reviewed by hospice MD for eligibility.  Paged by RN at 12:12 PM for 10/10 chest pain I attended immediately.  RN had given a sublingual nitroglycerin before I arrived. Patient complaining of chest pain in the precordium.  Deep-seated but also tender to touch. Associated with chest tightness, not on supplemental oxygen. Son at bedside. Patient blood pressure is low in the 60s but he is able to open eyes on command and answer some questions.  Stat EKG shows significant ST elevation in lead V1 and V2 which is more pronounced compared to EKG on admission. I strongly believe he is having an acute ST elevation MI. Patient and son refuse aggressive care including cardiac cath and stenting. We discussed about maximal medical management we can offer. Patient is already on heparin drip which we will continue. Blood pressure is low in 60s after 1 dose of sublingual nitroglycerin given earlier.  But patient and his son want to focus on being pain-free and comfortable.  We will repeat 1 more dose of sublingual nitroglycerin. Normocytic bolus 250 ml for blood pressure support. Once blood pressure improves, we may able to offer morphine IV for pain control. DNR/DNI Continue to monitor in telemetry. Hopefully we can minimize the extent of his myocardial damage with medical management while also focusing on keeping him comfortable.

## 2019-08-27 NOTE — Care Management Important Message (Signed)
Important Message  Patient Details  Name: [name]. MRN: [id number] Date of Birth: October 01, 1931   Medicare Important Message Given:  Yes   CORRECTION .[name] KitchenMarland Kitchen Patients son refused to sign.   Tiarah Shisler 08/27/2019, 1:14 PM

## 2019-08-27 NOTE — TOC Transition Note (Signed)
Transition of Care (TOC) - CM/SW Discharge Note   Patient Details  Name: Kenneth E Fortenberry Jr. MRN: [id number] Date of Birth: 10/20/1931  Transition of Care (TOC) CM/SW Contact:  Kelli F Willard, RN Phone Number: 08/27/2019, 12:08 PM   Clinical Narrative:    CM consulted for home with hospice. CM met with the patient and his son and provided choice for hospice agencies. The son selected [facility]. Sherry with [facility] notified and will speak with son and update CM.  Son currently doesn't think they need any extra DME at the home. Pt has a walker and 3 in 1.  Pt will be discharging to sons home; [address] in [place] [address] Son: [name] [phone]  Son is hoping for d/c home today. TOC following.   Final next level of care: Home w Hospice Care Barriers to Discharge: No Barriers Identified   Patient Goals and CMS Choice   CMS Medicare.gov Compare Post Acute Care list provided to:: Patient Represenative (must comment) Choice offered to / list presented to : Adult Children  Discharge Placement                       Discharge Plan and Services                            HH Agency: [facility] Date HH Agency Contacted: 08/27/19   Representative spoke with at HH Agency: Sherry  Social Determinants of Health (SDOH) Interventions     Readmission Risk Interventions No flowsheet data found.     

## 2019-08-27 NOTE — Progress Notes (Signed)
STROKE TEAM PROGRESS NOTE   INTERVAL HISTORY His son is at the bedside.  Patient had thoracocentesis of 1.7 L serosanguineous fluid done earlier today.  He seems to be sitting comfortably in bed.   His son had questions about CT scan finding of small intraventricular mass and whether it could be contributing to his stroke symptoms which I doubt.  Discussed imaging findings at length with patient and son and answered questions.  Patient's left hand weakness is improving but still present Vitals:   08/27/19 0758 08/27/19 0800 08/27/19 0900 08/27/19 1000  BP: (!) 149/111 (!) 149/111    Pulse: 87 85 83 83  Resp: 18 (!) 27 (!) 32 (!) 25  Temp: 97.6 F (36.4 C)     TempSrc:      SpO2: 92% 92% 92% 95%  Weight:      Height:        CBC:  Recent Labs  Lab 08/24/19 0422 08/24/19 0431 08/25/19 0307 08/27/19 0447  WBC 12.7*   < > 12.8* 13.8*  NEUTROABS 9.0*  --   --   --   HGB 14.5   < > 14.0 14.6  HCT 44.9   < > 43.0 44.9  MCV 91.3   < > 89.6 90.5  PLT 381   < > 344 320   < > = values in this interval not displayed.    Basic Metabolic Panel:  Recent Labs  Lab 08/24/19 0422 08/24/19 0422 08/24/19 0431 08/25/19 0307  NA 137   < > 136 134*  K 4.3   < > 4.4 4.2  CL 100   < > 99 101  CO2 26  --   --  22  GLUCOSE 98   < > 89 102*  BUN 37*   < > 46* 34*  CREATININE 2.08*   < > 1.80* 1.58*  CALCIUM 9.7  --   --  9.5  PHOS  --   --   --  2.9   < > = values in this interval not displayed.   Lipid Panel:     Component Value Date/Time   CHOL 114 08/24/2019 1000   CHOL 206 (H) 07/11/2013 0829   TRIG 110 08/24/2019 1000   HDL 45 08/24/2019 1000   HDL 68 07/11/2013 0829   CHOLHDL 2.5 08/24/2019 1000   VLDL 22 08/24/2019 1000   LDLCALC 47 08/24/2019 1000   LDLCALC 88 07/11/2013 0829   HgbA1c:  Lab Results  Component Value Date   HGBA1C 6.1 (H) 08/24/2019   Urine Drug Screen:     Component Value Date/Time   LABOPIA NONE DETECTED 08/24/2019 1000   COCAINSCRNUR NONE DETECTED  08/24/2019 1000   LABBENZ NONE DETECTED 08/24/2019 1000   AMPHETMU NONE DETECTED 08/24/2019 1000   THCU NONE DETECTED 08/24/2019 1000   LABBARB NONE DETECTED 08/24/2019 1000    Alcohol Level     Component Value Date/Time   ETH <10 08/24/2019 0422    IMAGING past 24 hours DG Chest 1 View  Result Date: 08/27/2019 CLINICAL DATA:  Status post thoracentesis EXAM: CHEST  1 VIEW COMPARISON:  CT chest, 08/24/2019 FINDINGS: The heart size and mediastinal contours are within normal limits. Status post left thoracentesis, with near complete resolution of a small left pleural effusion. There is no significant pneumothorax. Extensive heterogeneous and nodular airspace opacity throughout the bilateral lungs, in keeping with findings of recent CT. The visualized skeletal structures are unremarkable. IMPRESSION: 1. Status post left thoracentesis, with near  complete resolution of a small left pleural effusion. There is no significant pneumothorax. 2. Extensive heterogeneous and nodular airspace opacitiy throughout the bilateral lungs, in keeping with findings of recent CT. Electronically Signed   By: Eddie Candle M.D.   On: 08/27/2019 10:04     PHYSICAL EXAM   General - Well nourished, well developed, in no apparent distress.  Ophthalmologic - fundi not visualized due to noncooperation.  Cardiovascular - irregularly irregular heart rate and rhythm.  Mental Status -  Level of arousal and orientation to time, place, and person were intact. Language including expression, naming, repetition, comprehension was assessed and found intact. Fund of Knowledge was assessed and was intact.  Cranial Nerves II - XII - II - Visual field intact OU. III, IV, VI - Extraocular movements intact. V - Facial sensation intact bilaterally. VII - Facial movement intact bilaterally. VIII - Hearing & vestibular intact bilaterally. X - Palate elevates symmetrically. XI - Chin turning & shoulder shrug intact  bilaterally. XII - Tongue protrusion intact.  Motor Strength - The patient's strength was symmetrical in all extremities.  No upper extremity drift but weakness of left grip and intrinsic hand muscles.  Diminished fine finger movements on the left.  Orbits right over left upper extremity.  Bulk was normal and fasciculations were absent.   Motor Tone - Muscle tone was assessed at the neck and appendages and was normal.  Reflexes - The patient's reflexes were symmetrical in all extremities and he had no pathological reflexes.  Sensory - Light touch, temperature/pinprick were assessed and were symmetrical.    Coordination - The patient had normal movements in the right hands with no ataxia or dysmetria, but left FTN mild dysmetria and slow action but still proportional to his weakness.  Tremor was absent.  Gait and Station - deferred.   ASSESSMENT/PLAN [name]. is a 86 y.o. male with history of atrial fibrillation on eliquis, bladder cancer s/p resection with urostomy, CAD s/p CABG in 1983, bilateral carotid artery stenosis, CKD stage III, HTN, HLD and PVD presenting with LUE weakness.   Stroke - possible small right MCA motor strip infarct - likely due to hypercoagulable state from advanced malignancy vs. afib even on eliquis. Do not think this is brain metastasis given rapid improvement.  Small well-defined right lateral ventricular mass is likely a papilloma versus metastasis and does not explain patient's symptoms  Code Stroke CT head No acute abnormality. Old R caudate and cerebellar infarcts. ASPECTS 10.     MRI  nondiagnostic DWI due to artifact with metallic objects.  Old R basal ganglia and cerebellar lacunes. Small vessel disease.   Repeat CT 08/25/19 - Stable head CT. No visible acute or evolving large vessel territory infarct. No other new intracranial abnormality.   TCD - lack of window  Carotid Doppler - unremarkable  2D Echo EF 60-65%  LDL 47  HgbA1c  6.1  Eliquis for VTE prophylaxis  aspirin 81 mg daily and Eliquis (apixaban) daily prior to admission, now on aspirin 81 mg daily and heparin IV in preparation of biopsy.   Therapy recommendations:  HH PT  Disposition:  pending   Possible lung cancer with mets Hx of bladder cancer  Chest CT - Innumerable subcentimeter noncalcified lung nodules scattered throughout both lungs, consistent with metastatic disease. 5.2 cm x 3.0 cm patchy area of masslike consolidation within the posteromedial aspect of the left upper lobe, which may represent a primary neoplasm.  Hx of bladder cancer  s/p resection and urostomy   Hx basal cell ca L cheek, R low back  Oncology on board  Plan for biopsy   Eliquis has change to heparin IV in preparation   Right ventricle mass  CT head 19mm mass R lateral ventricle.  MRI brain - R lateral ventricle w/ pedunculated mass.   Nonemergent contrast-enhanced brain MRI is recommended for further evaluation.  In the setting of possible lung cancer with mets, this could be brain mets but very atypical for location  Agree with neuro-onco involvement as outpt  Atrial Fibrillation  Home anticoagulation:  Eliquis (apixaban) daily continued in the hospital  Now on eliquis 2.5mg  bid  Eliquis changed to heparin IV for biopsy preparation . Further AC regimen after biopsy is yet to be determined.    ?? Carotid dz   R ICA 40-59%, L ICA 60-79% in 2014   1-39% bilateral in 09/2017   CUS 08/24/19 - 1-39% bilateral  Hypertension  Stable . BP goal normotensive  Hyperlipidemia  Home meds:  crestor 5, resumed in hospital  LDL 47, goal < 70  Continue statin at discharge  Other Stroke Risk Factors  Advanced age  Former Cigarette smoker, quit 42 yrs ago  Former smokeless tobacco user  ETOH use, alcohol level <10, advised to drink no more than 2 drink(s) a day (curret 21 drinks/week)  Coronary artery disease s/p CABG 1983  PVD  Other Active  Problems CKD - stage 4 - creatinine - 2.08->1.80->1.58 Aortic Atherosclerosis (ICD10-I70.0).  Leukocytosis - 12.7->12.8 (afebrile)  Severe protein calorie malnutrition -> nutrition consult planned  Hospital day # 3 Presented with left hand weakness likely due to small right frontal cortical infarct not visualized on CT scan x2.  Unable to give contrast due to poor renal failure.  MRI was nondiagnostic due to metallic artifacts.  Patient may need anticoagulation for his A. fib but he is being considered for hospice care at home as per family hence will leave decision up to primary team.  May consider switching Eliquis to warfarin as it was not effective given his underlying cancer.  The finding of small right lateral intraventricular mass likely represents papilloma versus metastasis and unlikely to explain clinical presentation.  Discussed with Dr. Pietro Cassis.  Greater than 50% time during this 25-minute visit was spent on counseling and coordination of care and discussion with patient, son and care team and answering questions.  Stroke team will sign off.  Kindly call for questions. Antony Contras, MD To contact Stroke Continuity provider, please refer to [email or web address] After hours, contact General Neurology

## 2019-08-27 NOTE — Procedures (Signed)
Ultrasound-guided diagnostic and therapeutic left sided thoracentesis performed yielding 1.7 liters of serosangius colored fluid. No immediate complications.   Diagnostic fluid was sent to the lab for further analysis. Follow-up chest x-ray pending. EBL is < 2 ml.

## 2019-08-27 NOTE — Progress Notes (Signed)
Events noted. Pt desiring to go to hospice. Also acute events of today noted. Given events from our stand-point would follow up on pleural fluid but even if a malignancy given pt's desires would make little sense to pursue this further.   We will sign off  Erick Colace ACNP-BC [place] Pager # [phone] OR # [phone] if no answer

## 2019-08-27 NOTE — Progress Notes (Signed)
Pt c/o 10/10 chest pain. STAT ECG and Nitro SL ordered and given. MD paged to assess at bedside. ECG shows acute MI. Family at bedside and discussed with MD their wishes of not wanting any invasive procedures and desires to medically manage with main goal of pt comfort. Md ordered 277ml bolus and 1mg  of morphine. Pt given morphine and reports relief of CP at this time. Family remains at bedside. Will continue to monitor.

## 2019-08-27 NOTE — Care Management Important Message (Signed)
Important Message  Patient Details  Name: [name]. MRN: [id number] Date of Birth: [birth year]   Medicare Important Message Given:  Yes     Tayen Narang 08/27/2019, 1:13 PM

## 2019-08-27 NOTE — Progress Notes (Signed)
PROGRESS NOTE  [name].  DOB: November 01, 1931  PCP: Tonia Ghent, MD YWV:[id number]  DOA: 08/24/2019  LOS: 3 days   Chief Complaint  Patient presents with  . Code Stroke   Brief narrative: [name]. is an 86 y.o. male with a PMHx of atrial fibrillation  , bladder cancer s/p resection with urostomy, CAD s/p CABG in 1983, bilateral carotid artery stenosis, CKD stage III, HTN, HLD and PVD. Patient presented to the ED on 5/21 from home with new onset of LUE weakness that he noticed on awakening at 0300, after going to bed at 1900 while still at his baseline.  EMS noted some LUE drift and left hand weakness.  Of note, he is on Eliquis for his atrial fibrillation. He has had chronic dysphagia for a couple of weeks, had an EGD on 5/11; he had esophageal dilation with some improvement.  In the ED, he was hemodynamically stable.  Admitted for stroke work-up. Neurology consult obtained.  CT scan of head did not show any acute infarct but showed remote appearing infarct in the right caudate and cerebellum.  He was also found to have 11 mm mass at the right lateral ventricle. MRI confirmed a 12 mm pedunculated mass within the body of the right lateral ventricle. CT scan of chest, abdomen and pelvis with contrast was obtained. It showed 1. innumerable subcentimeter noncalcified lung nodules scattered throughout both lungs, consistent with metastatic disease. 2. 5.2 cm x 3.0 cm patchy area of masslike consolidation within the posteromedial aspect of the left upper lobe, which may represent a primary neoplasm. 3. Small to moderate sized patchy areas of right upper lobe, posterior right middle lobe and superior right lower lobe atelectasis and/or infiltrate. 4. Moderate size left pleural effusion, and small right pleural effusion.  He was admitted for acute stroke. Imagings also identified left lung cancer with metastasis. Hospital course also complicated by acute  MI.  Subjective: Patient was seen and examined this morning and again this afternoon. See the event note charted separately for details.  Assessment/Plan: Acute stroke -Presented with acute onset L hand weakness.  Continues to have weakness on handgrip. -CT scan head and MRI brain findings as above did not show any acute infarct but showed remote appearing infarct in the right carotid and cerebellum. -Neurology consult appreciated.  He probably has a small right MCA moderate restrictive infarct despite being on Eliquis. -Lipid panel with HDL 45, LDL 47.  A1c 6.1.   -TSH low at 0.031.  Pending free T4 -Echocardiogram with EF 60 to 65%, indeterminate diastolic function. -Bilateral carotid artery Doppler with no significant stenosis. -Normal transcranial Doppler report. -PT/OT/ST eval obtained.  -Continue home medicines that include Eliquis, aspirin, statin.  Possible lung cancer with brain mets -Imaging findings as above showing left upper lobe mass with moderate-sized left pleural effusion, bilateral pulmonary nodules as well as a 12 mm pedunculated mass in the right lateral ventricular brain. -Oncology consult obtained. -Patient underwent left thoracentesis today.  1.7 L of serosanguineous fluid drained.  Sent for cytology. -Patient and family refuses any aggressive care.  Home hospice planned.  Acute ST segment elevation MI -Sudden onset 10 out of 10 chest pain this morning. -See event note charted separately earlier for details -Patient and family refused any aggressive care. -Currently on medical management with IV heparin drip, sublingual nitroglycerin, Nitropaste, aspirin, statin, Eliquis. Focus is to keep the patient comfortable.  Patient and his son at bedside understand the acute on  chronic complication from MI which may significantly limit his prognosis/longevity which is already compromised because of lung cancer.  Cardiovascular issues : HTN, HLD, CAD sp CABG in 1983, PAD,  A. fib -Home meds include Toprol 25 mg daily along with Eliquis, aspirin and Crestor.   -On heparin drip replacing Eliquis -Continue other cardiac meds. -Continue to monitor in telemetry. Continue to monitor blood pressure  CKD stage IV -Appears to have progressed to stage 4 CKD and is stable at this time  Bladder cancer -Ileostomy in place, otherwise no current issues  Severe protein calorie malnutrition -Nutrition consult obtained.  Dietary supplements ordered.  Mobility: PT OT eval Code Status:  DNR per chart  DVT prophylaxis:  Heparin drip Antimicrobials:  None Fluid: None Diet: Cardiac diet  Consultants: Neurology Family Communication:  None at bedside  Status is: Inpatient  Remains inpatient appropriate because:Hemodynamically unstable, IV treatments appropriate due to intensity of illness or inability to take PO and Inpatient level of care appropriate due to severity of illness   Dispo: The patient is from: Home              Anticipated d/c is to: Home with hospice              Anticipated d/c date is: 1 day              Patient currently is not medically stable to d/c.  Will continue heparin drip for next 24 hours with an intention to minimize the degree of myocardial damage.  Also focus on comfort care.  If patient stops having symptoms tomorrow, we may be able to send him home with home hospice.   Antimicrobials: Anti-infectives (From admission, onward)   None        Code Status: DNR   Diet Order            Diet Heart Room service appropriate? Yes; Fluid consistency: Thin  Diet effective now              Infusions:  . heparin 700 Units/hr (08/27/19 1040)    Scheduled Meds: .  stroke: mapping our early stages of recovery book   Does not apply Once  . allopurinol  100 mg Oral QODAY  . aspirin EC  81 mg Oral Daily  . famotidine  40 mg Oral BID  . feeding supplement (ENSURE ENLIVE)  237 mL Oral TID BM  . ferrous sulfate  325 mg Oral QODAY  .  metoprolol succinate  25 mg Oral Daily  . multivitamin with minerals  1 tablet Oral Daily  . nitroGLYCERIN  0.5 inch Topical Q6H  . rosuvastatin  5 mg Oral Daily    PRN meds: acetaminophen **OR** acetaminophen (TYLENOL) oral liquid 160 mg/5 mL **OR** acetaminophen, lidocaine, morphine injection, nitroGLYCERIN, polyethylene glycol   Objective: Vitals:   08/27/19 1000 08/27/19 1149  BP:    Pulse: 83   Resp: (!) 25   Temp:  98 F (36.7 C)  SpO2: 95%     Intake/Output Summary (Last 24 hours) at 08/27/2019 1451 Last data filed at 08/27/2019 1040 Gross per 24 hour  Intake 240 ml  Output 450 ml  Net -210 ml   Filed Weights   08/24/19 0436  Weight: 65.3 kg   Weight change:  Body mass index is 20.08 kg/m.   Physical Exam: General exam: In physical distress.  Of chest pain Skin: No rashes, lesions or ulcers. HEENT: Atraumatic, normocephalic, supple neck, no obvious bleeding Lungs: Clear to  auscultation bilaterally CVS: Regular rate and rhythm, no murmur GI/Abd soft, nontender, nondistended, bowel sound present CNS: Alert, half awake, opens eyes to verbal command Psychiatry: Depressed affect Extremities: No pedal edema, no calf tenderness  Data Review: I have personally reviewed the laboratory data and studies available.  Recent Labs  Lab 08/24/19 0422 08/24/19 0431 08/25/19 0307 08/27/19 0447  WBC 12.7*  --  12.8* 13.8*  NEUTROABS 9.0*  --   --   --   HGB 14.5 15.6 14.0 14.6  HCT 44.9 46.0 43.0 44.9  MCV 91.3  --  89.6 90.5  PLT 381  --  344 320   Recent Labs  Lab 08/24/19 0422 08/24/19 0431 08/25/19 0307  NA 137 136 134*  K 4.3 4.4 4.2  CL 100 99 101  CO2 26  --  22  GLUCOSE 98 89 102*  BUN 37* 46* 34*  CREATININE 2.08* 1.80* 1.58*  CALCIUM 9.7  --  9.5  PHOS  --   --  2.9    Signed, Terrilee Croak, MD [facility] Pager: [phone] (Secure Chat preferred). 08/27/2019

## 2019-08-28 ENCOUNTER — Telehealth: Payer: Self-pay

## 2019-08-28 DIAGNOSIS — Z515 Encounter for palliative care: Secondary | ICD-10-CM

## 2019-08-28 LAB — APTT: aPTT: 72 seconds — ABNORMAL HIGH (ref 24–36)

## 2019-08-28 LAB — CBC
HCT: 44.9 % (ref 39.0–52.0)
Hemoglobin: 14.3 g/dL (ref 13.0–17.0)
MCH: 29.1 pg (ref 26.0–34.0)
MCHC: 31.8 g/dL (ref 30.0–36.0)
MCV: 91.4 fL (ref 80.0–100.0)
Platelets: 320 10*3/uL (ref 150–400)
RBC: 4.91 MIL/uL (ref 4.22–5.81)
RDW: 12.9 % (ref 11.5–15.5)
WBC: 13.7 10*3/uL — ABNORMAL HIGH (ref 4.0–10.5)
nRBC: 0 % (ref 0.0–0.2)

## 2019-08-28 LAB — HEPARIN LEVEL (UNFRACTIONATED): Heparin Unfractionated: 0.32 IU/mL (ref 0.30–0.70)

## 2019-08-28 MED ORDER — ACETAMINOPHEN 325 MG PO TABS: 650.0000 mg | ORAL_TABLET | ORAL | Status: AC | PRN

## 2019-08-28 MED ORDER — NITROGLYCERIN 2 % TD OINT: 0.5000 [in_us] | TOPICAL_OINTMENT | Freq: Four times a day (QID) | TRANSDERMAL | 0 refills | Status: AC

## 2019-08-28 MED ORDER — ENSURE ENLIVE PO LIQD: 237.0000 mL | Freq: Four times a day (QID) | ORAL | 12 refills | Status: AC

## 2019-08-28 MED ORDER — ENSURE ENLIVE PO LIQD: 237.0000 mL | Freq: Three times a day (TID) | ORAL | 12 refills | Status: AC

## 2019-08-28 MED ORDER — ENSURE ENLIVE PO LIQD
237.0000 mL | Freq: Four times a day (QID) | ORAL | Status: DC
  Administered 2019-08-28 – 2019-08-30 (×8): 237 mL via ORAL

## 2019-08-28 MED ORDER — MORPHINE SULFATE 15 MG PO TABS: 15.0000 mg | ORAL_TABLET | ORAL | 0 refills | Status: AC | PRN

## 2019-08-28 NOTE — Progress Notes (Signed)
Nutrition Follow-up  DOCUMENTATION CODES:   Severe malnutrition in context of chronic illness  INTERVENTION:  Ensure Enlive po QID, each supplement provides 350 kcal and 20 grams of protein  Continue MVI daily  D/c Magic cup TID with meals, each supplement provides 290 kcal and 9 grams of protein  NUTRITION DIAGNOSIS:   Severe Malnutrition related to chronic illness as evidenced by percent weight loss, energy intake < or equal to 75% for > or equal to 1 month, severe muscle depletion, severe fat depletion.  Ongoing.  GOAL:   Patient will meet greater than or equal to 90% of their needs  Progressing.  MONITOR:   PO intake, Supplement acceptance, Labs, Weight trends, I & O's  REASON FOR ASSESSMENT:   Consult Other (Comment)(stroke)  ASSESSMENT:   Pt with a PMH significant for bladder ca, CAD s/p CABG, carotid stenosis, CKD stage 3, Afib, HTN, HLD, and PVD admitted for stroke. Pt noted to have had an EGD on 5/11 and chronic dysphagia for a couple of weeks.  Pt found to have lung mass with possible brain metastasis.   5/24 IR thoracentesis yielding 1.7 L  Pt reports continued poor appetite, but states he is drinking his Ensures (receiving TID currently) and really enjoys them. Pt dislikes Magic Cup. RD will increase number of Ensures to compensate for poor po intake.   PO Intake: 25-50% x 2 recorded meals  UOP: 891ml x24 hours I/O: -2,407.76ml since admit  Labs reviewed. Medications reviewed and include: Pepcid, Ferrous sulfate, MVI  Diet Order:   Diet Order            Diet Heart Room service appropriate? Yes; Fluid consistency: Thin  Diet effective now              EDUCATION NEEDS:   No education needs have been identified at this time  Skin:  Skin Assessment: Reviewed RN Assessment  Last BM:  5/24  Height:   Ht Readings from Last 1 Encounters:  08/24/19 5\' 11"  (1.803 m)    Weight:   Wt Readings from Last 1 Encounters:  08/24/19 65.3 kg     BMI:  Body mass index is 20.08 kg/m.  Estimated Nutritional Needs:   Kcal:  2000-2200  Protein:  100-115 grams  Fluid:  >2L/d    Larkin Ina, MS, RD, LDN RD pager number and weekend/on-call pager number located in [place].

## 2019-08-28 NOTE — Discharge Summary (Signed)
Physician Discharge Summary  [name]. MVH:[id number] DOB: 04-07-1931 DOA: 08/24/2019  PCP: Tonia Ghent, MD  Admit date: 08/24/2019 Discharge date: 08/28/2019  Admitted From: Home Discharge disposition: Inpatient hospice    Code Status: DNR  Diet Recommendation: Cardiac diet   Recommendations for Outpatient Follow-Up:   1. Per hospice protocol  Discharge Diagnosis:   Principal Problem:   Left hand weakness Active Problems:   HYPERTENSION, BENIGN ESSENTIAL   Bladder cancer (HCC)   CKD (chronic kidney disease), stage III   Chronic atrial fibrillation (HCC)   PVD (peripheral vascular disease) (HCC)   Hyperlipidemia   Protein-calorie malnutrition, severe    History of Present Illness / Brief narrative:  Kenneth Jacksonis an 86 y.o.malewith a PMHx of atrial fibrillation  , bladder cancer s/p resection with urostomy, CAD s/p CABG in 86, bilateral carotid artery stenosis, CKD stage III, HTN, HLD and PVD. Patient presented to the ED on 5/21 from home with new onset of LUE weakness that he noticed on awakening at 0300, after going to bed at 1900 while still at his baseline.  EMS noted some LUE drift and left hand weakness.  Of note, he is on Eliquis for his atrial fibrillation.He has had chronic dysphagia for a couple of weeks, had an EGD on 5/11; he had esophageal dilation with some improvement.  In the ED, he was hemodynamically stable.  Admitted for stroke work-up. Neurology consult obtained.  CT scan of head did not show any acute infarct but showed remote appearing infarct in the right caudate and cerebellum.  He was also found to have 11 mm mass at the right lateral ventricle. MRI confirmed a 12 mm pedunculated mass within the body of the right lateral ventricle. CT scan of chest, abdomen and pelvis with contrast was obtained. It showed 1. innumerable subcentimeter noncalcified lung nodules scattered throughout both lungs, consistent with metastatic  disease. 2. 5.2 cm x 3.0 cm patchy area of masslike consolidation within the posteromedial aspect of the left upper lobe, which may represent a primary neoplasm. 3. Small to moderate sized patchy areas of right upper lobe, posterior right middle lobe and superior right lower lobe atelectasis and/or infiltrate. 4. Moderate size left pleural effusion, and small right pleural effusion.  He was admitted for acute stroke. Imagings also identified left lung cancer with metastasis. Hospital course also complicated by acute MI. I had long conversation with patient and his son.  Both are in agreement about patient's already frail state of health prior to presentation.  His health has certainly gotten worse with the diagnosis of new stroke, lung cancer with mets as well as an MI. Patient does not want any aggressive care.  He wants to continue his regular meds and stay comfortable.  Hospice option was discussed.  Patient and son are agreeable to discharge with hospice.  Hospital Course:  Acute stroke -Presented with acute onset L hand weakness.  Continues to have weakness on handgrip. -CT scan head and MRI brain findings as above did not show any acute infarct but showed remote appearing infarct in the right carotid and cerebellum. -Neurology consult appreciated.  He probably has a small right MCA moderate restrictive infarct despite being on Eliquis. -Lipid panel with HDL 45, LDL 47.  A1c 6.1.   -TSH low at 0.031.  Pending free T4 -Echocardiogram with EF 60 to 65%, indeterminate diastolic function. -Bilateral carotid artery Doppler with no significant stenosis. -Normal transcranial Doppler report. -PT/OT/ST eval obtained.  -Continue  home medicines that include Eliquis, aspirin, statin.  Possible lung cancer with brain mets -Imaging findings as above showing left upper lobe mass with moderate-sized left pleural effusion, bilateral pulmonary nodules as well as a 12 mm pedunculated mass in the right  lateral ventricular brain. -Oncology consult obtained. -Patient underwent left thoracentesis today.  1.7 L of serosanguineous fluid drained.  Sent for cytology. -Patient and family refuses any aggressive care.  Hospice at discharge  Acute ST segment elevation MI -on 5/24, Sudden onset 10 out of 10 chest pain. -EKG showed significant ST elevation in lead V1 and V2 which was more pronounced compared to  EKG on admission. -Troponin elevated to 120.  -Patient and family did not want aggressive care like cardiac cath and stenting. -Patient was maintained on heparin drip for 24 hours.  Symptomatic management with nitroglycerin tablet and morphine was given. -This morning, patient is pain-free, feels better. -We will stop heparin drip at discharge. -Continue Toprol, aspirin, Crestor, Eliquis and nitroglycerin tablets post discharge.  Cardiovascular issues : HTN, HLD, CAD sp CABG in 1983, PAD, A. fib -Home meds include Toprol 25 mg daily along with Eliquis, aspirin and Crestor.   -Resumed all cardiac meds.  CKD stage IV -Stable creatinine  Bladder cancer -Ileostomy in place, otherwise no current issues  Severe protein calorie malnutrition -Nutrition consult obtained.  Dietary supplements ordered.  Okay to discharge to hospice facility   Subjective:  Seen and examined this morning.  Pleasant elderly Caucasian male.  Cheerful, chest pain-free.  Feels much better this morning.  Discharge Exam:   Vitals:   08/28/19 0358 08/28/19 0800 08/28/19 0930 08/28/19 1203  BP: 119/76  125/84   Pulse: 72  81   Resp: (!) 25     Temp: 98.3 F (36.8 C) 98 F (36.7 C)  98.4 F (36.9 C)  TempSrc: Axillary     SpO2: 93%     Weight:      Height:        Body mass index is 20.08 kg/m.  General exam: Appears calm and comfortable.  Skin: No rashes, lesions or ulcers. HEENT: Atraumatic, normocephalic, supple neck, no obvious bleeding Lungs: Clear to auscultation bilaterally CVS: Regular  rate and rhythm, no murmur GI/Abd soft, nontender, nondistended, bowel sound present CNS: Alert, awake, oriented x3 Psychiatry: Cheerful Extremities: No pedal edema, no calf tenderness  Discharge Instructions:  Wound care: None.  Continue ileostomy care as before. Discharge Instructions    Increase activity slowly   Complete by: As directed       Allergies as of 08/28/2019      Reactions   Rivaroxaban Hives, Itching, Swelling   Hives, swelling, itching Hives, swelling, itching   Lovastatin Other (See Comments)   muscle aches   Pantoprazole Other (See Comments)   PPI likely caused blood in urine.     Proton Pump Inhibitors Other (See Comments)   Blood in urine while on PPI PPI likely caused blood in urine.  Blood in urine while on PPI   Zetia [ezetimibe] Diarrhea   Plus blood in urine, nausea, fatigue, dizzyness (possibility this was due to bladder cancer and not the medication)      Medication List    TAKE these medications   acetaminophen 325 MG tablet Commonly known as: TYLENOL Take 2 tablets (650 mg total) by mouth every 4 (four) hours as needed for mild pain (or temp > 37.5 C (99.5 F)).   allopurinol 100 MG tablet Commonly known as: ZYLOPRIM TAKE 1  TABLET BY MOUTH EVERY OTHER DAY   apixaban 2.5 MG Tabs tablet Commonly known as: ELIQUIS Take 1 tablet (2.5 mg total) by mouth 2 (two) times daily.   aspirin EC 81 MG tablet Take 1 tablet (81 mg total) by mouth daily.   cholecalciferol 25 MCG (1000 UNIT) tablet Commonly known as: VITAMIN D Take 1,000 Units by mouth daily.   famotidine 40 MG tablet Commonly known as: PEPCID Take 1 tablet (40 mg total) by mouth 2 (two) times daily.   feeding supplement (ENSURE ENLIVE) Liqd Take 237 mLs by mouth 3 (three) times daily between meals.   feeding supplement (ENSURE ENLIVE) Liqd Take 237 mLs by mouth 4 (four) times daily.   ferrous sulfate 325 (65 FE) MG tablet TAKE 1 TABLET BY MOUTH EVERY OTHER DAY WITH  BREAKFAST What changed:   how much to take  how to take this  when to take this  additional instructions   fexofenadine 180 MG tablet Commonly known as: Allegra Allergy Take 1 tablet (180 mg total) by mouth daily as needed for allergies.   fluticasone 50 MCG/ACT nasal spray Commonly known as: FLONASE Place 2 sprays into both nostrils daily as needed for allergies.   metoprolol succinate 25 MG 24 hr tablet Commonly known as: TOPROL-XL TAKE 1 TABLET (25 MG TOTAL) BY MOUTH DAILY. TAKE WITH OR IMMEDIATELY FOLLOWING A MEAL.   morphine 15 MG tablet Commonly known as: MSIR Take 1 tablet (15 mg total) by mouth every 4 (four) hours as needed for up to 3 days for severe pain.   nitroGLYCERIN 0.4 MG SL tablet Commonly known as: Nitrostat Place 1 tablet (0.4 mg total) under the tongue every 5 (five) minutes as needed for chest pain. What changed: Another medication with the same name was added. Make sure you understand how and when to take each.   nitroGLYCERIN 2 % ointment Commonly known as: NITROGLYN Apply 0.5 inches topically every 6 (six) hours. What changed: You were already taking a medication with the same name, and this prescription was added. Make sure you understand how and when to take each.   polyethylene glycol 17 g packet Commonly known as: MIRALAX / GLYCOLAX Take 17 g by mouth daily as needed for mild constipation.   rosuvastatin 5 MG tablet Commonly known as: CRESTOR TAKE ONE OR TWO TABLETS DAILY AT 6PM (ALTERNATING EACH DAY) What changed: See the new instructions.   STOOL SOFTENER PO Take 1 capsule by mouth daily as needed (Constipation).   triamcinolone cream 0.1 % Commonly known as: KENALOG Apply 1 application topically daily as needed (itching).       Time coordinating discharge: 35 minutes  The results of significant diagnostics from this hospitalization (including imaging, microbiology, ancillary and laboratory) are listed below for reference.     Procedures and Diagnostic Studies:   CT ABDOMEN PELVIS WO CONTRAST  Result Date: 08/24/2019 CLINICAL DATA:  Urologic cancer, surveillance. EXAM: CT CHEST, ABDOMEN AND PELVIS WITHOUT CONTRAST TECHNIQUE: Multidetector CT imaging of the chest, abdomen and pelvis was performed following the standard protocol without IV contrast. COMPARISON:  None. FINDINGS: CT CHEST FINDINGS Cardiovascular: There is marked severity calcification of the aortic arch. Normal heart size. No pericardial effusion. Marked severity coronary artery calcification is seen. Mediastinum/Nodes: No enlarged mediastinal, hilar, or axillary lymph nodes. Thyroid gland, trachea, and esophagus demonstrate no significant findings. Lungs/Pleura: Innumerable subcentimeter noncalcified lung nodules are seen scattered throughout both lungs. A 5.2 cm x 3.0 cm patchy area of masslike consolidation is  seen within the posteromedial aspect of the left upper lobe. Small to moderate sized patchy areas of atelectasis and/or infiltrate are seen within the right upper lobe posteromedial right middle lobe and superior aspect of the right lower lobe. Mild atelectasis is seen along the posterior aspect of the left lung base. There is a moderate size left pleural effusion. A small right pleural effusion is also seen. No pneumothorax is identified. Musculoskeletal: Multiple sternal wires are seen. Degenerative changes seen within the thoracic spine. CT ABDOMEN PELVIS FINDINGS Hepatobiliary: No focal liver abnormality is seen. Multiple subcentimeter gallstones are seen within the lumen of an otherwise normal-appearing gallbladder. There is no evidence of gallbladder wall thickening or biliary dilatation. Pancreas: Unremarkable. No pancreatic ductal dilatation or surrounding inflammatory changes. Spleen: Normal in size without focal abnormality. Adrenals/Urinary Tract: Adrenal glands are unremarkable. The left kidney is atrophic in appearance. A 1.3 cm cyst is seen  within the lateral aspect of the mid left kidney. There is marked severity left-sided hydronephrosis and hydroureter with prominence of the intrarenal calices. The right kidney is normal in size, without renal calculi, focal lesion, or hydronephrosis. The urinary bladder is absent. Stomach/Bowel: There is a moderate-sized hiatal hernia. The appendix is not clearly identified. No evidence of bowel dilatation. Noninflamed diverticula are seen throughout the sigmoid colon. Vascular/Lymphatic: Marked severity aortic calcification. No enlarged abdominal or pelvic lymph nodes. Reproductive: The prostate gland is absent. Other: A right lower quadrant ostomy site is noted. No abdominopelvic ascites. Musculoskeletal: Moderate to marked severity multilevel degenerative changes seen throughout the lumbar spine. IMPRESSION: 1. Innumerable subcentimeter noncalcified lung nodules scattered throughout both lungs, consistent with metastatic disease. 2. 5.2 cm x 3.0 cm patchy area of masslike consolidation within the posteromedial aspect of the left upper lobe, which may represent a primary neoplasm. 3. Small to moderate sized patchy areas of right upper lobe, posterior right middle lobe and superior right lower lobe atelectasis and/or infiltrate. 4. Moderate size left pleural effusion, and small right pleural effusion. 5. Cholelithiasis without evidence of cholecystitis. 6. Moderate-sized hiatal hernia. 7. Sigmoid diverticulosis. Aortic Atherosclerosis (ICD10-I70.0). Electronically Signed   By: Virgina Norfolk M.D.   On: 08/24/2019 21:27   CT HEAD WO CONTRAST  Result Date: 08/25/2019 CLINICAL DATA:  Follow-up examination for acute stroke. EXAM: CT HEAD WITHOUT CONTRAST TECHNIQUE: Contiguous axial images were obtained from the base of the skull through the vertex without intravenous contrast. COMPARISON:  Prior CT and MRI from 08/24/2019. FINDINGS: Brain: Moderately advanced cerebral atrophy with chronic small vessel ischemic  disease. Few scatter remote lacunar infarcts noted at the right basal ganglia and cerebellum. No acute intracranial hemorrhage. No visible acute or evolving large vessel territory infarct. Small intraventricular mass within the right lateral ventricle again noted, stable. No hydrocephalus or ventricular trapping. No extra-axial fluid collection. Vascular: No hyperdense vessel. Calcified atherosclerosis present at skull base. Skull: Scalp soft tissues and calvarium within normal limits. Sinuses/Orbits: Globes and orbital soft tissues demonstrate no acute finding. Paranasal sinuses and mastoid air cells remain clear. Other: None. IMPRESSION: Stable head CT. No visible acute or evolving large vessel territory infarct. No other new intracranial abnormality. Electronically Signed   By: Jeannine Boga M.D.   On: 08/25/2019 06:30   CT CHEST WO CONTRAST  Result Date: 08/24/2019 CLINICAL DATA:  Left upper extremity weakness. EXAM: CT CHEST, ABDOMEN AND PELVIS WITHOUT CONTRAST TECHNIQUE: Multidetector CT imaging of the chest, abdomen and pelvis was performed following the standard protocol without IV contrast. COMPARISON:  None. FINDINGS:  CT CHEST FINDINGS Cardiovascular: There is marked severity calcification of the aortic arch. Normal heart size. No pericardial effusion. Marked severity coronary artery calcification is seen. Mediastinum/Nodes: No enlarged mediastinal, hilar, or axillary lymph nodes. Thyroid gland, trachea, and esophagus demonstrate no significant findings. Lungs/Pleura: Innumerable subcentimeter noncalcified lung nodules are seen scattered throughout both lungs. A 5.2 cm x 3.0 cm patchy area of masslike consolidation is seen within the posteromedial aspect of the left upper lobe. Small to moderate sized patchy areas of atelectasis and/or infiltrate are seen within the right upper lobe posteromedial right middle lobe and superior aspect of the right lower lobe. Mild atelectasis is seen along the  posterior aspect of the left lung base. There is a moderate size left pleural effusion. A small right pleural effusion is also seen. No pneumothorax is identified. Musculoskeletal: Multiple sternal wires are seen. Degenerative changes seen within the thoracic spine. CT ABDOMEN PELVIS FINDINGS Hepatobiliary: No focal liver abnormality is seen. Multiple subcentimeter gallstones are seen within the lumen of an otherwise normal-appearing gallbladder. There is no evidence of gallbladder wall thickening or biliary dilatation. Pancreas: Unremarkable. No pancreatic ductal dilatation or surrounding inflammatory changes. Spleen: Normal in size without focal abnormality. Adrenals/Urinary Tract: Adrenal glands are unremarkable. The left kidney is atrophic in appearance. A 1.3 cm cyst is seen within the lateral aspect of the mid left kidney. There is marked severity left-sided hydronephrosis and hydroureter with prominence of the intrarenal calices. The right kidney is normal in size, without renal calculi, focal lesion, or hydronephrosis. The urinary bladder is absent. Stomach/Bowel: There is a moderate-sized hiatal hernia. The appendix is not clearly identified. No evidence of bowel dilatation. Noninflamed diverticula are seen throughout the sigmoid colon. Vascular/Lymphatic: Marked severity aortic calcification. No enlarged abdominal or pelvic lymph nodes. Reproductive: The prostate gland is absent. Other: A right lower quadrant ostomy site is noted. No abdominopelvic ascites. Musculoskeletal: Moderate to marked severity multilevel degenerative changes seen throughout the lumbar spine. IMPRESSION: 1. Innumerable subcentimeter noncalcified lung nodules scattered throughout both lungs, consistent with metastatic disease. 2. 5.2 cm x 3.0 cm patchy area of masslike consolidation within the posteromedial aspect of the left upper lobe, which may represent a primary neoplasm. 3. Small to moderate sized patchy areas of right upper  lobe, posterior right middle lobe and superior right lower lobe atelectasis and/or infiltrate. 4. Moderate size left pleural effusion, and small right pleural effusion. 5. Cholelithiasis without evidence of cholecystitis. 6. Moderate-sized hiatal hernia. 7. Sigmoid diverticulosis. Aortic Atherosclerosis (ICD10-I70.0). Electronically Signed   By: Virgina Norfolk M.D.   On: 08/24/2019 21:25   CT CERVICAL SPINE WO CONTRAST  Addendum Date: 08/24/2019   ADDENDUM REPORT: 08/24/2019 19:17 ADDENDUM: Findings of innumerable nodules within the imaged lung apices and partially imaged left pleural effusion, and recommendation for additional dedicated CT imaging of the chest, called by telephone at the time of interpretation on 08/24/2019 at 7:16 pm to provider Karmen Bongo , who verbally acknowledged these results. Electronically Signed   By: Kellie Simmering DO   On: 08/24/2019 19:17   Result Date: 08/24/2019 CLINICAL DATA:  Neuro deficit, acute, stroke suspected. Additional provided: Left upper extremity weakness. EXAM: CT CERVICAL SPINE WITHOUT CONTRAST TECHNIQUE: Multidetector CT imaging of the cervical spine was performed without intravenous contrast. Multiplanar CT image reconstructions were also generated. COMPARISON:  No pertinent prior studies available for comparison. FINDINGS: Alignment: Trace C2-C3 grade retrolisthesis. 2 mm C4-C5 grade 1 anterolisthesis. Trace C5-C6 retrolisthesis. Skull base and vertebrae: The basion-dental and atlanto-dental intervals  are maintained.No evidence of acute fracture to the cervical spine. Soft tissues and spinal canal: No prevertebral fluid or swelling. Disc levels: Multilevel disc space narrowing. Most notably, there is moderate/advanced disc space narrowing at C5-C6. Degenerative fusion across the C6-C7 disc space. Pannus formation posterior to the dens without significant appreciable spinal canal narrowing. C2-C3: No significant disc herniation. Facet arthrosis. Degenerative  fusion across the right facet joint. No appreciable significant spinal canal stenosis or neural foraminal narrowing. C3-C4: Small disc bulge. Uncovertebral hypertrophy (greater on the right). Facet arthrosis. No appreciable significant spinal canal stenosis. Mild right neural foraminal narrowing. C4-C5: Grade 1 anterolisthesis. Disc uncovering. Uncinate hypertrophy. Facet hypertrophy which is prominent on the right. Degenerative fusion of the facet joint on the right. No appreciable significant spinal canal stenosis. Mild/moderate right neural foraminal narrowing. C5-C6: Shallow posterior disc osteophyte complex. Uncovertebral hypertrophy. Prominent facet hypertrophy. No appreciable significant spinal canal stenosis or neural foraminal narrowing. C6-C7: Degenerative fusion across the disc space. Uncovertebral hypertrophy. Facet hypertrophy. Degenerative fusion across the facet joint on the right. No appreciable significant spinal canal stenosis. Mild left neural foraminal narrowing. C7-T1: Uncovertebral hypertrophy. Mild facet arthrosis. No appreciable significant spinal canal stenosis or neural foraminal narrowing. Upper chest: There are innumerable nodules within the imaged lung apices. Partially visualized left pleural effusion. Other: Carotid artery atherosclerotic calcification. IMPRESSION: Cervical spondylosis as outlined multilevel degenerative fusion. No appreciable significant spinal canal stenosis at any level. Mild/moderate C4-C5 right neural foraminal narrowing. Additional sites of mild neural foraminal narrowing as described. Multilevel grade 1 spondylolisthesis. There are innumerable nodules within the imaged lung apices. Partially imaged left pleural effusion. Dedicated contrast-enhanced chest CT is recommended for further evaluation. Electronically Signed: By: Kellie Simmering DO On: 08/24/2019 12:59   MR BRAIN WO CONTRAST  Result Date: 08/24/2019 CLINICAL DATA:  Focal neuro deficit, greater than 6  hours, stroke suspected. Additional history provided: Left-sided arm weakness, chest tightness, known atrial fibrillation. EXAM: MRI HEAD WITHOUT CONTRAST TECHNIQUE: Multiplanar, multiecho pulse sequences of the brain and surrounding structures were obtained without intravenous contrast. COMPARISON:  Noncontrast head CT 08/24/2019 FINDINGS: Brain: Prominent susceptibility artifact arising from the face region obscures the portions of the right orbit, face and intracranial contents on multiple sequences. This limits evaluation. The susceptibility artifact appears to arise from a retained metallic foreign body within the forehead soft tissues as demonstrated on head CT performed earlier the same day. Due to marked susceptibility artifact the diffusion-weighted imaging is nondiagnostic and this precludes adequate evaluation for acute infarct. Redemonstrated 12 mm pedunculated mass within the body of the right lateral ventricle, arising from the lateral wall (series 7, image 16) (series 8, image 16). Mild patchy T2/FLAIR hyperintensity within the cerebral white matter is nonspecific, but consistent with chronic small vessel ischemic disease. Chronic lacunar infarcts within the right caudate head and within the cerebellum. Moderate generalized parenchymal atrophy. No chronic intracranial blood products are identified within described limitations. No extra-axial fluid collection is identified. No midline shift. Vascular: Expected proximal arterial flow voids. Skull and upper cervical spine: No focal marrow lesion is identified within described limitations. Sinuses/Orbits: No acute orbital abnormality identified. No significant paranasal sinus disease within described limitations. No significant mastoid effusion. IMPRESSION: Marked susceptibility artifact arising from a retained metallic foreign body within the face obscures the intracranial contents on multiple sequences and significantly limits evaluation. The  susceptibility artifact renders the diffusion-weighted imaging non-diagnostic and this precludes adequate evaluation for acute infarct. Redemonstrated 12 mm pedunculated mass within the body of the  right lateral ventricle. The mass is incompletely characterized on this non-contrast study. Nonemergent contrast-enhanced brain MRI is recommended for further evaluation. Chronic lacunar infarcts within the right basal ganglia and within the cerebellum. Mild chronic small vessel ischemic disease within the cerebral white matter. Moderate generalized parenchymal atrophy. Electronically Signed   By: Kellie Simmering DO   On: 08/24/2019 08:56   ECHOCARDIOGRAM COMPLETE  Result Date: 08/24/2019    ECHOCARDIOGRAM REPORT   Patient Name:   [name]. Date of Exam: 08/24/2019 Medical Rec #:  [id number]       Height:       71.0 in Accession #:    [id number]      Weight:       144.0 lb Date of Birth:  06/22/1931        BSA:          1.834 m Patient Age:    48 years        BP:           125/93 mmHg Patient Gender: M               HR:           85 bpm. Exam Location:  Inpatient Procedure: 2D Echo, Cardiac Doppler and Color Doppler Indications:    CVA  History:        Patient has prior history of Echocardiogram examinations, most                 recent 10/27/2013. CAD, Prior CABG, Carotid Disease and PAD,                 Aortic Valve Disease, Arrythmias:Atrial Fibrillation; Risk                 Factors:Hypertension and Dyslipidemia. Pleural effusion.  Sonographer:    Dustin Flock Referring Phys: Rainelle  Sonographer Comments: Technically difficult study due to poor echo windows. Image acquisition challenging due to respiratory motion and patient very thin. IMPRESSIONS  1. Left ventricular ejection fraction, by estimation, is 60 to 65%. The left ventricle has normal function. Left ventricular endocardial border not optimally defined to evaluate regional wall motion. There is moderate left ventricular hypertrophy. Left  ventricular diastolic parameters are indeterminate.  2. Right ventricular systolic function is mildly reduced. The right ventricular size is normal. There is normal pulmonary artery systolic pressure. The estimated right ventricular systolic pressure is 77.8 mmHg.  3. The mitral valve is normal in structure. Mild mitral valve regurgitation. No evidence of mitral stenosis.  4. Tricuspid valve regurgitation is moderate.  5. The aortic valve is severely calcified with severely reduced cusp mobility. Aortic valve regurgitation is not visualized. Mild to moderate aortic valve stenosis. Aortic valve mean gradient measures 17.0 mmHg. This is likely an underestimate of true severity due to suboptimal Doppler alignment and image quality.  6. The inferior vena cava is normal in size with greater than 50% respiratory variability, suggesting right atrial pressure of 3 mmHg. Conclusion(s)/Recommendation(s): No intracardiac source of embolism detected on this transthoracic study. A transesophageal echocardiogram is recommended to exclude cardiac source of embolism if clinically indicated. FINDINGS  Left Ventricle: Left ventricular ejection fraction, by estimation, is 60 to 65%. The left ventricle has normal function. Left ventricular endocardial border not optimally defined to evaluate regional wall motion. The left ventricular internal cavity size was normal in size. There is moderate left ventricular hypertrophy. Left ventricular diastolic parameters are indeterminate. Right Ventricle: The right  ventricular size is normal. No increase in right ventricular wall thickness. Right ventricular systolic function is mildly reduced. There is normal pulmonary artery systolic pressure. The tricuspid regurgitant velocity is 2.51 m/s, and with an assumed right atrial pressure of 3 mmHg, the estimated right ventricular systolic pressure is 22.2 mmHg. Left Atrium: Left atrial size was normal in size. Right Atrium: Right atrial size was  normal in size. Pericardium: There is no evidence of pericardial effusion. Mitral Valve: The mitral valve is normal in structure. Normal mobility of the mitral valve leaflets. Mild mitral valve regurgitation. No evidence of mitral valve stenosis. Tricuspid Valve: The tricuspid valve is normal in structure. Tricuspid valve regurgitation is moderate. Aortic Valve: The aortic valve is abnormal. Aortic valve regurgitation is not visualized. Mild to moderate aortic stenosis is present. There is severe calcifcation of the aortic valve. Aortic valve mean gradient measures 17.0 mmHg. Aortic valve peak gradient measures 38.2 mmHg. Aortic valve area, by VTI measures 1.01 cm. Pulmonic Valve: The pulmonic valve was not well visualized. Pulmonic valve regurgitation is not visualized. Aorta: The aortic root is normal in size and structure. Venous: The inferior vena cava is normal in size with greater than 50% respiratory variability, suggesting right atrial pressure of 3 mmHg. IAS/Shunts: The interatrial septum was not well visualized.  LEFT VENTRICLE PLAX 2D LVIDd:         2.68 cm  Diastology LVIDs:         1.97 cm  LV e' lateral:   6.20 cm/s LV PW:         1.49 cm  LV E/e' lateral: 10.0 LV IVS:        1.42 cm  LV e' medial:    7.18 cm/s LVOT diam:     2.30 cm  LV E/e' medial:  8.6 LV SV:         54 LV SV Index:   29 LVOT Area:     4.15 cm  RIGHT VENTRICLE RV Basal diam:  2.83 cm RV S prime:     8.05 cm/s TAPSE (M-mode): 1.5 cm LEFT ATRIUM             Index       RIGHT ATRIUM           Index LA diam:        4.00 cm 2.18 cm/m  RA Area:     16.80 cm LA Vol (A2C):   59.7 ml 32.56 ml/m RA Volume:   44.60 ml  24.32 ml/m LA Vol (A4C):   30.8 ml 16.80 ml/m LA Biplane Vol: 44.1 ml 24.05 ml/m  AORTIC VALVE AV Area (Vmax):    0.97 cm AV Area (Vmean):   1.09 cm AV Area (VTI):     1.01 cm AV Vmax:           309.00 cm/s AV Vmean:          179.000 cm/s AV VTI:            0.534 m AV Peak Grad:      38.2 mmHg AV Mean Grad:      17.0  mmHg LVOT Vmax:         72.40 cm/s LVOT Vmean:        46.800 cm/s LVOT VTI:          0.130 m LVOT/AV VTI ratio: 0.24  AORTA Ao Root diam: 3.20 cm MITRAL VALVE  TRICUSPID VALVE MV Area (PHT): 3.17 cm    TR Peak grad:   25.2 mmHg MV Decel Time: 239 msec    TR Vmax:        251.00 cm/s MV E velocity: 62.10 cm/s                            SHUNTS                            Systemic VTI:  0.13 m                            Systemic Diam: 2.30 cm Cherlynn Kaiser MD Electronically signed by Cherlynn Kaiser MD Signature Date/Time: 08/24/2019/10:41:57 PM    Final    CT HEAD CODE STROKE WO CONTRAST  Result Date: 08/24/2019 CLINICAL DATA:  Code stroke.  Left arm weakness EXAM: CT HEAD WITHOUT CONTRAST TECHNIQUE: Contiguous axial images were obtained from the base of the skull through the vertex without intravenous contrast. COMPARISON:  None. FINDINGS: Brain: No evidence of acute infarction, hemorrhage, or hydrocephalus. Remote lacunar infarct at the right caudate head. Prominent extra-axial CSF spaces but no definite cortical mass effect for hygroma. There is an isodense mass the level of the body right lateral ventricle measuring 11 mm. Small likely remote right cerebellar infarct. Vascular: No hyperdense vessel or unexpected calcification. Skull: Normal. Negative for fracture or focal lesion. Sinuses/Orbits: No acute finding. Other: These results were communicated to Dr. Cheral Marker at [place] 5/21/2021by text page via the [facility] messaging system. ASPECTS [facility] Stroke Program Early CT Score) - Ganglionic level infarction (caudate, lentiform nuclei, internal capsule, insula, M1-M3 cortex): 7 - Supraganglionic infarction (M4-M6 cortex): 3 Total score (0-10 with 10 being normal): 10 IMPRESSION: 1. No acute finding.  ASPECTS is 10. 2. 11 mm mass at the right lateral ventricle. 3. Remote appearing infarcts at the right caudate and cerebellum. Electronically Signed   By: Monte Fantasia M.D.   On: 08/24/2019 04:34    VAS US CAROTID  Result Date: 08/27/2019 Carotid Arterial Duplex Study Indications:  CVA. Risk Factors: Hypertension, hyperlipidemia. Performing Technologist: Maudry Mayhew MHA, RDMS, RVT, RDCS  Examination Guidelines: A complete evaluation includes B-mode imaging, spectral Doppler, color Doppler, and power Doppler as needed of all accessible portions of each vessel. Bilateral testing is considered an integral part of a complete examination. Limited examinations for reoccurring indications may be performed as noted.  Right Carotid Findings: +----------+-------+-------+--------+---------------------------------+--------+           PSV    EDV    StenosisPlaque Description               Comments           cm/s   cm/s                                                     +----------+-------+-------+--------+---------------------------------+--------+ CCA Prox  69     17             smooth and homogeneous                    +----------+-------+-------+--------+---------------------------------+--------+ CCA Distal69     7  smooth, heterogenous and calcific         +----------+-------+-------+--------+---------------------------------+--------+ ICA Prox  177    30             heterogenous, irregular and                                               calcific                                  +----------+-------+-------+--------+---------------------------------+--------+ ICA Distal74     26                                                       +----------+-------+-------+--------+---------------------------------+--------+ ECA       220    21             heterogenous, irregular and                                               calcific                                  +----------+-------+-------+--------+---------------------------------+--------+ +----------+--------+-------+----------------+-------------------+           PSV cm/sEDV  cmsDescribe        Arm Pressure (mmHG) +----------+--------+-------+----------------+-------------------+ [id number]             Multiphasic, WNL                    +----------+--------+-------+----------------+-------------------+ +---------+--------+--+--------+-+---------+ VertebralPSV cm/s25EDV cm/s8Antegrade +---------+--------+--+--------+-+---------+  Left Carotid Findings: +----------+-------+-------+--------+---------------------------------+--------+           PSV    EDV    StenosisPlaque Description               Comments           cm/s   cm/s                                                     +----------+-------+-------+--------+---------------------------------+--------+ CCA Prox  72     11             smooth and heterogenous                   +----------+-------+-------+--------+---------------------------------+--------+ CCA Distal52     12             calcific                                  +----------+-------+-------+--------+---------------------------------+--------+ ICA Prox  62     15             heterogenous, irregular and  calcific                                  +----------+-------+-------+--------+---------------------------------+--------+ ICA Distal88     13                                                       +----------+-------+-------+--------+---------------------------------+--------+ ECA       119    14             heterogenous and calcific                 +----------+-------+-------+--------+---------------------------------+--------+ +----------+--------+--------+----------------+-------------------+           PSV cm/sEDV cm/sDescribe        Arm Pressure (mmHG) +----------+--------+--------+----------------+-------------------+ [id number]              Multiphasic, WNL                     +----------+--------+--------+----------------+-------------------+ +---------+--------+--+--------+-+---------+ VertebralPSV cm/s34EDV cm/s7Antegrade +---------+--------+--+--------+-+---------+   Summary: Right Carotid: Velocities in the right ICA are consistent with a 1-39% stenosis. Left Carotid: Velocities in the left ICA are consistent with a 1-39% stenosis. Vertebrals:  Bilateral vertebral arteries demonstrate antegrade flow. Subclavians: Normal flow hemodynamics were seen in bilateral subclavian              arteries. *See table(s) above for measurements and observations.  Electronically signed by Antony Contras MD on 08/27/2019 at 1:43:39 PM.    Final    VAS [place] TRANSCRANIAL DOPPLER  Result Date: 08/27/2019  Transcranial Doppler Indications: Stroke. Limitations: Poor acoustic windows Limitations for diagnostic windows: Unable to insonate right transtemporal window. Performing Technologist: Maudry Mayhew MHA, RDMS, RVT, RDCS  Examination Guidelines: A complete evaluation includes B-mode imaging, spectral Doppler, color Doppler, and power Doppler as needed of all accessible portions of each vessel. Bilateral testing is considered an integral part of a complete examination. Limited examinations for reoccurring indications may be performed as noted.  +----------+-------------+----------+-----------+------------------+ RIGHT TCD Right VM (cm)Depth (cm)Pulsatility     Comment       +----------+-------------+----------+-----------+------------------+ MCA                                         Unable to insonate +----------+-------------+----------+-----------+------------------+ ACA                                         Unable to insonate +----------+-------------+----------+-----------+------------------+ Term ICA                                    Unable to insonate +----------+-------------+----------+-----------+------------------+ PCA                                          Unable to insonate +----------+-------------+----------+-----------+------------------+ Opthalmic     15.00                 1.47                       +----------+-------------+----------+-----------+------------------+  ICA siphon    16.00                 1.18                       +----------+-------------+----------+-----------+------------------+ Vertebral    -22.00                 1.10                       +----------+-------------+----------+-----------+------------------+  +----------+------------+----------+-----------+------------------+ LEFT TCD  Left VM (cm)Depth (cm)Pulsatility     Comment       +----------+------------+----------+-----------+------------------+ MCA          18.00                 1.48                       +----------+------------+----------+-----------+------------------+ ACA                                        Unable to insonate +----------+------------+----------+-----------+------------------+ Term ICA                                   Unable to insonate +----------+------------+----------+-----------+------------------+ PCA                                        Unable to insonate +----------+------------+----------+-----------+------------------+ Opthalmic                                  Unable to insonate +----------+------------+----------+-----------+------------------+ ICA siphon   14.00                 1.22                       +----------+------------+----------+-----------+------------------+ Vertebral    -16.00                0.89                       +----------+------------+----------+-----------+------------------+  +------------+-------+------------------+             VM cm/s     Comment       +------------+-------+------------------+ Prox Basilar       Unable to insonate +------------+-------+------------------+ Summary:  Absent right temporal and poor left temporal and  suboccipital window limts evaluation.Antegrade flow in both opthalmics and carotid siphons. *See table(s) above for TCD measurements and observations.  Diagnosing physician: Antony Contras MD Electronically signed by Antony Contras MD on 08/27/2019 at 1:47:05 PM.    Final      Labs:   Basic Metabolic Panel: Recent Labs  Lab 08/24/19 0422 08/24/19 0422 08/24/19 0431 08/25/19 0307  NA 137  --  136 134*  K 4.3   < > 4.4 4.2  CL 100  --  99 101  CO2 26  --   --  22  GLUCOSE 98  --  89 102*  BUN 37*  --  46* 34*  CREATININE 2.08*  --  1.80* 1.58*  CALCIUM 9.7  --   --  9.5  PHOS  --   --   --  2.9   < > = values in this interval not displayed.   GFR Estimated Creatinine Clearance: 31 mL/min (A) (by C-G formula based on SCr of 1.58 mg/dL (H)). Liver Function Tests: Recent Labs  Lab 08/24/19 0422 08/25/19 0307  AST 17  --   ALT 8  --   ALKPHOS 70  --   BILITOT 1.5*  --   PROT 6.0*  --   ALBUMIN 2.5* 2.3*   No results for input(s): LIPASE, AMYLASE in the last 168 hours. No results for input(s): AMMONIA in the last 168 hours. Coagulation profile Recent Labs  Lab 08/24/19 0422  INR 1.2    CBC: Recent Labs  Lab 08/24/19 0422 08/24/19 0431 08/25/19 0307 08/27/19 0447 08/28/19 0637  WBC 12.7*  --  12.8* 13.8* 13.7*  NEUTROABS 9.0*  --   --   --   --   HGB 14.5 15.6 14.0 14.6 14.3  HCT 44.9 46.0 43.0 44.9 44.9  MCV 91.3  --  89.6 90.5 91.4  PLT 381  --  344 320 320   Cardiac Enzymes: No results for input(s): CKTOTAL, CKMB, CKMBINDEX, TROPONINI in the last 168 hours. BNP: Invalid input(s): POCBNP CBG: No results for input(s): GLUCAP in the last 168 hours. D-Dimer No results for input(s): DDIMER in the last 72 hours. Hgb A1c No results for input(s): HGBA1C in the last 72 hours. Lipid Profile No results for input(s): CHOL, HDL, LDLCALC, TRIG, CHOLHDL, LDLDIRECT in the last 72 hours. Thyroid function studies No results for input(s): TSH, T4TOTAL, T3FREE, THYROIDAB  in the last 72 hours.  Invalid input(s): FREET3 Anemia work up No results for input(s): VITAMINB12, FOLATE, FERRITIN, TIBC, IRON, RETICCTPCT in the last 72 hours. Microbiology Recent Results (from the past 240 hour(s))  SARS Coronavirus 2 by RT PCR (hospital order, performed in [facility] hospital lab) Nasopharyngeal Nasopharyngeal Swab     Status: None   Collection Time: 08/24/19  4:53 AM   Specimen: Nasopharyngeal Swab  Result Value Ref Range Status   SARS Coronavirus 2 NEGATIVE NEGATIVE Final    Comment: (NOTE) SARS-CoV-2 target nucleic acids are NOT DETECTED. The SARS-CoV-2 RNA is generally detectable in upper and lower respiratory specimens during the acute phase of infection. The lowest concentration of SARS-CoV-2 viral copies this assay can detect is 250 copies / mL. A negative result does not preclude SARS-CoV-2 infection and should not be used as the sole basis for treatment or other patient management decisions.  A negative result may occur with improper specimen collection / handling, submission of specimen other than nasopharyngeal swab, presence of viral mutation(s) within the areas targeted by this assay, and inadequate number of viral copies (<250 copies / mL). A negative result must be combined with clinical observations, patient history, and epidemiological information. Fact Sheet for Patients:   StrictlyIdeas.no Fact Sheet for Healthcare Providers: BankingDealers.co.za This test is not yet approved or cleared  by the [place] FDA and has been authorized for detection and/or diagnosis of SARS-CoV-2 by FDA under an Emergency Use Authorization (EUA).  This EUA will remain in effect (meaning this test can be used) for the duration of the COVID-19 declaration under Section 564(b)(1) of the Act, 21 U.S.C. section 360bbb-3(b)(1), unless the authorization is terminated or revoked sooner. Performed at [facility], [place] [address], [place], [place] [address]   Gram stain     Status: None   Collection  Time: 08/27/19 10:26 AM   Specimen: Pleura  Result Value Ref Range Status   Specimen Description PLEURAL FLUID LEFT  Final   Special Requests NONE  Final   Gram Stain   Final    WBC PRESENT,BOTH PMN AND MONONUCLEAR NO ORGANISMS SEEN CYTOSPIN SMEAR Performed at [facility], [address], [place], [place] [address]    Report Status 08/27/2019 FINAL  Final    Please note: You were cared for by a hospitalist during your hospital stay. Once you are discharged, your primary care physician will handle any further medical issues. Please note that NO REFILLS for any discharge medications will be authorized once you are discharged, as it is imperative that you return to your primary care physician (or establish a relationship with a primary care physician if you do not have one) for your post hospital discharge needs so that they can reassess your need for medications and monitor your lab values.  Signed: Terrilee Croak  [facility] 08/28/2019, 1:27 PM

## 2019-08-28 NOTE — Plan of Care (Signed)
  Problem: Education: Goal: Knowledge of General Education information will improve Description: Including pain rating scale, medication(s)/side effects and non-pharmacologic comfort measures Outcome: Progressing   Problem: Nutrition: Goal: Adequate nutrition will be maintained Outcome: Progressing   Problem: Safety: Goal: Ability to remain free from injury will improve Outcome: Progressing   

## 2019-08-28 NOTE — TOC Progression Note (Signed)
Transition of Care (TOC) - Progression Note    Patient Details  Name: [name]. MRN: [id number] Date of Birth: 05/15/1931  Transition of [facility]) CM/SW Contact  Pollie Friar, RN Phone Number: 08/28/2019, 1:01 PM  Clinical Narrative:    CM spoke to son and he is not comfortable taking the patient home with hospice and would like pt to d/c to [facility]. CM has updated Crislyn with [facility]. They will not have a bed for him today but hope for one tomorrow. MD and son updated. Crislyn will see the patient later and call the son.  TOC following.      Barriers to Discharge: No Barriers Identified  Expected Discharge Plan and Services           Expected Discharge Date: 08/28/19                           [place] Date [place]: 08/27/19   Representative spoke with at [place]: [place] ([place]) Interventions    Readmission Risk Interventions No flowsheet data found.

## 2019-08-28 NOTE — Telephone Encounter (Signed)
Contacted pt's son, Brevin, who reports pt is currently in the hospital and wanted to leave the number for the pt's room if Dr. Damita [place] wanted to speak to the pt. Tamer reports he is coherent. Sourish also reports the pt has consented to hospice. Advised if anything is needed to contact the office. Jayd verbalized understanding. Phone number to pt's room is [phone]

## 2019-08-28 NOTE — Telephone Encounter (Signed)
Patient is being discharged from the hospital today - I received a call from [name] with [facility] stating that patient is going home with hospice services, and they are wondering if Dr. Damita [place] will be the attending? Please advise.

## 2019-08-28 NOTE — Progress Notes (Signed)
[place] for heparin Indication: 5/21 CVA and atrial fibrillation (apixaban on hold) / STEMI  Allergies  Allergen Reactions  . Rivaroxaban Hives, Itching and Swelling    Hives, swelling, itching Hives, swelling, itching  . Lovastatin Other (See Comments)    muscle aches  . Pantoprazole Other (See Comments)    PPI likely caused blood in urine.    . Proton Pump Inhibitors Other (See Comments)    Blood in urine while on PPI PPI likely caused blood in urine.  Blood in urine while on PPI  . Zetia [Ezetimibe] Diarrhea    Plus blood in urine, nausea, fatigue, dizzyness (possibility this was due to bladder cancer and not the medication)    Patient Measurements: Height: 5\' 11"  (180.3 cm) Weight: 65.3 kg (144 lb) IBW/kg (Calculated) : 75.3 Heparin Dosing Weight: 65kg  Vital Signs: Temp: 98 F (36.7 C) (05/25 0800) Temp Source: Axillary (05/25 0358) BP: 119/76 (05/25 0358) Pulse Rate: 72 (05/25 0358)  Labs: Recent Labs    08/26/19 2058 08/27/19 0447 08/27/19 1426 08/28/19 0637  HGB  --  14.6  --  14.3  HCT  --  44.9  --  44.9  PLT  --  320  --  320  APTT 149* 58*  --  72*  HEPARINUNFRC  --  0.41  --  0.32  TROPONINIHS  --   --  120*  --     Estimated Creatinine Clearance: 31 mL/min (A) (by C-G formula based on SCr of 1.58 mg/dL (H)).   Assessment: 86 yo M on apixaban PTA for afib (CHADS2VASc = 6) now with small CVA. Apixaban held for procedures, last dose 5/22 8:50am. Now to heparin IV; Per MD, no bolus and low goal for CVA. H/H, plt stable.   S/p thoracentesis 5/24 with possible Stemi afterwards, planning hospice care eventually  PTT and heparin level therapeutic this AM  Goal of Therapy:  Heparin level 0.3 -0.5 units/ml  APTT 66-85 sec Monitor platelets by anticoagulation protocol: Yes   Plan:  Continue heparin at 700 units / hr Follow up AM labs DC PTTs  Thank you Anette Guarneri, PharmD [phone]  Please check  AMION for all [place] numbers  08/28/2019 8:54 AM

## 2019-08-28 NOTE — Progress Notes (Signed)
Engineer, maintenance [facility]) Hospital Liaison note.   Received request from [place] for family interest in hospice IPU. This RN visited with pt and son Latron at bedside to discuss goals of care and to verify interest. Son Rushil requests [facility] in [place] d/t closer proximity to his home.  Pt has been approved by [facility] MD. Hospice Home is unable to offer a room today. Hospital Liaison will follow up tomorrow or sooner if a room becomes available.    Please do not hesitate to call with questions.    Thank you for the opportunity to participate in this pt's care.  Domenic Moras, BSN, [employer] [phone]

## 2019-08-28 NOTE — Progress Notes (Signed)
PT Cancellation Note  Patient Details Name: [name]. MRN: [id number] DOB: 11/07/1931   Cancelled Treatment:    Reason Eval/Treat Not Completed: Other (comment) PT held today given MI 5/24. OT reports pt's RR into 30s while doing hand exercises in bed this am. Noted plan is for d/c to [facility] for hospice services likely tomorrow.    Earney Navy, PTA Acute Rehabilitation Services Pager: [phone] Office: [phone]   08/28/2019, 1:24 PM

## 2019-08-28 NOTE — Telephone Encounter (Signed)
Yes, I would like hospice help, please make sure patient has consented to hospice care.  Thanks.

## 2019-08-28 NOTE — Progress Notes (Signed)
Occupational Therapy Treatment Patient Details Name: [name]. MRN: [id number] DOB: Aug 28, 1931 Today's Date: 08/28/2019    History of present illness Pt is an 86 y/o male with PMHx including bladder cancer; CAD s/p CABG; carotid stenosis; stage 3 CKD; afib on Eliquis; HTN; HLD; and PVD presenting with acute LUE weakness. Of note pt also has had chronic dysphagia for a couple of weeks, had an EGD on 5/11; he had esophageal dilation with some improvement. C-spine was unremarkable but apices of lungs with "innumerable nodules" concerning for metastatic bladder cancer noted. MRI brain-chronic lacunar infarcts within the right basal ganglia and within the cerebellum but overall nondiagnostic due to artifact with metallic objects. Suspected small R MCA motor strip infarct. s/p thoracentesis 5/24. Stay complicated by MI 1/60/10.   OT comments  Patient supine in bed and eager to participate in OT. Reporting frustration with his L hand and voicing "this is my priority". Reviewed exercises as below, stretch to L hand and theraputty exercises per pt request.  Encouraged keeping L hand open at rest due to tightness.  Plan to dc with hospice now, updated DC plan.  Will follow acutely.     Follow Up Recommendations  No OT follow up(dcing with hospice)    Equipment Recommendations  Tub/shower seat    Recommendations for Other Services      Precautions / Restrictions Precautions Precautions: Fall Restrictions Weight Bearing Restrictions: No       Mobility Bed Mobility                  Transfers                      Balance                                           ADL either performed or assessed with clinical judgement   ADL                                         General ADL Comments: session focused on L hand exercises only      Vision       Perception     Praxis      Cognition Arousal/Alertness: Awake/alert Behavior  During Therapy: WFL for tasks assessed/performed Overall Cognitive Status: Within Functional Limits for tasks assessed                                 General Comments: reports decreased recall of exercises to L hand         Exercises Exercises: Other exercises Other Exercises Other Exercises: PROM of L hand, then SROM "prayer" position for stretch x 5 with rest breaks  Other Exercises: Reviewed L hand exercises specifically: gross grasp, wrist extension, finger ab/adduction and tip to tip (noted increased difficulty with index finger flexion, but with assisted thumb abudction and increased time pt demonstrates functional flexion of index finger)  Other Exercises: issued tan theraputty, reviewed gross grasp and pinching for L hand increased functional use    Shoulder Instructions       General Comments      Pertinent Vitals/ Pain       Pain Assessment: No/denies pain  Home Living                                          Prior Functioning/Environment              Frequency  Min 2X/week        Progress Toward Goals  OT Goals(current goals can now be found in the care plan section)  Progress towards OT goals: Progressing toward goals  Acute Rehab OT Goals Patient Stated Goal: to use my L hand better  OT Goal Formulation: With patient  Plan Frequency remains appropriate;Discharge plan needs to be updated    Co-evaluation                 AM-PAC OT "6 Clicks" Daily Activity     Outcome Measure   Help from another person eating meals?: A Little Help from another person taking care of personal grooming?: A Little Help from another person toileting, which includes using toliet, bedpan, or urinal?: A Little Help from another person bathing (including washing, rinsing, drying)?: A Little Help from another person to put on and taking off regular upper body clothing?: A Little Help from another person to put on and taking off  regular lower body clothing?: A Little 6 Click Score: 18    End of Session    OT Visit Diagnosis: Muscle weakness (generalized) (M62.81);Unsteadiness on feet (R26.81);Hemiplegia and hemiparesis Hemiplegia - Right/Left: Left Hemiplegia - dominant/non-dominant: Non-Dominant Hemiplegia - caused by: Cerebral infarction   Activity Tolerance Patient tolerated treatment well   Patient Left in bed;with call bell/phone within reach;with bed alarm set   Nurse Communication Mobility status        Time: 9211-9417 OT Time Calculation (min): 21 min  Charges: OT General Charges $OT Visit: 1 Visit OT Treatments $Neuromuscular Re-education: 8-22 mins  Jolaine Artist, [place] Pager [phone] Office [phone]    Delight Stare 08/28/2019, 1:07 PM

## 2019-08-29 DIAGNOSIS — Z515 Encounter for palliative care: Secondary | ICD-10-CM | POA: Insufficient documentation

## 2019-08-29 LAB — CBC
HCT: 40.5 % (ref 39.0–52.0)
Hemoglobin: 13.1 g/dL (ref 13.0–17.0)
MCH: 29.1 pg (ref 26.0–34.0)
MCHC: 32.3 g/dL (ref 30.0–36.0)
MCV: 90 fL (ref 80.0–100.0)
Platelets: 305 10*3/uL (ref 150–400)
RBC: 4.5 MIL/uL (ref 4.22–5.81)
RDW: 12.9 % (ref 11.5–15.5)
WBC: 12.5 10*3/uL — ABNORMAL HIGH (ref 4.0–10.5)
nRBC: 0 % (ref 0.0–0.2)

## 2019-08-29 LAB — CYTOLOGY - NON PAP

## 2019-08-29 LAB — HEPARIN LEVEL (UNFRACTIONATED): Heparin Unfractionated: 0.14 IU/mL — ABNORMAL LOW (ref 0.30–0.70)

## 2019-08-29 MED ORDER — APIXABAN 2.5 MG PO TABS
2.5000 mg | ORAL_TABLET | Freq: Two times a day (BID) | ORAL | Status: DC
  Administered 2019-08-29 – 2019-08-30 (×3): 2.5 mg via ORAL
  Filled 2019-08-29 (×3): qty 1

## 2019-08-29 NOTE — Plan of Care (Signed)
  Problem: Coping: Goal: Level of anxiety will decrease Outcome: Progressing   Problem: Pain Managment: Goal: General experience of comfort will improve Outcome: Progressing   Problem: Safety: Goal: Ability to remain free from injury will improve Outcome: Progressing   

## 2019-08-29 NOTE — Progress Notes (Signed)
PT Discharge Note  Patient Details Name: [name]. MRN: [id number] DOB: January 05, 1932   Cancelled Treatment:    Reason Eval/Treat Not Completed: Medical issues which prohibited therapy  Patient with new MI, newly diagnosed cancer with metastases to brain, and is planning to discharge to Hospice house.   No further indications for PT.    Arby Barrette, PT Pager [phone]   Rexanne Mano 08/29/2019, 12:59 PM

## 2019-08-29 NOTE — Progress Notes (Signed)
[facility] liaison note  This RN visited with pt at bedside briefly and spoke with son Chrisopher to confirm wait list for Hospice Home.  TOC Kelli updated.  Hospital liaison will follow up tomorrow or sooner if bed becomes available.    Please do not hesitate to call with questions.    Thank you for the opportunity to participate in this pt's care.  Domenic Moras, BSN, [employer] [phone]

## 2019-08-29 NOTE — Telephone Encounter (Signed)
Late entry.  Called patient yesterday while he was still at the hospital.  We talked about his recent diagnoses and his hospital course.  He made the decision to transition over to hospice and is awaiting hospital discharge at the time of our phone conversation.  I gave him my condolences about his recent troubles.  I told him that I was always glad to talk to him and he thanked me for calling.  I thanked him for talking with me.  I told him that I had been getting updates from the inpatient team.  He was glad to know about that.  I appreciate the help of all involved, especially hospice, caring for this kind gentleman.

## 2019-08-29 NOTE — Progress Notes (Signed)
PROGRESS NOTE  [name].  DOB: Mar 30, 1932  PCP: Tonia Ghent, MD DGL:[id number]  DOA: 08/24/2019  LOS: 5 days   Chief Complaint  Patient presents with  . Code Stroke   Brief narrative: Kenneth Marhefka. is an 86 y.o. male with a PMHx of atrial fibrillation  , bladder cancer s/p resection with urostomy, CAD s/p CABG in 1983, bilateral carotid artery stenosis, CKD stage III, HTN, HLD and PVD. Patient presented to the ED on 5/21 from home with new onset of LUE weakness that he noticed on awakening at 0300, after going to bed at 1900 while still at his baseline. EMS noted some LUE drift and left hand weakness.  Of note, he is on Eliquis for his atrial fibrillation. He has had chronic dysphagia for a couple of weeks, had an EGD on 5/11; he had esophageal dilation with some improvement.  In the ED, he was hemodynamically stable. Admitted for stroke work-up.Neurology consult obtained.  CT scan of head did not show any acute infarct but showed remote appearing infarct in the right caudate and cerebellum.  He was also found to have 11 mm mass at the right lateral ventricle. MRI confirmed a 12 mm pedunculated mass within the body of the right lateral ventricle. CT scan of chest, abdomen and pelvis with contrast was obtained. It showed 1. innumerable subcentimeter noncalcified lung nodules scattered throughout both lungs, consistent with metastatic disease. 2. 5.2 cm x 3.0 cm patchy area of masslike consolidation within the posteromedial aspect of the left upper lobe, which may represent a primary neoplasm. 3. Small to moderate sized patchy areas of right upper lobe, posterior right middle lobe and superior right lower lobe atelectasis and/or infiltrate. 4. Moderate size left pleural effusion, and small right pleural effusion.  He was admitted for acute stroke. Imagings also identified left lung cancer with metastasis. Hospital course also complicated by acute MI.  Subjective: Patient was  seen and examined.  He was alert awake and able to communicate.  We discussed about his plan to go to hospice to spend his final days and patient is very aware about it. Denies any chest pain or shortness of breath.  Denies any pain elsewhere.  Assessment/Plan: Acute left hemiplegia secondary to brain tumor, suspected metastatic lung cancer to the brain. -Presented with acute onset L hand weakness.  Continues to have weakness on handgrip. -CT scan head and MRI brain findings as above did not show any acute infarct but showed remote appearing infarct in the right carotid and cerebellum. -Neurology consult appreciated.  He probably has a small right MCA moderate restrictive infarct despite being on Eliquis. -Lipid panel with HDL 45, LDL 47.  A1c 6.1.   -TSH low at 0.031.  -Echocardiogram with EF 60 to 65%, indeterminate diastolic function. -Bilateral carotid artery Doppler with no significant stenosis. -Normal transcranial Doppler report. -PT/OT/ST eval obtained.  -Continue home medicines that include Eliquis, aspirin, statin as he is able to take it and plan to continue at hospice until he can take it.  Possible lung cancer with brain mets -Imaging findings as above showing left upper lobe mass with moderate-sized left pleural effusion, bilateral pulmonary nodules as well as a 12 mm pedunculated mass in the right lateral ventricular brain. -Oncology consult obtained. -Patient underwent left thoracentesis today.  1.7 L of serosanguineous fluid drained.  Sent for cytology. -Patient and family refuses any aggressive care.  Transitioning to hospice.  Acute ST segment elevation MI -Sudden onset 10  out of 10 chest pain 5/24. -Patient and family refused any aggressive care. -Medical management.  No intervention.  Cardiovascular issues : HTN, HLD, CAD sp CABG in 1983, PAD, A. fib -Home meds include Toprol 25 mg daily along with Eliquis, aspirin and Crestor.   -Continue other cardiac  meds. -Continue to monitor in telemetry. Continue to monitor blood pressure  CKD stage IV -Appears to have progressed to stage 4 CKD and is stable at this time  Bladder cancer -Ileostomy in place, otherwise no current issues  Severe protein calorie malnutrition -Nutrition consult obtained.  Dietary supplements ordered.  Mobility: PT OT eval Code Status:  DNR per chart  DVT prophylaxis:  Eliquis Antimicrobials:  None Fluid: None Diet: Cardiac diet  Consultants: Neurology Family Communication:  None at bedside  Status is: Inpatient  Remains inpatient appropriate because: Unsafe discharge.   Dispo: The patient is from: Home              Anticipated d/c is to: Inpatient hospice.              Anticipated d/c date is: 1 day            Patient is currently medically stable to transfer to hospice level of care only.  Waiting for bed availability at hospice facility.  Antimicrobials: Anti-infectives (From admission, onward)   None        Code Status: DNR   Diet Order            Diet Heart Room service appropriate? Yes; Fluid consistency: Thin  Diet effective now              Infusions:    Scheduled Meds: .  stroke: mapping our early stages of recovery book   Does not apply Once  . allopurinol  100 mg Oral QODAY  . apixaban  2.5 mg Oral BID  . aspirin EC  81 mg Oral Daily  . famotidine  40 mg Oral BID  . feeding supplement (ENSURE ENLIVE)  237 mL Oral QID  . ferrous sulfate  325 mg Oral QODAY  . metoprolol succinate  25 mg Oral Daily  . multivitamin with minerals  1 tablet Oral Daily  . nitroGLYCERIN  0.5 inch Topical Q6H  . rosuvastatin  5 mg Oral Daily    PRN meds: acetaminophen **OR** acetaminophen (TYLENOL) oral liquid 160 mg/5 mL **OR** acetaminophen, lidocaine, morphine injection, nitroGLYCERIN, polyethylene glycol   Objective: Vitals:   08/29/19 0818 08/29/19 0904  BP: 127/80   Pulse: 87 74  Resp: 18   Temp: 97.7 F (36.5 C)   SpO2: 93%      Intake/Output Summary (Last 24 hours) at 08/29/2019 1213 Last data filed at 08/29/2019 0800 Gross per 24 hour  Intake 240 ml  Output 700 ml  Net -460 ml   Filed Weights   08/24/19 0436  Weight: 65.3 kg   Weight change:  Body mass index is 20.08 kg/m.   Physical Exam:  General exam: Chronically sick looking.  Pleasant and comfortable.  Able to have good conversation. Skin: No rashes, lesions or ulcers. HEENT: Atraumatic, normocephalic, supple neck, Lungs: Clear to auscultation bilaterally CVS: Regular rate and rhythm, no murmur GI/Abd soft, nontender, nondistended, bowel sound present CNS: Alert and awake x3. Extremities: No pedal edema, no calf tenderness  Data Review: I have personally reviewed the laboratory data and studies available.  Recent Labs  Lab 08/24/19 0422 08/24/19 0422 08/24/19 2426 08/25/19 8341 08/27/19 9622 08/28/19 2979 08/29/19 8921  WBC 12.7*  --   --  12.8* 13.8* 13.7* 12.5*  NEUTROABS 9.0*  --   --   --   --   --   --   HGB 14.5   < > 15.6 14.0 14.6 14.3 13.1  HCT 44.9   < > 46.0 43.0 44.9 44.9 40.5  MCV 91.3  --   --  89.6 90.5 91.4 90.0  PLT 381  --   --  344 320 320 305   < > = values in this interval not displayed.   Recent Labs  Lab 08/24/19 0422 08/24/19 0431 08/25/19 0307  NA 137 136 134*  K 4.3 4.4 4.2  CL 100 99 101  CO2 26  --  22  GLUCOSE 98 89 102*  BUN 37* 46* 34*  CREATININE 2.08* 1.80* 1.58*  CALCIUM 9.7  --  9.5  PHOS  --   --  2.9    Signed, Barb Merino, MD [facility]

## 2019-08-30 LAB — CBC
HCT: 43 % (ref 39.0–52.0)
Hemoglobin: 13.7 g/dL (ref 13.0–17.0)
MCH: 29 pg (ref 26.0–34.0)
MCHC: 31.9 g/dL (ref 30.0–36.0)
MCV: 91.1 fL (ref 80.0–100.0)
Platelets: 296 10*3/uL (ref 150–400)
RBC: 4.72 MIL/uL (ref 4.22–5.81)
RDW: 12.9 % (ref 11.5–15.5)
WBC: 12.9 10*3/uL — ABNORMAL HIGH (ref 4.0–10.5)
nRBC: 0 % (ref 0.0–0.2)

## 2019-08-30 NOTE — Plan of Care (Signed)
  Problem: Education: Goal: Knowledge of General Education information will improve Description: Including pain rating scale, medication(s)/side effects and non-pharmacologic comfort measures Outcome: Progressing   Problem: Activity: Goal: Risk for activity intolerance will decrease Outcome: Progressing   Problem: Nutrition: Goal: Adequate nutrition will be maintained Outcome: Progressing   Problem: Coping: Goal: Level of anxiety will decrease Outcome: Progressing   

## 2019-08-30 NOTE — Discharge Summary (Signed)
Physician Discharge Summary  [name] ZYS:[id number] DOB: 18-Sep-1931 DOA: 08/24/2019  PCP: Tonia Ghent, MD  Admit date: 08/24/2019 Discharge date: 08/30/2019  Admitted From: Home Discharge disposition: Inpatient hospice    Code Status: DNR  Diet Recommendation: Cardiac diet, regular diet    Recommendations for Outpatient Follow-Up:   1. Per hospice protocol  Discharge Diagnosis:   Principal Problem:   Left hand weakness Active Problems:   HYPERTENSION, BENIGN ESSENTIAL   Bladder cancer (HCC)   CKD (chronic kidney disease), stage III   Chronic atrial fibrillation (HCC)   PVD (peripheral vascular disease) (HCC)   Hyperlipidemia   Protein-calorie malnutrition, severe    History of Present Illness / Brief narrative:  SHAAN RHOADS Jr.is an 86 y.o.malewith a PMHx of atrial fibrillation  , bladder cancer s/p resection with urostomy, CAD s/p CABG in 1983, bilateral carotid artery stenosis, CKD stage III, HTN, HLD and PVD. Patient presented to the ED on 5/21 from home with new onset of LUE weakness that he noticed on awakening at 0300, after going to bed at 1900 while still at his baseline.  EMS noted some LUE drift and left hand weakness.  Of note, he is on Eliquis for his atrial fibrillation.He has had chronic dysphagia for a couple of weeks, had an EGD on 5/11; he had esophageal dilation with some improvement.  In the ED, he was hemodynamically stable.  Admitted for stroke work-up. Neurology consult obtained.  CT scan of head did not show any acute infarct but showed remote appearing infarct in the right caudate and cerebellum.  He was also found to have 11 mm mass at the right lateral ventricle. MRI confirmed a 12 mm pedunculated mass within the body of the right lateral ventricle. CT scan of chest, abdomen and pelvis with contrast was obtained. It showed 1. innumerable subcentimeter noncalcified lung nodules scattered throughout both lungs, consistent with  metastatic disease. 2. 5.2 cm x 3.0 cm patchy area of masslike consolidation within the posteromedial aspect of the left upper lobe, which may represent a primary neoplasm. 3. Small to moderate sized patchy areas of right upper lobe, posterior right middle lobe and superior right lower lobe atelectasis and/or infiltrate. 4. Moderate size left pleural effusion, and small right pleural effusion.  He was admitted for acute stroke. Imagings also identified left lung cancer with metastasis. Hospital course also complicated by acute MI. Patient does not want any aggressive care.  He wants to continue his regular meds and stay comfortable.  Hospice option was discussed.  Patient and son are agreeable to discharge with hospice.  And will be discharged to hospice today.  Hospital Course:   Acute left hemiplegia secondary to brain tumor, suspected metastatic lung cancer to the brain. -Presented with acute onset L hand weakness.  Continues to have weakness on handgrip. -CT scan head and MRI brain findings as above did not show any acute infarct but showed remote appearing infarct in the right carotid and cerebellum. -Neurology consult appreciated.  He probably has a small right MCA moderate restrictive infarct despite being on Eliquis. -Lipid panel with HDL 45, LDL 47.  A1c 6.1.   -TSH low at 0.031.  -Echocardiogram with EF 60 to 65%, indeterminate diastolic function. -Bilateral carotid artery Doppler with no significant stenosis. -Normal transcranial Doppler report. -PT/OT/ST eval obtained.  -Continue home medicines that include Eliquis, aspirin, statin as he is able to take it and plan to continue at hospice until he can take it.  Possible lung cancer with brain mets -Imaging findings as above showing left upper lobe mass with moderate-sized left pleural effusion, bilateral pulmonary nodules as well as a 12 mm pedunculated mass in the right lateral ventricular brain. -Oncology consult  obtained. -Patient underwent left thoracentesis .  1.7 L of serosanguineous fluid drained.  Sent for cytology. -Patient and family refuses any aggressive care.  Transitioning to hospice.  Acute ST segment elevation MI -Sudden onset 10 out of 10 chest pain 5/24. -Patient and family refused any aggressive care. -Medical management.  No intervention.  Cardiovascular issues : HTN, HLD, CAD sp CABG in 1983, PAD, A. fib -Home meds include Toprol 25 mg daily along with Eliquis, aspirin and Crestor.   -Continue other cardiac meds. -Continue to monitor in telemetry. Continue to monitor blood pressure  CKD stage IV -Appears to have progressed to stage 4 CKD and is stable at this time Bladder cancer -Ileostomy in place, otherwise no current issues  Severe protein calorie malnutrition -Nutrition consult obtained.  Dietary supplements ordered   Subjective:  Patient seen and examined.  Received few doses of morphine last 24 hours.  Slept well last night.  Denies any chest pain or shortness of breath.  He is aware that he is going to hospice house today.  Discharge Exam:   Vitals:   08/29/19 2007 08/29/19 2356 08/30/19 0415 08/30/19 0846  BP: 135/79 121/68 (!) 143/82 124/83  Pulse: 82 79 88 78  Resp: 20 19 20 19   Temp: 98 F (36.7 C) 98.3 F (36.8 C) 98.5 F (36.9 C) 98.7 F (37.1 C)  TempSrc: Oral Oral Oral Oral  SpO2: 94% 95% 93% 92%  Weight:      Height:        Body mass index is 20.08 kg/m.  General exam: Appears calm and comfortable.  Chronically sick looking. Skin: No rashes, lesions or ulcers. HEENT: Atraumatic, normocephalic, supple neck, no obvious bleeding Lungs: Clear to auscultation bilaterally CVS: Regular rate and rhythm, no murmur GI/Abd soft, nontender, nondistended, bowel sound present CNS: Alert, awake, oriented x3 Extremities: No pedal edema, no calf tenderness  Discharge Instructions:  Wound care: None.  Continue ileostomy care as before. Discharge  Instructions    Increase activity slowly   Complete by: As directed       Allergies as of 08/30/2019      Reactions   Rivaroxaban Hives, Itching, Swelling   Hives, swelling, itching Hives, swelling, itching   Lovastatin Other (See Comments)   muscle aches   Pantoprazole Other (See Comments)   PPI likely caused blood in urine.     Proton Pump Inhibitors Other (See Comments)   Blood in urine while on PPI PPI likely caused blood in urine.  Blood in urine while on PPI   Zetia [ezetimibe] Diarrhea   Plus blood in urine, nausea, fatigue, dizzyness (possibility this was due to bladder cancer and not the medication)      Medication List    TAKE these medications   acetaminophen 325 MG tablet Commonly known as: TYLENOL Take 2 tablets (650 mg total) by mouth every 4 (four) hours as needed for mild pain (or temp > 37.5 C (99.5 F)).   allopurinol 100 MG tablet Commonly known as: ZYLOPRIM TAKE 1 TABLET BY MOUTH EVERY OTHER DAY   apixaban 2.5 MG Tabs tablet Commonly known as: ELIQUIS Take 1 tablet (2.5 mg total) by mouth 2 (two) times daily.   aspirin EC 81 MG tablet Take 1 tablet (81 mg  total) by mouth daily.   cholecalciferol 25 MCG (1000 UNIT) tablet Commonly known as: VITAMIN D Take 1,000 Units by mouth daily.   famotidine 40 MG tablet Commonly known as: PEPCID Take 1 tablet (40 mg total) by mouth 2 (two) times daily.   feeding supplement (ENSURE ENLIVE) Liqd Take 237 mLs by mouth 3 (three) times daily between meals.   feeding supplement (ENSURE ENLIVE) Liqd Take 237 mLs by mouth 4 (four) times daily.   ferrous sulfate 325 (65 FE) MG tablet TAKE 1 TABLET BY MOUTH EVERY OTHER DAY WITH BREAKFAST What changed:   how much to take  how to take this  when to take this  additional instructions   fexofenadine 180 MG tablet Commonly known as: Allegra Allergy Take 1 tablet (180 mg total) by mouth daily as needed for allergies.   fluticasone 50 MCG/ACT nasal  spray Commonly known as: FLONASE Place 2 sprays into both nostrils daily as needed for allergies.   metoprolol succinate 25 MG 24 hr tablet Commonly known as: TOPROL-XL TAKE 1 TABLET (25 MG TOTAL) BY MOUTH DAILY. TAKE WITH OR IMMEDIATELY FOLLOWING A MEAL.   morphine 15 MG tablet Commonly known as: MSIR Take 1 tablet (15 mg total) by mouth every 4 (four) hours as needed for up to 3 days for severe pain.   nitroGLYCERIN 0.4 MG SL tablet Commonly known as: Nitrostat Place 1 tablet (0.4 mg total) under the tongue every 5 (five) minutes as needed for chest pain. What changed: Another medication with the same name was added. Make sure you understand how and when to take each.   nitroGLYCERIN 2 % ointment Commonly known as: NITROGLYN Apply 0.5 inches topically every 6 (six) hours. What changed: You were already taking a medication with the same name, and this prescription was added. Make sure you understand how and when to take each.   polyethylene glycol 17 g packet Commonly known as: MIRALAX / GLYCOLAX Take 17 g by mouth daily as needed for mild constipation.   rosuvastatin 5 MG tablet Commonly known as: CRESTOR TAKE ONE OR TWO TABLETS DAILY AT 6PM (ALTERNATING EACH DAY) What changed: See the new instructions.   STOOL SOFTENER PO Take 1 capsule by mouth daily as needed (Constipation).   triamcinolone cream 0.1 % Commonly known as: KENALOG Apply 1 application topically daily as needed (itching).       Time coordinating discharge: 35 minutes  The results of significant diagnostics from this hospitalization (including imaging, microbiology, ancillary and laboratory) are listed below for reference.    Procedures and Diagnostic Studies:   CT ABDOMEN PELVIS WO CONTRAST  Result Date: 08/24/2019 CLINICAL DATA:  Urologic cancer, surveillance. EXAM: CT CHEST, ABDOMEN AND PELVIS WITHOUT CONTRAST TECHNIQUE: Multidetector CT imaging of the chest, abdomen and pelvis was performed  following the standard protocol without IV contrast. COMPARISON:  None. FINDINGS: CT CHEST FINDINGS Cardiovascular: There is marked severity calcification of the aortic arch. Normal heart size. No pericardial effusion. Marked severity coronary artery calcification is seen. Mediastinum/Nodes: No enlarged mediastinal, hilar, or axillary lymph nodes. Thyroid gland, trachea, and esophagus demonstrate no significant findings. Lungs/Pleura: Innumerable subcentimeter noncalcified lung nodules are seen scattered throughout both lungs. A 5.2 cm x 3.0 cm patchy area of masslike consolidation is seen within the posteromedial aspect of the left upper lobe. Small to moderate sized patchy areas of atelectasis and/or infiltrate are seen within the right upper lobe posteromedial right middle lobe and superior aspect of the right lower lobe. Mild atelectasis  is seen along the posterior aspect of the left lung base. There is a moderate size left pleural effusion. A small right pleural effusion is also seen. No pneumothorax is identified. Musculoskeletal: Multiple sternal wires are seen. Degenerative changes seen within the thoracic spine. CT ABDOMEN PELVIS FINDINGS Hepatobiliary: No focal liver abnormality is seen. Multiple subcentimeter gallstones are seen within the lumen of an otherwise normal-appearing gallbladder. There is no evidence of gallbladder wall thickening or biliary dilatation. Pancreas: Unremarkable. No pancreatic ductal dilatation or surrounding inflammatory changes. Spleen: Normal in size without focal abnormality. Adrenals/Urinary Tract: Adrenal glands are unremarkable. The left kidney is atrophic in appearance. A 1.3 cm cyst is seen within the lateral aspect of the mid left kidney. There is marked severity left-sided hydronephrosis and hydroureter with prominence of the intrarenal calices. The right kidney is normal in size, without renal calculi, focal lesion, or hydronephrosis. The urinary bladder is absent.  Stomach/Bowel: There is a moderate-sized hiatal hernia. The appendix is not clearly identified. No evidence of bowel dilatation. Noninflamed diverticula are seen throughout the sigmoid colon. Vascular/Lymphatic: Marked severity aortic calcification. No enlarged abdominal or pelvic lymph nodes. Reproductive: The prostate gland is absent. Other: A right lower quadrant ostomy site is noted. No abdominopelvic ascites. Musculoskeletal: Moderate to marked severity multilevel degenerative changes seen throughout the lumbar spine. IMPRESSION: 1. Innumerable subcentimeter noncalcified lung nodules scattered throughout both lungs, consistent with metastatic disease. 2. 5.2 cm x 3.0 cm patchy area of masslike consolidation within the posteromedial aspect of the left upper lobe, which may represent a primary neoplasm. 3. Small to moderate sized patchy areas of right upper lobe, posterior right middle lobe and superior right lower lobe atelectasis and/or infiltrate. 4. Moderate size left pleural effusion, and small right pleural effusion. 5. Cholelithiasis without evidence of cholecystitis. 6. Moderate-sized hiatal hernia. 7. Sigmoid diverticulosis. Aortic Atherosclerosis (ICD10-I70.0). Electronically Signed   By: Virgina Norfolk M.D.   On: 08/24/2019 21:27   CT HEAD WO CONTRAST  Result Date: 08/25/2019 CLINICAL DATA:  Follow-up examination for acute stroke. EXAM: CT HEAD WITHOUT CONTRAST TECHNIQUE: Contiguous axial images were obtained from the base of the skull through the vertex without intravenous contrast. COMPARISON:  Prior CT and MRI from 08/24/2019. FINDINGS: Brain: Moderately advanced cerebral atrophy with chronic small vessel ischemic disease. Few scatter remote lacunar infarcts noted at the right basal ganglia and cerebellum. No acute intracranial hemorrhage. No visible acute or evolving large vessel territory infarct. Small intraventricular mass within the right lateral ventricle again noted, stable. No  hydrocephalus or ventricular trapping. No extra-axial fluid collection. Vascular: No hyperdense vessel. Calcified atherosclerosis present at skull base. Skull: Scalp soft tissues and calvarium within normal limits. Sinuses/Orbits: Globes and orbital soft tissues demonstrate no acute finding. Paranasal sinuses and mastoid air cells remain clear. Other: None. IMPRESSION: Stable head CT. No visible acute or evolving large vessel territory infarct. No other new intracranial abnormality. Electronically Signed   By: Jeannine Boga M.D.   On: 08/25/2019 06:30   CT CHEST WO CONTRAST  Result Date: 08/24/2019 CLINICAL DATA:  Left upper extremity weakness. EXAM: CT CHEST, ABDOMEN AND PELVIS WITHOUT CONTRAST TECHNIQUE: Multidetector CT imaging of the chest, abdomen and pelvis was performed following the standard protocol without IV contrast. COMPARISON:  None. FINDINGS: CT CHEST FINDINGS Cardiovascular: There is marked severity calcification of the aortic arch. Normal heart size. No pericardial effusion. Marked severity coronary artery calcification is seen. Mediastinum/Nodes: No enlarged mediastinal, hilar, or axillary lymph nodes. Thyroid gland, trachea, and esophagus demonstrate  no significant findings. Lungs/Pleura: Innumerable subcentimeter noncalcified lung nodules are seen scattered throughout both lungs. A 5.2 cm x 3.0 cm patchy area of masslike consolidation is seen within the posteromedial aspect of the left upper lobe. Small to moderate sized patchy areas of atelectasis and/or infiltrate are seen within the right upper lobe posteromedial right middle lobe and superior aspect of the right lower lobe. Mild atelectasis is seen along the posterior aspect of the left lung base. There is a moderate size left pleural effusion. A small right pleural effusion is also seen. No pneumothorax is identified. Musculoskeletal: Multiple sternal wires are seen. Degenerative changes seen within the thoracic spine. CT ABDOMEN  PELVIS FINDINGS Hepatobiliary: No focal liver abnormality is seen. Multiple subcentimeter gallstones are seen within the lumen of an otherwise normal-appearing gallbladder. There is no evidence of gallbladder wall thickening or biliary dilatation. Pancreas: Unremarkable. No pancreatic ductal dilatation or surrounding inflammatory changes. Spleen: Normal in size without focal abnormality. Adrenals/Urinary Tract: Adrenal glands are unremarkable. The left kidney is atrophic in appearance. A 1.3 cm cyst is seen within the lateral aspect of the mid left kidney. There is marked severity left-sided hydronephrosis and hydroureter with prominence of the intrarenal calices. The right kidney is normal in size, without renal calculi, focal lesion, or hydronephrosis. The urinary bladder is absent. Stomach/Bowel: There is a moderate-sized hiatal hernia. The appendix is not clearly identified. No evidence of bowel dilatation. Noninflamed diverticula are seen throughout the sigmoid colon. Vascular/Lymphatic: Marked severity aortic calcification. No enlarged abdominal or pelvic lymph nodes. Reproductive: The prostate gland is absent. Other: A right lower quadrant ostomy site is noted. No abdominopelvic ascites. Musculoskeletal: Moderate to marked severity multilevel degenerative changes seen throughout the lumbar spine. IMPRESSION: 1. Innumerable subcentimeter noncalcified lung nodules scattered throughout both lungs, consistent with metastatic disease. 2. 5.2 cm x 3.0 cm patchy area of masslike consolidation within the posteromedial aspect of the left upper lobe, which may represent a primary neoplasm. 3. Small to moderate sized patchy areas of right upper lobe, posterior right middle lobe and superior right lower lobe atelectasis and/or infiltrate. 4. Moderate size left pleural effusion, and small right pleural effusion. 5. Cholelithiasis without evidence of cholecystitis. 6. Moderate-sized hiatal hernia. 7. Sigmoid  diverticulosis. Aortic Atherosclerosis (ICD10-I70.0). Electronically Signed   By: Virgina Norfolk M.D.   On: 08/24/2019 21:25   CT CERVICAL SPINE WO CONTRAST  Addendum Date: 08/24/2019   ADDENDUM REPORT: 08/24/2019 19:17 ADDENDUM: Findings of innumerable nodules within the imaged lung apices and partially imaged left pleural effusion, and recommendation for additional dedicated CT imaging of the chest, called by telephone at the time of interpretation on 08/24/2019 at 7:16 pm to provider Karmen Bongo , who verbally acknowledged these results. Electronically Signed   By: Kellie Simmering DO   On: 08/24/2019 19:17   Result Date: 08/24/2019 CLINICAL DATA:  Neuro deficit, acute, stroke suspected. Additional provided: Left upper extremity weakness. EXAM: CT CERVICAL SPINE WITHOUT CONTRAST TECHNIQUE: Multidetector CT imaging of the cervical spine was performed without intravenous contrast. Multiplanar CT image reconstructions were also generated. COMPARISON:  No pertinent prior studies available for comparison. FINDINGS: Alignment: Trace C2-C3 grade retrolisthesis. 2 mm C4-C5 grade 1 anterolisthesis. Trace C5-C6 retrolisthesis. Skull base and vertebrae: The basion-dental and atlanto-dental intervals are maintained.No evidence of acute fracture to the cervical spine. Soft tissues and spinal canal: No prevertebral fluid or swelling. Disc levels: Multilevel disc space narrowing. Most notably, there is moderate/advanced disc space narrowing at C5-C6. Degenerative fusion across the C6-C7  disc space. Pannus formation posterior to the dens without significant appreciable spinal canal narrowing. C2-C3: No significant disc herniation. Facet arthrosis. Degenerative fusion across the right facet joint. No appreciable significant spinal canal stenosis or neural foraminal narrowing. C3-C4: Small disc bulge. Uncovertebral hypertrophy (greater on the right). Facet arthrosis. No appreciable significant spinal canal stenosis. Mild  right neural foraminal narrowing. C4-C5: Grade 1 anterolisthesis. Disc uncovering. Uncinate hypertrophy. Facet hypertrophy which is prominent on the right. Degenerative fusion of the facet joint on the right. No appreciable significant spinal canal stenosis. Mild/moderate right neural foraminal narrowing. C5-C6: Shallow posterior disc osteophyte complex. Uncovertebral hypertrophy. Prominent facet hypertrophy. No appreciable significant spinal canal stenosis or neural foraminal narrowing. C6-C7: Degenerative fusion across the disc space. Uncovertebral hypertrophy. Facet hypertrophy. Degenerative fusion across the facet joint on the right. No appreciable significant spinal canal stenosis. Mild left neural foraminal narrowing. C7-T1: Uncovertebral hypertrophy. Mild facet arthrosis. No appreciable significant spinal canal stenosis or neural foraminal narrowing. Upper chest: There are innumerable nodules within the imaged lung apices. Partially visualized left pleural effusion. Other: Carotid artery atherosclerotic calcification. IMPRESSION: Cervical spondylosis as outlined multilevel degenerative fusion. No appreciable significant spinal canal stenosis at any level. Mild/moderate C4-C5 right neural foraminal narrowing. Additional sites of mild neural foraminal narrowing as described. Multilevel grade 1 spondylolisthesis. There are innumerable nodules within the imaged lung apices. Partially imaged left pleural effusion. Dedicated contrast-enhanced chest CT is recommended for further evaluation. Electronically Signed: By: Kellie Simmering DO On: 08/24/2019 12:59   MR BRAIN WO CONTRAST  Result Date: 08/24/2019 CLINICAL DATA:  Focal neuro deficit, greater than 6 hours, stroke suspected. Additional history provided: Left-sided arm weakness, chest tightness, known atrial fibrillation. EXAM: MRI HEAD WITHOUT CONTRAST TECHNIQUE: Multiplanar, multiecho pulse sequences of the brain and surrounding structures were obtained  without intravenous contrast. COMPARISON:  Noncontrast head CT 08/24/2019 FINDINGS: Brain: Prominent susceptibility artifact arising from the face region obscures the portions of the right orbit, face and intracranial contents on multiple sequences. This limits evaluation. The susceptibility artifact appears to arise from a retained metallic foreign body within the forehead soft tissues as demonstrated on head CT performed earlier the same day. Due to marked susceptibility artifact the diffusion-weighted imaging is nondiagnostic and this precludes adequate evaluation for acute infarct. Redemonstrated 12 mm pedunculated mass within the body of the right lateral ventricle, arising from the lateral wall (series 7, image 16) (series 8, image 16). Mild patchy T2/FLAIR hyperintensity within the cerebral white matter is nonspecific, but consistent with chronic small vessel ischemic disease. Chronic lacunar infarcts within the right caudate head and within the cerebellum. Moderate generalized parenchymal atrophy. No chronic intracranial blood products are identified within described limitations. No extra-axial fluid collection is identified. No midline shift. Vascular: Expected proximal arterial flow voids. Skull and upper cervical spine: No focal marrow lesion is identified within described limitations. Sinuses/Orbits: No acute orbital abnormality identified. No significant paranasal sinus disease within described limitations. No significant mastoid effusion. IMPRESSION: Marked susceptibility artifact arising from a retained metallic foreign body within the face obscures the intracranial contents on multiple sequences and significantly limits evaluation. The susceptibility artifact renders the diffusion-weighted imaging non-diagnostic and this precludes adequate evaluation for acute infarct. Redemonstrated 12 mm pedunculated mass within the body of the right lateral ventricle. The mass is incompletely characterized on this  non-contrast study. Nonemergent contrast-enhanced brain MRI is recommended for further evaluation. Chronic lacunar infarcts within the right basal ganglia and within the cerebellum. Mild chronic small vessel ischemic disease within the  cerebral white matter. Moderate generalized parenchymal atrophy. Electronically Signed   By: Kellie Simmering DO   On: 08/24/2019 08:56   ECHOCARDIOGRAM COMPLETE  Result Date: 08/24/2019    ECHOCARDIOGRAM REPORT   Patient Name:   Massimiliano Rohleder. Date of Exam: 08/24/2019 Medical Rec #:  [id number]       Height:       71.0 in Accession #:    [id number]      Weight:       144.0 lb Date of Birth:  07-26-1931        BSA:          1.834 m Patient Age:    86 years        BP:           125/93 mmHg Patient Gender: M               HR:           85 bpm. Exam Location:  Inpatient Procedure: 2D Echo, Cardiac Doppler and Color Doppler Indications:    CVA  History:        Patient has prior history of Echocardiogram examinations, most                 recent 10/27/2013. CAD, Prior CABG, Carotid Disease and PAD,                 Aortic Valve Disease, Arrythmias:Atrial Fibrillation; Risk                 Factors:Hypertension and Dyslipidemia. Pleural effusion.  Sonographer:    Dustin Flock Referring Phys: [place]  Sonographer Comments: Technically difficult study due to poor echo windows. Image acquisition challenging due to respiratory motion and patient very thin. IMPRESSIONS  1. Left ventricular ejection fraction, by estimation, is 60 to 65%. The left ventricle has normal function. Left ventricular endocardial border not optimally defined to evaluate regional wall motion. There is moderate left ventricular hypertrophy. Left ventricular diastolic parameters are indeterminate.  2. Right ventricular systolic function is mildly reduced. The right ventricular size is normal. There is normal pulmonary artery systolic pressure. The estimated right ventricular systolic pressure is 81.4 mmHg.  3.  The mitral valve is normal in structure. Mild mitral valve regurgitation. No evidence of mitral stenosis.  4. Tricuspid valve regurgitation is moderate.  5. The aortic valve is severely calcified with severely reduced cusp mobility. Aortic valve regurgitation is not visualized. Mild to moderate aortic valve stenosis. Aortic valve mean gradient measures 17.0 mmHg. This is likely an underestimate of true severity due to suboptimal Doppler alignment and image quality.  6. The inferior vena cava is normal in size with greater than 50% respiratory variability, suggesting right atrial pressure of 3 mmHg. Conclusion(s)/Recommendation(s): No intracardiac source of embolism detected on this transthoracic study. A transesophageal echocardiogram is recommended to exclude cardiac source of embolism if clinically indicated. FINDINGS  Left Ventricle: Left ventricular ejection fraction, by estimation, is 60 to 65%. The left ventricle has normal function. Left ventricular endocardial border not optimally defined to evaluate regional wall motion. The left ventricular internal cavity size was normal in size. There is moderate left ventricular hypertrophy. Left ventricular diastolic parameters are indeterminate. Right Ventricle: The right ventricular size is normal. No increase in right ventricular wall thickness. Right ventricular systolic function is mildly reduced. There is normal pulmonary artery systolic pressure. The tricuspid regurgitant velocity is 2.51 m/s, and with an assumed right atrial pressure of 3  mmHg, the estimated right ventricular systolic pressure is 42.6 mmHg. Left Atrium: Left atrial size was normal in size. Right Atrium: Right atrial size was normal in size. Pericardium: There is no evidence of pericardial effusion. Mitral Valve: The mitral valve is normal in structure. Normal mobility of the mitral valve leaflets. Mild mitral valve regurgitation. No evidence of mitral valve stenosis. Tricuspid Valve: The  tricuspid valve is normal in structure. Tricuspid valve regurgitation is moderate. Aortic Valve: The aortic valve is abnormal. Aortic valve regurgitation is not visualized. Mild to moderate aortic stenosis is present. There is severe calcifcation of the aortic valve. Aortic valve mean gradient measures 17.0 mmHg. Aortic valve peak gradient measures 38.2 mmHg. Aortic valve area, by VTI measures 1.01 cm. Pulmonic Valve: The pulmonic valve was not well visualized. Pulmonic valve regurgitation is not visualized. Aorta: The aortic root is normal in size and structure. Venous: The inferior vena cava is normal in size with greater than 50% respiratory variability, suggesting right atrial pressure of 3 mmHg. IAS/Shunts: The interatrial septum was not well visualized.  LEFT VENTRICLE PLAX 2D LVIDd:         2.68 cm  Diastology LVIDs:         1.97 cm  LV e' lateral:   6.20 cm/s LV PW:         1.49 cm  LV E/e' lateral: 10.0 LV IVS:        1.42 cm  LV e' medial:    7.18 cm/s LVOT diam:     2.30 cm  LV E/e' medial:  8.6 LV SV:         54 LV SV Index:   29 LVOT Area:     4.15 cm  RIGHT VENTRICLE RV Basal diam:  2.83 cm RV S prime:     8.05 cm/s TAPSE (M-mode): 1.5 cm LEFT ATRIUM             Index       RIGHT ATRIUM           Index LA diam:        4.00 cm 2.18 cm/m  RA Area:     16.80 cm LA Vol (A2C):   59.7 ml 32.56 ml/m RA Volume:   44.60 ml  24.32 ml/m LA Vol (A4C):   30.8 ml 16.80 ml/m LA Biplane Vol: 44.1 ml 24.05 ml/m  AORTIC VALVE AV Area (Vmax):    0.97 cm AV Area (Vmean):   1.09 cm AV Area (VTI):     1.01 cm AV Vmax:           309.00 cm/s AV Vmean:          179.000 cm/s AV VTI:            0.534 m AV Peak Grad:      38.2 mmHg AV Mean Grad:      17.0 mmHg LVOT Vmax:         72.40 cm/s LVOT Vmean:        46.800 cm/s LVOT VTI:          0.130 m LVOT/AV VTI ratio: 0.24  AORTA Ao Root diam: 3.20 cm MITRAL VALVE               TRICUSPID VALVE MV Area (PHT): 3.17 cm    TR Peak grad:   25.2 mmHg MV Decel Time: 239 msec     TR Vmax:        251.00 cm/s MV E velocity: 62.10  cm/s                            SHUNTS                            Systemic VTI:  0.13 m                            Systemic Diam: 2.30 cm Cherlynn Kaiser MD Electronically signed by Cherlynn Kaiser MD Signature Date/Time: 08/24/2019/10:41:57 PM    Final    CT HEAD CODE STROKE WO CONTRAST  Result Date: 08/24/2019 CLINICAL DATA:  Code stroke.  Left arm weakness EXAM: CT HEAD WITHOUT CONTRAST TECHNIQUE: Contiguous axial images were obtained from the base of the skull through the vertex without intravenous contrast. COMPARISON:  None. FINDINGS: Brain: No evidence of acute infarction, hemorrhage, or hydrocephalus. Remote lacunar infarct at the right caudate head. Prominent extra-axial CSF spaces but no definite cortical mass effect for hygroma. There is an isodense mass the level of the body right lateral ventricle measuring 11 mm. Small likely remote right cerebellar infarct. Vascular: No hyperdense vessel or unexpected calcification. Skull: Normal. Negative for fracture or focal lesion. Sinuses/Orbits: No acute finding. Other: These results were communicated to Dr. Cheral Marker at [facility] 5/21/2021by text page via the [facility] messaging system. ASPECTS [facility] Stroke Program Early CT Score) - Ganglionic level infarction (caudate, lentiform nuclei, internal capsule, insula, M1-M3 cortex): 7 - Supraganglionic infarction (M4-M6 cortex): 3 Total score (0-10 with 10 being normal): 10 IMPRESSION: 1. No acute finding.  ASPECTS is 10. 2. 11 mm mass at the right lateral ventricle. 3. Remote appearing infarcts at the right caudate and cerebellum. Electronically Signed   By: Monte Fantasia M.D.   On: 08/24/2019 04:34   VAS US CAROTID  Result Date: 08/27/2019 Carotid Arterial Duplex Study Indications:  CVA. Risk Factors: Hypertension, hyperlipidemia. Performing Technologist: Maudry Mayhew MHA, RDMS, RVT, RDCS  Examination Guidelines: A complete evaluation includes B-mode  imaging, spectral Doppler, color Doppler, and power Doppler as needed of all accessible portions of each vessel. Bilateral testing is considered an integral part of a complete examination. Limited examinations for reoccurring indications may be performed as noted.  Right Carotid Findings: +----------+-------+-------+--------+---------------------------------+--------+           PSV    EDV    StenosisPlaque Description               Comments           cm/s   cm/s                                                     +----------+-------+-------+--------+---------------------------------+--------+ CCA Prox  69     17             smooth and homogeneous                    +----------+-------+-------+--------+---------------------------------+--------+ CCA Distal69     7              smooth, heterogenous and calcific         +----------+-------+-------+--------+---------------------------------+--------+ ICA Prox  177    30  heterogenous, irregular and                                               calcific                                  +----------+-------+-------+--------+---------------------------------+--------+ ICA Distal74     26                                                       +----------+-------+-------+--------+---------------------------------+--------+ ECA       220    21             heterogenous, irregular and                                               calcific                                  +----------+-------+-------+--------+---------------------------------+--------+ +----------+--------+-------+----------------+-------------------+           PSV cm/sEDV cmsDescribe        Arm Pressure (mmHG) +----------+--------+-------+----------------+-------------------+ [id number]             Multiphasic, WNL                    +----------+--------+-------+----------------+-------------------+  +---------+--------+--+--------+-+---------+ VertebralPSV cm/s25EDV cm/s8Antegrade +---------+--------+--+--------+-+---------+  Left Carotid Findings: +----------+-------+-------+--------+---------------------------------+--------+           PSV    EDV    StenosisPlaque Description               Comments           cm/s   cm/s                                                     +----------+-------+-------+--------+---------------------------------+--------+ CCA Prox  72     11             smooth and heterogenous                   +----------+-------+-------+--------+---------------------------------+--------+ CCA Distal52     12             calcific                                  +----------+-------+-------+--------+---------------------------------+--------+ ICA Prox  62     15             heterogenous, irregular and                                               calcific                                  +----------+-------+-------+--------+---------------------------------+--------+  ICA Distal88     13                                                       +----------+-------+-------+--------+---------------------------------+--------+ ECA       119    14             heterogenous and calcific                 +----------+-------+-------+--------+---------------------------------+--------+ +----------+--------+--------+----------------+-------------------+           PSV cm/sEDV cm/sDescribe        Arm Pressure (mmHG) +----------+--------+--------+----------------+-------------------+ [id number]              Multiphasic, WNL                    +----------+--------+--------+----------------+-------------------+ +---------+--------+--+--------+-+---------+ VertebralPSV cm/s34EDV cm/s7Antegrade +---------+--------+--+--------+-+---------+   Summary: Right Carotid: Velocities in the right ICA are consistent with a 1-39% stenosis. Left  Carotid: Velocities in the left ICA are consistent with a 1-39% stenosis. Vertebrals:  Bilateral vertebral arteries demonstrate antegrade flow. Subclavians: Normal flow hemodynamics were seen in bilateral subclavian              arteries. *See table(s) above for measurements and observations.  Electronically signed by Antony Contras MD on 08/27/2019 at 1:43:39 PM.    Final    VAS [place] TRANSCRANIAL DOPPLER  Result Date: 08/27/2019  Transcranial Doppler Indications: Stroke. Limitations: Poor acoustic windows Limitations for diagnostic windows: Unable to insonate right transtemporal window. Performing Technologist: Maudry Mayhew MHA, RDMS, RVT, RDCS  Examination Guidelines: A complete evaluation includes B-mode imaging, spectral Doppler, color Doppler, and power Doppler as needed of all accessible portions of each vessel. Bilateral testing is considered an integral part of a complete examination. Limited examinations for reoccurring indications may be performed as noted.  +----------+-------------+----------+-----------+------------------+ RIGHT TCD Right VM (cm)Depth (cm)Pulsatility     Comment       +----------+-------------+----------+-----------+------------------+ MCA                                         Unable to insonate +----------+-------------+----------+-----------+------------------+ ACA                                         Unable to insonate +----------+-------------+----------+-----------+------------------+ Term ICA                                    Unable to insonate +----------+-------------+----------+-----------+------------------+ PCA                                         Unable to insonate +----------+-------------+----------+-----------+------------------+ Opthalmic     15.00                 1.47                       +----------+-------------+----------+-----------+------------------+ ICA siphon    16.00  1.18                        +----------+-------------+----------+-----------+------------------+ Vertebral    -22.00                 1.10                       +----------+-------------+----------+-----------+------------------+  +----------+------------+----------+-----------+------------------+ LEFT TCD  Left VM (cm)Depth (cm)Pulsatility     Comment       +----------+------------+----------+-----------+------------------+ MCA          18.00                 1.48                       +----------+------------+----------+-----------+------------------+ ACA                                        Unable to insonate +----------+------------+----------+-----------+------------------+ Term ICA                                   Unable to insonate +----------+------------+----------+-----------+------------------+ PCA                                        Unable to insonate +----------+------------+----------+-----------+------------------+ Opthalmic                                  Unable to insonate +----------+------------+----------+-----------+------------------+ ICA siphon   14.00                 1.22                       +----------+------------+----------+-----------+------------------+ Vertebral    -16.00                0.89                       +----------+------------+----------+-----------+------------------+  +------------+-------+------------------+             VM cm/s     Comment       +------------+-------+------------------+ Prox Basilar       Unable to insonate +------------+-------+------------------+ Summary:  Absent right temporal and poor left temporal and suboccipital window limts evaluation.Antegrade flow in both opthalmics and carotid siphons. *See table(s) above for TCD measurements and observations.  Diagnosing physician: Antony Contras MD Electronically signed by Antony Contras MD on 08/27/2019 at 1:47:05 PM.    Final      Labs:   Basic Metabolic  Panel: Recent Labs  Lab 08/24/19 0422 08/24/19 0422 08/24/19 0431 08/25/19 0307  NA 137  --  136 134*  K 4.3   < > 4.4 4.2  CL 100  --  99 101  CO2 26  --   --  22  GLUCOSE 98  --  89 102*  BUN 37*  --  46* 34*  CREATININE 2.08*  --  1.80* 1.58*  CALCIUM 9.7  --   --  9.5  PHOS  --   --   --  2.9   < > = values in this  interval not displayed.   GFR Estimated Creatinine Clearance: 31 mL/min (A) (by C-G formula based on SCr of 1.58 mg/dL (H)). Liver Function Tests: Recent Labs  Lab 08/24/19 0422 08/25/19 0307  AST 17  --   ALT 8  --   ALKPHOS 70  --   BILITOT 1.5*  --   PROT 6.0*  --   ALBUMIN 2.5* 2.3*   No results for input(s): LIPASE, AMYLASE in the last 168 hours. No results for input(s): AMMONIA in the last 168 hours. Coagulation profile Recent Labs  Lab 08/24/19 0422  INR 1.2    CBC: Recent Labs  Lab 08/24/19 0422 08/24/19 0431 08/25/19 0307 08/27/19 0447 08/28/19 0637 08/29/19 0416 08/30/19 0230  WBC 12.7*   < > 12.8* 13.8* 13.7* 12.5* 12.9*  NEUTROABS 9.0*  --   --   --   --   --   --   HGB 14.5   < > 14.0 14.6 14.3 13.1 13.7  HCT 44.9   < > 43.0 44.9 44.9 40.5 43.0  MCV 91.3   < > 89.6 90.5 91.4 90.0 91.1  PLT 381   < > 344 320 320 305 296   < > = values in this interval not displayed.   Cardiac Enzymes: No results for input(s): CKTOTAL, CKMB, CKMBINDEX, TROPONINI in the last 168 hours. BNP: Invalid input(s): POCBNP CBG: No results for input(s): GLUCAP in the last 168 hours. D-Dimer No results for input(s): DDIMER in the last 72 hours. Hgb A1c No results for input(s): HGBA1C in the last 72 hours. Lipid Profile No results for input(s): CHOL, HDL, LDLCALC, TRIG, CHOLHDL, LDLDIRECT in the last 72 hours. Thyroid function studies No results for input(s): TSH, T4TOTAL, T3FREE, THYROIDAB in the last 72 hours.  Invalid input(s): FREET3 Anemia work up No results for input(s): VITAMINB12, FOLATE, FERRITIN, TIBC, IRON, RETICCTPCT in the last 72  hours. Microbiology Recent Results (from the past 240 hour(s))  SARS Coronavirus 2 by RT PCR (hospital order, performed in [facility] hospital lab) Nasopharyngeal Nasopharyngeal Swab     Status: None   Collection Time: 08/24/19  4:53 AM   Specimen: Nasopharyngeal Swab  Result Value Ref Range Status   SARS Coronavirus 2 NEGATIVE NEGATIVE Final    Comment: (NOTE) SARS-CoV-2 target nucleic acids are NOT DETECTED. The SARS-CoV-2 RNA is generally detectable in upper and lower respiratory specimens during the acute phase of infection. The lowest concentration of SARS-CoV-2 viral copies this assay can detect is 250 copies / mL. A negative result does not preclude SARS-CoV-2 infection and should not be used as the sole basis for treatment or other patient management decisions.  A negative result may occur with improper specimen collection / handling, submission of specimen other than nasopharyngeal swab, presence of viral mutation(s) within the areas targeted by this assay, and inadequate number of viral copies (<250 copies / mL). A negative result must be combined with clinical observations, patient history, and epidemiological information. Fact Sheet for Patients:   StrictlyIdeas.no Fact Sheet for Healthcare Providers: BankingDealers.co.za This test is not yet approved or cleared  by the [place] FDA and has been authorized for detection and/or diagnosis of SARS-CoV-2 by FDA under an Emergency Use Authorization (EUA).  This EUA will remain in effect (meaning this test can be used) for the duration of the COVID-19 declaration under Section 564(b)(1) of the Act, 21 U.S.C. section 360bbb-3(b)(1), unless the authorization is terminated or revoked sooner. Performed at [facility], [place]  [address], [place], [place] [address]   Gram stain     Status: None   Collection Time: 08/27/19 10:26 AM   Specimen: Pleura  Result Value Ref Range  Status   Specimen Description PLEURAL FLUID LEFT  Final   Special Requests NONE  Final   Gram Stain   Final    WBC PRESENT,BOTH PMN AND MONONUCLEAR NO ORGANISMS SEEN CYTOSPIN SMEAR Performed at [facility], [address], [place], [place] [address]    Report Status 08/27/2019 FINAL  Final  Culture, body fluid-bottle     Status: None (Preliminary result)   Collection Time: 08/27/19 10:26 AM   Specimen: Pleura  Result Value Ref Range Status   Specimen Description PLEURAL FLUID LEFT  Final   Special Requests NONE  Final   Culture   Final    NO GROWTH 3 DAYS Performed at [place] [address] [place], [place] [address]    Report Status PENDING  Incomplete      Signed: Barb Merino  [facility] 08/30/2019, 10:22 AM

## 2019-08-30 NOTE — Progress Notes (Signed)
Patient being discharged to inpatient hospice.  IV left in per hospice request.  Discharge instructions and prescription information placed in the packet for transport.  Patient to be transported by [facility].  Report given to hospice nurse.

## 2019-08-30 NOTE — TOC Transition Note (Signed)
Transition of Care [facility]) - CM/SW Discharge Note   Patient Details  Name: [name]. MRN: [id number] Date of Birth: 12/21/1931  [facility]) CM/SW Contact:  Pollie Friar, RN Phone Number: 08/30/2019, 2:28 PM   Clinical Narrative:    Pt is discharging to [facility]. [facility] will provide transportation. Facility asked for 3:30 pick up. Bedside RN updated and d/c packet at the desk.  Son aware of plan and timing.   Number for report: [phone]   Final next level of care: [place] Barriers to Discharge: No Barriers Identified   Patient Goals and CMS Choice   CMS Medicare.gov Compare Post Acute Care list provided to:: Patient Represenative (must comment) Choice offered to / list presented to : Adult Children  Discharge Placement                       Discharge Plan and Services                            [facility] Agency: [facility] and [place] Date [place]: 08/27/19   Representative spoke with at [place]: [place] ([place]) Interventions     Readmission Risk Interventions No flowsheet data found.

## 2019-08-30 NOTE — Plan of Care (Signed)
  Problem: Education: Goal: Knowledge of General Education information will improve Description: Including pain rating scale, medication(s)/side effects and non-pharmacologic comfort measures 08/30/2019 1541 by Caroll Rancher, RN Outcome: Adequate for Discharge 08/30/2019 1541 by Caroll Rancher, RN Outcome: Adequate for Discharge 08/30/2019 1129 by Caroll Rancher, RN Outcome: Progressing

## 2019-09-01 LAB — CULTURE, BODY FLUID W GRAM STAIN -BOTTLE: Culture: NO GROWTH

## 2019-09-07 ENCOUNTER — Telehealth: Payer: Self-pay | Admitting: Family Medicine

## 2019-09-11 ENCOUNTER — Other Ambulatory Visit: Payer: Medicare HMO

## 2019-10-04 NOTE — Telephone Encounter (Signed)
Couldn't get in touch with family via phone call.  Called and LMOVM at pt's home number offering condolences.  I was always glad to see this gentleman in clinic.  He was a kind and caring man and I will miss him.

## 2019-10-04 DEATH — deceased
